# Patient Record
Sex: Male | Born: 1945 | Race: White | Hispanic: No | Marital: Married | Smoking: Never smoker
Health system: Southern US, Community
[De-identification: ages and names within clinical notes are randomized; demographics above are authoritative.]

## PROBLEM LIST (undated history)

## (undated) DIAGNOSIS — M779 Enthesopathy, unspecified: Secondary | ICD-10-CM

## (undated) DIAGNOSIS — R319 Hematuria, unspecified: Secondary | ICD-10-CM

## (undated) DIAGNOSIS — N503 Cyst of epididymis: Secondary | ICD-10-CM

## (undated) DIAGNOSIS — N200 Calculus of kidney: Secondary | ICD-10-CM

## (undated) DIAGNOSIS — G43909 Migraine, unspecified, not intractable, without status migrainosus: Secondary | ICD-10-CM

## (undated) DIAGNOSIS — E7800 Pure hypercholesterolemia, unspecified: Secondary | ICD-10-CM

## (undated) DIAGNOSIS — Z87442 Personal history of urinary calculi: Secondary | ICD-10-CM

## (undated) DIAGNOSIS — I77810 Thoracic aortic ectasia: Secondary | ICD-10-CM

## (undated) DIAGNOSIS — I499 Cardiac arrhythmia, unspecified: Secondary | ICD-10-CM

## (undated) DIAGNOSIS — I4821 Permanent atrial fibrillation: Secondary | ICD-10-CM

## (undated) DIAGNOSIS — E119 Type 2 diabetes mellitus without complications: Secondary | ICD-10-CM

## (undated) DIAGNOSIS — G459 Transient cerebral ischemic attack, unspecified: Secondary | ICD-10-CM

## (undated) DIAGNOSIS — I2510 Atherosclerotic heart disease of native coronary artery without angina pectoris: Secondary | ICD-10-CM

## (undated) DIAGNOSIS — I639 Cerebral infarction, unspecified: Secondary | ICD-10-CM

## (undated) DIAGNOSIS — F419 Anxiety disorder, unspecified: Secondary | ICD-10-CM

## (undated) DIAGNOSIS — I10 Essential (primary) hypertension: Secondary | ICD-10-CM

## (undated) DIAGNOSIS — N400 Enlarged prostate without lower urinary tract symptoms: Secondary | ICD-10-CM

## (undated) DIAGNOSIS — H409 Unspecified glaucoma: Secondary | ICD-10-CM

## (undated) HISTORY — DX: Unspecified glaucoma: H40.9

## (undated) HISTORY — DX: Cyst of epididymis: N50.3

## (undated) HISTORY — DX: Permanent atrial fibrillation: I48.21

## (undated) HISTORY — DX: Benign prostatic hyperplasia without lower urinary tract symptoms: N40.0

## (undated) HISTORY — PX: TONSILLECTOMY: SUR1361

## (undated) HISTORY — DX: Atherosclerotic heart disease of native coronary artery without angina pectoris: I25.10

## (undated) HISTORY — DX: Pure hypercholesterolemia, unspecified: E78.00

## (undated) HISTORY — DX: Essential (primary) hypertension: I10

## (undated) HISTORY — PX: CARDIOVERSION: SHX1299

## (undated) HISTORY — DX: Thoracic aortic ectasia: I77.810

## (undated) HISTORY — DX: Enthesopathy, unspecified: M77.9

---

## 1999-11-05 ENCOUNTER — Encounter: Admission: RE | Admit: 1999-11-05 | Discharge: 2000-02-03 | Payer: Self-pay | Admitting: *Deleted

## 2001-09-23 ENCOUNTER — Encounter: Payer: Self-pay | Admitting: *Deleted

## 2001-09-23 ENCOUNTER — Ambulatory Visit (HOSPITAL_COMMUNITY): Admission: RE | Admit: 2001-09-23 | Discharge: 2001-09-23 | Payer: Self-pay | Admitting: *Deleted

## 2001-12-15 ENCOUNTER — Encounter: Payer: Self-pay | Admitting: *Deleted

## 2001-12-15 ENCOUNTER — Ambulatory Visit (HOSPITAL_COMMUNITY): Admission: RE | Admit: 2001-12-15 | Discharge: 2001-12-15 | Payer: Self-pay | Admitting: *Deleted

## 2003-03-26 DIAGNOSIS — N503 Cyst of epididymis: Secondary | ICD-10-CM

## 2003-03-26 HISTORY — DX: Cyst of epididymis: N50.3

## 2003-06-20 ENCOUNTER — Ambulatory Visit (HOSPITAL_COMMUNITY): Admission: RE | Admit: 2003-06-20 | Discharge: 2003-06-20 | Payer: Self-pay | Admitting: Family Medicine

## 2003-09-28 ENCOUNTER — Ambulatory Visit (HOSPITAL_COMMUNITY): Admission: RE | Admit: 2003-09-28 | Discharge: 2003-09-28 | Payer: Self-pay | Admitting: Gastroenterology

## 2009-03-25 DIAGNOSIS — I2510 Atherosclerotic heart disease of native coronary artery without angina pectoris: Secondary | ICD-10-CM

## 2009-03-25 HISTORY — DX: Atherosclerotic heart disease of native coronary artery without angina pectoris: I25.10

## 2009-05-24 ENCOUNTER — Inpatient Hospital Stay (HOSPITAL_COMMUNITY): Admission: EM | Admit: 2009-05-24 | Discharge: 2009-05-26 | Payer: Self-pay | Admitting: Emergency Medicine

## 2009-05-26 ENCOUNTER — Encounter (INDEPENDENT_AMBULATORY_CARE_PROVIDER_SITE_OTHER): Payer: Self-pay | Admitting: Internal Medicine

## 2009-09-08 ENCOUNTER — Ambulatory Visit (HOSPITAL_COMMUNITY): Admission: RE | Admit: 2009-09-08 | Discharge: 2009-09-08 | Payer: Self-pay | Admitting: Cardiology

## 2010-06-10 LAB — BASIC METABOLIC PANEL
CO2: 25 mEq/L (ref 19–32)
GFR calc non Af Amer: >60 mL/min (ref >60)
Potassium: 4.1 mEq/L (ref 3.5–5.1)

## 2010-06-10 LAB — CBC
MCHC: 34.2 g/dL (ref 30.0–36.0)
Platelets: 213 10*3/uL (ref 150–400)
RDW: 14.4 % (ref 11.5–15.5)
WBC: 6.3 10*3/uL (ref 4.0–10.5)

## 2010-06-10 LAB — PROTIME-INR: Prothrombin Time: 27.7 seconds — ABNORMAL HIGH (ref 11.6–15.2)

## 2010-06-10 LAB — MAGNESIUM: Magnesium: 2 mg/dL (ref 1.5–2.5)

## 2010-06-18 LAB — DIFFERENTIAL
Basophils Absolute: 0 10*3/uL (ref 0.0–0.1)
Basophils Relative: 1 % (ref 0–1)
Eosinophils Absolute: 0.1 10*3/uL (ref 0.0–0.7)
Eosinophils Relative: 1 % (ref 0–5)
Monocytes Absolute: 1.1 10*3/uL — ABNORMAL HIGH (ref 0.1–1.0)
Monocytes Relative: 18 % — ABNORMAL HIGH (ref 3–12)

## 2010-06-18 LAB — LIPID PANEL
HDL: 28 mg/dL — ABNORMAL LOW (ref >39)
Triglycerides: 88 mg/dL (ref <150)
VLDL: 18 mg/dL (ref 0–40)

## 2010-06-18 LAB — CBC
HCT: 52.1 % — ABNORMAL HIGH (ref 39.0–52.0)
Hemoglobin: 17.7 g/dL — ABNORMAL HIGH (ref 13.0–17.0)
MCV: 91.3 fL (ref 78.0–100.0)
MCV: 91.5 fL (ref 78.0–100.0)
RBC: 5.3 MIL/uL (ref 4.22–5.81)
RBC: 5.7 MIL/uL (ref 4.22–5.81)
RDW: 13.5 % (ref 11.5–15.5)
RDW: 13.8 % (ref 11.5–15.5)
WBC: 5.8 10*3/uL (ref 4.0–10.5)
WBC: 6.1 10*3/uL (ref 4.0–10.5)

## 2010-06-18 LAB — GLUCOSE, CAPILLARY
Glucose-Capillary: 111 mg/dL — ABNORMAL HIGH (ref 70–99)
Glucose-Capillary: 111 mg/dL — ABNORMAL HIGH (ref 70–99)
Glucose-Capillary: 163 mg/dL — ABNORMAL HIGH (ref 70–99)
Glucose-Capillary: 241 mg/dL — ABNORMAL HIGH (ref 70–99)

## 2010-06-18 LAB — COMPREHENSIVE METABOLIC PANEL
ALT: 37 U/L (ref 0–53)
AST: 32 U/L (ref 0–37)
Albumin: 3.3 g/dL — ABNORMAL LOW (ref 3.5–5.2)
CO2: 28 mEq/L (ref 19–32)
Chloride: 101 mEq/L (ref 96–112)
Creatinine, Ser: 0.98 mg/dL (ref 0.4–1.5)
Sodium: 137 mEq/L (ref 135–145)
Total Bilirubin: 0.6 mg/dL (ref 0.3–1.2)
Total Protein: 6.3 g/dL (ref 6.0–8.3)

## 2010-06-18 LAB — PROTIME-INR
INR: 1.06 (ref 0.00–1.49)
INR: 1.36 (ref 0.00–1.49)

## 2010-06-18 LAB — URINALYSIS, ROUTINE W REFLEX MICROSCOPIC
Hgb urine dipstick: NEGATIVE
Ketones, ur: NEGATIVE mg/dL
pH: 7 (ref 5.0–8.0)

## 2010-06-18 LAB — CK TOTAL AND CKMB (NOT AT ARMC)
CK, MB: 1.1 ng/mL (ref 0.3–4.0)
Relative Index: 0.6 (ref 0.0–2.5)
Relative Index: 0.6 (ref 0.0–2.5)
Relative Index: 0.6 (ref 0.0–2.5)

## 2010-06-18 LAB — TSH: TSH: 1.816 u[IU]/mL (ref 0.350–4.500)

## 2010-06-18 LAB — POCT CARDIAC MARKERS
CKMB, poc: 1.7 ng/mL (ref 1.0–8.0)
Myoglobin, poc: 110 ng/mL (ref 12–200)

## 2010-06-18 LAB — BASIC METABOLIC PANEL
BUN: 15 mg/dL (ref 6–23)
GFR calc Af Amer: >60 mL/min (ref >60)
GFR calc non Af Amer: >60 mL/min (ref >60)
Glucose, Bld: 156 mg/dL — ABNORMAL HIGH (ref 70–99)
Potassium: 4.2 mEq/L (ref 3.5–5.1)
Sodium: 133 mEq/L — ABNORMAL LOW (ref 135–145)

## 2010-06-18 LAB — D-DIMER, QUANTITATIVE: D-Dimer, Quant: 1.58 ug/mL-FEU — ABNORMAL HIGH (ref 0.00–0.48)

## 2010-06-18 LAB — TROPONIN I
Troponin I: 0.01 ng/mL (ref 0.00–0.06)
Troponin I: 0.02 ng/mL (ref 0.00–0.06)
Troponin I: <0.01 ng/mL (ref 0.00–0.06)

## 2010-06-18 LAB — URINE CULTURE

## 2010-06-18 LAB — HEMOGLOBIN A1C
Hgb A1c MFr Bld: 8.2 % — ABNORMAL HIGH (ref 4.6–6.1)
Mean Plasma Glucose: 189 mg/dL

## 2010-08-10 NOTE — Op Note (Signed)
NAME:  Norman [name], Norman [name]                            ACCOUNT NO.:  [id number]   MEDICAL RECORD NO.:  [id number]                   PATIENT TYPE:  AMB   LOCATION:  ENDO                                 FACILITY:  [facility]   PHYSICIAN:  James L. Malon Kindle., M.D.          DATE OF BIRTH:  09-18-1945   DATE OF PROCEDURE:  09/28/2003  DATE OF DISCHARGE:                                 OPERATIVE REPORT   PROCEDURE:  Colonoscopy.   MEDICATIONS:  Fentanyl 87.5 mcg, Versed 9 mg IV.   SCOPE:  Olympus pediatric colonoscope.   INDICATIONS FOR PROCEDURE:  Colon cancer screening.   DESCRIPTION OF PROCEDURE:  The procedure had been explained to the patient  and consent obtained. With the patient in the left lateral decubitus  position, the Olympus scope was inserted and advanced under direct  visualization. The prep was excellent. We were able to reach the cecum  without difficulty. The ileocecal valve and appendiceal orifice were seen.  The scope was withdrawn and the cecum, ascending colon, transverse colon,  splenic flexure, descending and sigmoid colon were seen well upon removal.  No polyps or other lesions were seen. The scope was withdrawn,  the rectum  was free of polyps. The patient tolerated the procedure well and was  maintained on low flow oxygen and pulse oximeter throughout the procedure.   ASSESSMENT:  Normal screening colonoscopy without any polyps, V76.51.   PLAN:  Yearly Hemoccults and will recommend repeating procedure in 10 years.                                               James L. Malon Kindle., M.D.    Waldron Session  D:  09/28/2003  T:  09/28/2003  Job:  [id number]   cc:   Holley Bouche, M.D.  [address]  [place], [place] [address]  Fax: [phone]

## 2013-01-15 ENCOUNTER — Ambulatory Visit (INDEPENDENT_AMBULATORY_CARE_PROVIDER_SITE_OTHER): Payer: 59 | Admitting: Pharmacist

## 2013-01-15 DIAGNOSIS — I4891 Unspecified atrial fibrillation: Secondary | ICD-10-CM

## 2013-01-15 LAB — POCT INR: INR: 2.2

## 2013-02-26 ENCOUNTER — Ambulatory Visit (INDEPENDENT_AMBULATORY_CARE_PROVIDER_SITE_OTHER): Payer: 59 | Admitting: *Deleted

## 2013-02-26 DIAGNOSIS — I4891 Unspecified atrial fibrillation: Secondary | ICD-10-CM

## 2013-04-05 ENCOUNTER — Encounter: Payer: Self-pay | Admitting: General Surgery

## 2013-04-05 DIAGNOSIS — I10 Essential (primary) hypertension: Secondary | ICD-10-CM

## 2013-04-09 ENCOUNTER — Ambulatory Visit (INDEPENDENT_AMBULATORY_CARE_PROVIDER_SITE_OTHER): Payer: 59

## 2013-04-09 DIAGNOSIS — I4891 Unspecified atrial fibrillation: Secondary | ICD-10-CM

## 2013-04-09 LAB — POCT INR: INR: 2.9

## 2013-04-23 ENCOUNTER — Ambulatory Visit (INDEPENDENT_AMBULATORY_CARE_PROVIDER_SITE_OTHER): Payer: 59 | Admitting: Cardiology

## 2013-04-23 ENCOUNTER — Encounter: Payer: Self-pay | Admitting: Cardiology

## 2013-04-23 VITALS — BP 140/86 | HR 56 | Ht 72.0 in | Wt 246.0 lb

## 2013-04-23 DIAGNOSIS — I10 Essential (primary) hypertension: Secondary | ICD-10-CM

## 2013-04-23 DIAGNOSIS — I4891 Unspecified atrial fibrillation: Secondary | ICD-10-CM

## 2013-04-23 NOTE — Patient Instructions (Signed)
Your physician recommends that you continue on your current medications as directed. Please refer to the Current Medication list given to you today.  Your physician wants you to follow-up in: 6 Months with Dr Turner You will receive a reminder letter in the mail two months in advance. If you don't receive a letter, please call our office to schedule the follow-up appointment.  

## 2013-04-23 NOTE — Progress Notes (Signed)
  [address], [place], [place]  [address] Phone: [phone] Fax:  [phone]  Date:  04/23/2013   ID:  ELDRA WORD, DOB [birth year], MRN [id number]  PCP:  Gara Kroner, MD  Cardiologist:  Fransico Him, MD     History of Present Illness: Norman [name] is a 68 y.o. male with a history of PAF and HTN who presents today for followup.  He is doing well.  He denies any chest pain, SOB, DOE, LE edema, dizziness, palpitation or syncope.   Wt Readings from Last 3 Encounters:  04/23/13 246 lb (111.585 kg)     Past Medical History  Diagnosis Date  . Diabetes mellitus without complication   . Hypercholesteremia   . Glaucoma   . Hypertension   . Tendinitis     History in Rotator cuff  . Epididymal cyst 2005    Bilateral and small bilateral hydroceles with most recent ultra sound  . Arthritis of right ankle   . Atrial fibrillation     Dr Radford Pax  . BPH (benign prostatic hypertrophy)     Mild    Current Outpatient Prescriptions  Medication Sig Dispense Refill  . atorvastatin (LIPITOR) 80 MG tablet Take 80 mg by mouth daily.      . bimatoprost (LUMIGAN) 0.03 % ophthalmic solution 1 drop at bedtime.      Marland Kitchen diltiazem (DILACOR XR) 240 MG 24 hr capsule Take 240 mg by mouth daily.      . metFORMIN (GLUCOPHAGE) 500 MG tablet Take 500 mg by mouth daily with breakfast.      . metoprolol tartrate (LOPRESSOR) 25 MG tablet Take 25 mg by mouth 2 (two) times daily.      Marland Kitchen omega-3 acid ethyl esters (LOVAZA) 1 G capsule Take 1 g by mouth 2 (two) times daily.      Marland Kitchen warfarin (COUMADIN) 5 MG tablet Take 5 mg by mouth as directed. Per PharmD      . zolpidem (AMBIEN CR) 12.5 MG CR tablet Take 12.5 mg by mouth at bedtime as needed for sleep.       No current facility-administered medications for this visit.    Allergies:   No Known Allergies  Social History:  The patient  reports that he has never smoked. He does not have any smokeless tobacco history on file. He reports that he drinks  alcohol. He reports that he does not use illicit drugs.   Family History:  The patient's family history is not on file.   ROS:  Please see the history of present illness.      All other systems reviewed and negative.   PHYSICAL EXAM: VS:  BP 140/86  Pulse 56  Ht 6' (1.829 m)  Wt 246 lb (111.585 kg)  BMI 33.36 kg/m2 Well nourished, well developed, in no acute distress HEENT: normal Neck: no JVD Cardiac:  normal S1, S2; RRR; no murmur Lungs:  clear to auscultation bilaterally, no wheezing, rhonchi or rales Abd: soft, nontender, no hepatomegaly Ext: no edema Skin: warm and dry Neuro:  CNs 2-12 intact, no focal abnormalities noted  EKG:  Sinus bradycardia      ASSESSMENT AND PLAN:  1. PAF maintaining sinus bradycardia  - continue Diltiazem/metoprolol/warfarin 2. HTN well controlled  - continue Diltiazem/metoprolol  Followup with me in 6 months  Signed, Fransico Him, MD 04/23/2013 9:01 AM

## 2013-05-07 ENCOUNTER — Ambulatory Visit (INDEPENDENT_AMBULATORY_CARE_PROVIDER_SITE_OTHER): Payer: 59 | Admitting: *Deleted

## 2013-05-07 DIAGNOSIS — I4891 Unspecified atrial fibrillation: Secondary | ICD-10-CM

## 2013-05-07 LAB — POCT INR: INR: 2.7

## 2013-05-14 ENCOUNTER — Ambulatory Visit (INDEPENDENT_AMBULATORY_CARE_PROVIDER_SITE_OTHER): Payer: 59 | Admitting: *Deleted

## 2013-05-14 DIAGNOSIS — I4891 Unspecified atrial fibrillation: Secondary | ICD-10-CM

## 2013-05-14 LAB — POCT INR: INR: 2.2

## 2013-06-25 ENCOUNTER — Ambulatory Visit (INDEPENDENT_AMBULATORY_CARE_PROVIDER_SITE_OTHER): Payer: 59 | Admitting: *Deleted

## 2013-06-25 DIAGNOSIS — Z5181 Encounter for therapeutic drug level monitoring: Secondary | ICD-10-CM

## 2013-06-25 DIAGNOSIS — I4891 Unspecified atrial fibrillation: Secondary | ICD-10-CM

## 2013-06-25 LAB — POCT INR: INR: 1.9

## 2013-08-03 ENCOUNTER — Telehealth: Payer: Self-pay | Admitting: *Deleted

## 2013-08-03 NOTE — Telephone Encounter (Signed)
Patient requests coumadin refill be sent to [facility]. Thanks, [name]

## 2013-08-05 ENCOUNTER — Other Ambulatory Visit: Payer: Self-pay | Admitting: *Deleted

## 2013-08-05 MED ORDER — WARFARIN SODIUM 5 MG PO TABS: ORAL_TABLET | ORAL | Status: DC

## 2013-08-05 NOTE — Telephone Encounter (Signed)
Refill done as requested 

## 2013-08-05 NOTE — Telephone Encounter (Signed)
Done by Elbert Ewings, RN

## 2013-08-06 ENCOUNTER — Ambulatory Visit (INDEPENDENT_AMBULATORY_CARE_PROVIDER_SITE_OTHER): Payer: 59 | Admitting: *Deleted

## 2013-08-06 DIAGNOSIS — I4891 Unspecified atrial fibrillation: Secondary | ICD-10-CM

## 2013-08-06 DIAGNOSIS — Z5181 Encounter for therapeutic drug level monitoring: Secondary | ICD-10-CM

## 2013-08-06 LAB — POCT INR: INR: 2.5

## 2013-08-06 MED ORDER — WARFARIN SODIUM 5 MG PO TABS: ORAL_TABLET | ORAL | Status: DC

## 2013-10-12 ENCOUNTER — Telehealth: Payer: Self-pay | Admitting: Cardiology

## 2013-10-12 NOTE — Telephone Encounter (Signed)
Received request from Nurse fax box, documents faxed for surgical clearance. [name] Fax number: [phone] Attention: [phone]/km

## 2013-11-12 ENCOUNTER — Ambulatory Visit (INDEPENDENT_AMBULATORY_CARE_PROVIDER_SITE_OTHER): Payer: 59 | Admitting: *Deleted

## 2013-11-12 DIAGNOSIS — Z5181 Encounter for therapeutic drug level monitoring: Secondary | ICD-10-CM

## 2013-11-12 DIAGNOSIS — I4891 Unspecified atrial fibrillation: Secondary | ICD-10-CM

## 2013-11-12 LAB — POCT INR: INR: 2.1

## 2013-11-22 ENCOUNTER — Encounter: Payer: Self-pay | Admitting: Cardiology

## 2013-12-24 ENCOUNTER — Ambulatory Visit (INDEPENDENT_AMBULATORY_CARE_PROVIDER_SITE_OTHER): Payer: 59 | Admitting: *Deleted

## 2013-12-24 DIAGNOSIS — I4891 Unspecified atrial fibrillation: Secondary | ICD-10-CM

## 2013-12-24 DIAGNOSIS — Z5181 Encounter for therapeutic drug level monitoring: Secondary | ICD-10-CM

## 2013-12-24 LAB — POCT INR: INR: 2

## 2013-12-28 ENCOUNTER — Encounter (HOSPITAL_COMMUNITY): Payer: Self-pay | Admitting: Emergency Medicine

## 2013-12-28 ENCOUNTER — Inpatient Hospital Stay (HOSPITAL_COMMUNITY)
Admission: EM | Admit: 2013-12-28 | Discharge: 2013-12-29 | DRG: 065 | Disposition: A | Discharge status: Home / Self Care | Payer: Medicare Other | Attending: Internal Medicine | Admitting: Internal Medicine

## 2013-12-28 ENCOUNTER — Other Ambulatory Visit: Payer: Self-pay

## 2013-12-28 ENCOUNTER — Emergency Department (HOSPITAL_COMMUNITY): Payer: Medicare Other

## 2013-12-28 ENCOUNTER — Observation Stay (HOSPITAL_COMMUNITY): Payer: Medicare Other

## 2013-12-28 DIAGNOSIS — I63419 Cerebral infarction due to embolism of unspecified middle cerebral artery: Secondary | ICD-10-CM | POA: Diagnosis not present

## 2013-12-28 DIAGNOSIS — Z7901 Long term (current) use of anticoagulants: Secondary | ICD-10-CM

## 2013-12-28 DIAGNOSIS — Z79899 Other long term (current) drug therapy: Secondary | ICD-10-CM

## 2013-12-28 DIAGNOSIS — E669 Obesity, unspecified: Secondary | ICD-10-CM | POA: Diagnosis present

## 2013-12-28 DIAGNOSIS — E119 Type 2 diabetes mellitus without complications: Secondary | ICD-10-CM | POA: Diagnosis present

## 2013-12-28 DIAGNOSIS — I639 Cerebral infarction, unspecified: Secondary | ICD-10-CM | POA: Diagnosis present

## 2013-12-28 DIAGNOSIS — Z6833 Body mass index (BMI) 33.0-33.9, adult: Secondary | ICD-10-CM

## 2013-12-28 DIAGNOSIS — G458 Other transient cerebral ischemic attacks and related syndromes: Secondary | ICD-10-CM

## 2013-12-28 DIAGNOSIS — I4891 Unspecified atrial fibrillation: Secondary | ICD-10-CM | POA: Diagnosis present

## 2013-12-28 DIAGNOSIS — G451 Carotid artery syndrome (hemispheric): Secondary | ICD-10-CM

## 2013-12-28 DIAGNOSIS — N400 Enlarged prostate without lower urinary tract symptoms: Secondary | ICD-10-CM | POA: Diagnosis present

## 2013-12-28 DIAGNOSIS — I482 Chronic atrial fibrillation, unspecified: Secondary | ICD-10-CM

## 2013-12-28 DIAGNOSIS — I6309 Cerebral infarction due to thrombosis of other precerebral artery: Secondary | ICD-10-CM

## 2013-12-28 DIAGNOSIS — G459 Transient cerebral ischemic attack, unspecified: Secondary | ICD-10-CM

## 2013-12-28 DIAGNOSIS — R531 Weakness: Secondary | ICD-10-CM

## 2013-12-28 DIAGNOSIS — I10 Essential (primary) hypertension: Secondary | ICD-10-CM | POA: Diagnosis present

## 2013-12-28 DIAGNOSIS — D45 Polycythemia vera: Secondary | ICD-10-CM | POA: Diagnosis present

## 2013-12-28 DIAGNOSIS — G8194 Hemiplegia, unspecified affecting left nondominant side: Secondary | ICD-10-CM | POA: Diagnosis present

## 2013-12-28 DIAGNOSIS — G4733 Obstructive sleep apnea (adult) (pediatric): Secondary | ICD-10-CM | POA: Diagnosis present

## 2013-12-28 DIAGNOSIS — E780 Pure hypercholesterolemia: Secondary | ICD-10-CM | POA: Diagnosis present

## 2013-12-28 HISTORY — DX: Transient cerebral ischemic attack, unspecified: G45.9

## 2013-12-28 HISTORY — DX: Type 2 diabetes mellitus without complications: E11.9

## 2013-12-28 HISTORY — DX: Migraine, unspecified, not intractable, without status migrainosus: G43.909

## 2013-12-28 HISTORY — DX: Calculus of kidney: N20.0

## 2013-12-28 LAB — COMPREHENSIVE METABOLIC PANEL
ALT: 22 U/L (ref 0–53)
ANION GAP: 11 (ref 5–15)
AST: 21 U/L (ref 0–37)
Albumin: 3.6 g/dL (ref 3.5–5.2)
Alkaline Phosphatase: 103 U/L (ref 39–117)
BUN: 23 mg/dL (ref 6–23)
CALCIUM: 9.5 mg/dL (ref 8.4–10.5)
CO2: 25 mEq/L (ref 19–32)
Chloride: 103 mEq/L (ref 96–112)
Creatinine, Ser: 0.98 mg/dL (ref 0.50–1.35)
GFR calc Af Amer: >90 mL/min (ref >90)
GFR calc non Af Amer: 81 mL/min — ABNORMAL LOW (ref >90)
Glucose, Bld: 111 mg/dL — ABNORMAL HIGH (ref 70–99)
Potassium: 4.8 mEq/L (ref 3.7–5.3)
Sodium: 139 mEq/L (ref 137–147)
Total Bilirubin: 0.4 mg/dL (ref 0.3–1.2)
Total Protein: 7 g/dL (ref 6.0–8.3)

## 2013-12-28 LAB — URINALYSIS, ROUTINE W REFLEX MICROSCOPIC
BILIRUBIN URINE: NEGATIVE
Glucose, UA: NEGATIVE mg/dL
Hgb urine dipstick: NEGATIVE
Ketones, ur: NEGATIVE mg/dL
Leukocytes, UA: NEGATIVE
Nitrite: NEGATIVE
PROTEIN: NEGATIVE mg/dL
SPECIFIC GRAVITY, URINE: 1.012 (ref 1.005–1.030)
UROBILINOGEN UA: 0.2 mg/dL (ref 0.0–1.0)
pH: 7 (ref 5.0–8.0)

## 2013-12-28 LAB — CBC
HEMATOCRIT: 50 % (ref 39.0–52.0)
Hemoglobin: 17.3 g/dL — ABNORMAL HIGH (ref 13.0–17.0)
MCH: 31.3 pg (ref 26.0–34.0)
MCHC: 34.6 g/dL (ref 30.0–36.0)
MCV: 90.4 fL (ref 78.0–100.0)
Platelets: 261 10*3/uL (ref 150–400)
RBC: 5.53 MIL/uL (ref 4.22–5.81)
RDW: 13 % (ref 11.5–15.5)
WBC: 9.1 10*3/uL (ref 4.0–10.5)

## 2013-12-28 LAB — DIFFERENTIAL
BASOS ABS: 0 10*3/uL (ref 0.0–0.1)
Basophils Relative: 0 % (ref 0–1)
EOS ABS: 0.1 10*3/uL (ref 0.0–0.7)
Eosinophils Relative: 1 % (ref 0–5)
Lymphocytes Relative: 27 % (ref 12–46)
Lymphs Abs: 2.5 10*3/uL (ref 0.7–4.0)
Monocytes Absolute: 0.9 10*3/uL (ref 0.1–1.0)
Monocytes Relative: 10 % (ref 3–12)
Neutro Abs: 5.6 10*3/uL (ref 1.7–7.7)
Neutrophils Relative %: 62 % (ref 43–77)

## 2013-12-28 LAB — I-STAT CHEM 8, ED
BUN: 28 mg/dL — ABNORMAL HIGH (ref 6–23)
Calcium, Ion: 1.15 mmol/L (ref 1.13–1.30)
Chloride: 107 mEq/L (ref 96–112)
Creatinine, Ser: 1 mg/dL (ref 0.50–1.35)
Glucose, Bld: 115 mg/dL — ABNORMAL HIGH (ref 70–99)
HEMATOCRIT: 54 % — AB (ref 39.0–52.0)
Hemoglobin: 18.4 g/dL — ABNORMAL HIGH (ref 13.0–17.0)
Potassium: 4.9 mEq/L (ref 3.7–5.3)
SODIUM: 138 meq/L (ref 137–147)
TCO2: 25 mmol/L (ref 0–100)

## 2013-12-28 LAB — RAPID URINE DRUG SCREEN, HOSP PERFORMED
AMPHETAMINES: NOT DETECTED
Barbiturates: NOT DETECTED
Benzodiazepines: NOT DETECTED
COCAINE: NOT DETECTED
Opiates: NOT DETECTED
TETRAHYDROCANNABINOL: NOT DETECTED

## 2013-12-28 LAB — PROTIME-INR
INR: 2.39 — ABNORMAL HIGH (ref 0.00–1.49)
Prothrombin Time: 26.1 seconds — ABNORMAL HIGH (ref 11.6–15.2)

## 2013-12-28 LAB — I-STAT TROPONIN, ED: TROPONIN I, POC: 0 ng/mL (ref 0.00–0.08)

## 2013-12-28 LAB — APTT: APTT: 44 s — AB (ref 24–37)

## 2013-12-28 LAB — GLUCOSE, CAPILLARY: Glucose-Capillary: 123 mg/dL — ABNORMAL HIGH (ref 70–99)

## 2013-12-28 LAB — ETHANOL

## 2013-12-28 MED ORDER — STROKE: EARLY STAGES OF RECOVERY BOOK
Freq: Once | Status: DC
  Filled 2013-12-28: qty 1

## 2013-12-28 MED ORDER — LATANOPROST 0.005 % OP SOLN
1.0000 [drp] | Freq: Every day | OPHTHALMIC | Status: DC
  Administered 2013-12-28: 1 [drp] via OPHTHALMIC
  Filled 2013-12-28: qty 2.5

## 2013-12-28 MED ORDER — INSULIN ASPART 100 UNIT/ML ~~LOC~~ SOLN: 0.0000 [IU] | Freq: Three times a day (TID) | SUBCUTANEOUS | Status: DC

## 2013-12-28 MED ORDER — SODIUM CHLORIDE 0.9 % IV BOLUS (SEPSIS)
500.0000 mL | Freq: Once | INTRAVENOUS | Status: AC
  Administered 2013-12-28: 500 mL via INTRAVENOUS

## 2013-12-28 MED ORDER — BRIMONIDINE TARTRATE 0.2 % OP SOLN
1.0000 [drp] | Freq: Two times a day (BID) | OPHTHALMIC | Status: DC
  Administered 2013-12-28 – 2013-12-29 (×2): 1 [drp] via OPHTHALMIC
  Filled 2013-12-28: qty 5

## 2013-12-28 MED ORDER — DILTIAZEM HCL ER 240 MG PO CP24
240.0000 mg | ORAL_CAPSULE | Freq: Every day | ORAL | Status: DC
  Administered 2013-12-29: 240 mg via ORAL
  Filled 2013-12-28: qty 1

## 2013-12-28 MED ORDER — TIMOLOL MALEATE 0.5 % OP SOLN
1.0000 [drp] | Freq: Two times a day (BID) | OPHTHALMIC | Status: DC
  Administered 2013-12-28 – 2013-12-29 (×2): 1 [drp] via OPHTHALMIC
  Filled 2013-12-28: qty 5

## 2013-12-28 MED ORDER — WARFARIN SODIUM 2.5 MG PO TABS: 2.5000 mg | ORAL_TABLET | Freq: Every day | ORAL | Status: DC

## 2013-12-28 MED ORDER — WARFARIN SODIUM 2.5 MG PO TABS: 2.5000 mg | ORAL_TABLET | ORAL | Status: DC

## 2013-12-28 MED ORDER — WARFARIN - PHYSICIAN DOSING INPATIENT: Freq: Every day | Status: DC

## 2013-12-28 MED ORDER — ATORVASTATIN CALCIUM 80 MG PO TABS
80.0000 mg | ORAL_TABLET | Freq: Every day | ORAL | Status: DC
  Administered 2013-12-28: 80 mg via ORAL
  Filled 2013-12-28 (×2): qty 1

## 2013-12-28 MED ORDER — OMEGA-3-ACID ETHYL ESTERS 1 G PO CAPS
1.0000 g | ORAL_CAPSULE | Freq: Two times a day (BID) | ORAL | Status: DC
  Administered 2013-12-28 – 2013-12-29 (×2): 1 g via ORAL
  Filled 2013-12-28 (×3): qty 1

## 2013-12-28 MED ORDER — SODIUM CHLORIDE 0.9 % IV SOLN
INTRAVENOUS | Status: DC
  Administered 2013-12-28: 1000 mL via INTRAVENOUS
  Administered 2013-12-28: 100 mL/h via INTRAVENOUS
  Administered 2013-12-28: 22:00:00 via INTRAVENOUS
  Administered 2013-12-29: 1000 mL via INTRAVENOUS

## 2013-12-28 MED ORDER — METOPROLOL TARTRATE 25 MG PO TABS
25.0000 mg | ORAL_TABLET | Freq: Two times a day (BID) | ORAL | Status: DC
  Administered 2013-12-28 – 2013-12-29 (×2): 25 mg via ORAL
  Filled 2013-12-28 (×3): qty 1

## 2013-12-28 MED ORDER — BRIMONIDINE TARTRATE-TIMOLOL 0.2-0.5 % OP SOLN
1.0000 [drp] | Freq: Two times a day (BID) | OPHTHALMIC | Status: DC
  Filled 2013-12-28: qty 5

## 2013-12-28 MED ORDER — WARFARIN SODIUM 5 MG PO TABS
5.0000 mg | ORAL_TABLET | ORAL | Status: DC
  Administered 2013-12-28: 5 mg via ORAL
  Filled 2013-12-28 (×2): qty 1

## 2013-12-28 NOTE — ED Notes (Signed)
Admitting MD at the bedside.  

## 2013-12-28 NOTE — Consult Note (Signed)
Referring Physician: Daleen Bo    Chief Complaint: left arm and leg weakness  HPI:                                                                                                                                         Norman [name] is an 68 y.o. male who went to sleep at 11:30 PM last night feeling well. He awoke at 0500 hours and when he tried to get out of bed he noted his left arm felt "rubbery and was unable to put weight on his left leg". This lasted for about one hour and before he felt it returned to baseline. Patient was brought to ED by wife concerned he suffered a stroke. He feels hs symptoms have resolved at this time.   Of note: Admitting labs show a Hg 18.4 and Hct 54  Date last known well: Date: 12/27/2013 Time last known well: Time: 23:30 tPA Given: No: resolving symptoms and INR 2.3  Past Medical History  Diagnosis Date  . Diabetes mellitus without complication   . Hypercholesteremia   . Glaucoma   . Hypertension   . Tendinitis     History in Rotator cuff  . Epididymal cyst 2005    Bilateral and small bilateral hydroceles with most recent ultra sound  . Arthritis of right ankle   . Atrial fibrillation     Dr Radford Pax  . BPH (benign prostatic hypertrophy)     Mild    History reviewed. No pertinent past surgical history.  History reviewed. No pertinent family history. Social History:  reports that he has never smoked. He does not have any smokeless tobacco history on file. He reports that he drinks alcohol. He reports that he does not use illicit drugs.  Allergies: No Known Allergies  Medications:                                                                                                                           No current facility-administered medications for this encounter.   Current Outpatient Prescriptions  Medication Sig Dispense Refill  . atorvastatin (LIPITOR) 80 MG tablet Take 80 mg by mouth daily.      . bimatoprost (LUMIGAN) 0.03 % ophthalmic  solution Place 1 drop into both eyes at bedtime.       . brimonidine-timolol (COMBIGAN) 0.2-0.5 % ophthalmic solution Place 1 drop  into both eyes every 12 (twelve) hours.      Marland Kitchen CINNAMON PO Take 1 capsule by mouth 2 (two) times daily.      Marland Kitchen diltiazem (DILACOR XR) 240 MG 24 hr capsule Take 240 mg by mouth daily.      . metFORMIN (GLUCOPHAGE) 500 MG tablet Take 500 mg by mouth daily with breakfast.      . metoprolol tartrate (LOPRESSOR) 25 MG tablet Take 25 mg by mouth 2 (two) times daily.      . Omega 3 1000 MG CAPS Take 1,000 mg by mouth 2 (two) times daily.      Marland Kitchen omega-3 acid ethyl esters (LOVAZA) 1 G capsule Take 1 g by mouth 2 (two) times daily.      Marland Kitchen warfarin (COUMADIN) 5 MG tablet Take 2.5-5 mg by mouth daily. 1/2 tablet (2.5 mg) on Mondays, Thursdays, and Saturdays; 1 tablet (5 mg) on all other days.      Marland Kitchen zolpidem (AMBIEN CR) 12.5 MG CR tablet Take 12.5 mg by mouth at bedtime as needed for sleep.         ROS:                                                                                                                                       History obtained from the patient  General ROS: negative for - chills, fatigue, fever, night sweats, weight gain or weight loss Psychological ROS: negative for - behavioral disorder, hallucinations, memory difficulties, mood swings or suicidal ideation Ophthalmic ROS: negative for - blurry vision, double vision, eye pain or loss of vision ENT ROS: negative for - epistaxis, nasal discharge, oral lesions, sore throat, tinnitus or vertigo Allergy and Immunology ROS: negative for - hives or itchy/watery eyes Hematological and Lymphatic ROS: negative for - bleeding problems, bruising or swollen lymph nodes Endocrine ROS: negative for - galactorrhea, hair pattern changes, polydipsia/polyuria or temperature intolerance Respiratory ROS: negative for - cough, hemoptysis, shortness of breath or wheezing Cardiovascular ROS: negative for - chest pain,  dyspnea on exertion, edema or irregular heartbeat Gastrointestinal ROS: negative for - abdominal pain, diarrhea, hematemesis, nausea/vomiting or stool incontinence Genito-Urinary ROS: negative for - dysuria, hematuria, incontinence or urinary frequency/urgency Musculoskeletal ROS: negative for - joint swelling or muscular weakness Neurological ROS: as noted in HPI Dermatological ROS: negative for rash and skin lesion changes  Neurologic Examination:  Blood pressure 135/67, pulse 68, temperature 97.8 F (36.6 C), temperature source Oral, resp. rate 18, SpO2 95.00%.   General: NAD Mental Status: Alert, oriented, thought content appropriate.  Speech fluent without evidence of aphasia.  Able to follow 3 step commands without difficulty. Cranial Nerves: II: Discs flat bilaterally; Visual fields grossly normal, pupils equal, round, reactive to light and accommodation III,IV, VI: ptosis not present, extra-ocular motions intact bilaterally V,VII: smile symmetric, facial light touch sensation normal bilaterally VIII: hearing normal bilaterally IX,X: gag reflex present XI: bilateral shoulder shrug XII: midline tongue extension without atrophy or fasciculations  Motor: Right : Upper extremity   5/5    Left:     Upper extremity   4/5  Lower extremity   5/5     Lower extremity   4/5 --pronator drift noted on the left UE Tone and bulk:normal tone throughout; no atrophy noted Sensory: Pinprick and light touch intact throughout, bilaterally Deep Tendon Reflexes:  Right: Upper Extremity   Left: Upper extremity   biceps (C-5 to C-6) 2/4   biceps (C-5 to C-6) 2/4 tricep (C7) 2/4    triceps (C7) 2/4 Brachioradialis (C6) 2/4  Brachioradialis (C6) 2/4  Lower Extremity Lower Extremity  quadriceps (L-2 to L-4) 2/4   quadriceps (L-2 to L-4) 2/4 Achilles (S1) 1/4   Achilles (S1) 1/4  Plantars: Right:  downgoing   Left: downgoing Cerebellar: Dysmetria left finger-to-nose,  Mild dysmetria left heel-to-shin test Gait: stable with no drifting or abnormal gait.  CV: pulses palpable throughout    Lab Results: Basic Metabolic Panel:  Recent Labs Lab 12/28/13 1004 12/28/13 1018  NA 139 138  K 4.8 4.9  CL 103 107  CO2 25  --   GLUCOSE 111* 115*  BUN 23 28*  CREATININE 0.98 1.00  CALCIUM 9.5  --     Liver Function Tests:  Recent Labs Lab 12/28/13 1004  AST 21  ALT 22  ALKPHOS 103  BILITOT 0.4  PROT 7.0  ALBUMIN 3.6   No results found for this basename: LIPASE, AMYLASE,  in the last 168 hours No results found for this basename: AMMONIA,  in the last 168 hours  CBC:  Recent Labs Lab 12/28/13 1004 12/28/13 1018  WBC 9.1  --   NEUTROABS 5.6  --   HGB 17.3* 18.4*  HCT 50.0 54.0*  MCV 90.4  --   PLT 261  --     Cardiac Enzymes: No results found for this basename: CKTOTAL, CKMB, CKMBINDEX, TROPONINI,  in the last 168 hours  Lipid Panel: No results found for this basename: CHOL, TRIG, HDL, CHOLHDL, VLDL, LDLCALC,  in the last 168 hours  CBG: No results found for this basename: GLUCAP,  in the last 168 hours  Microbiology: Results for orders placed during the hospital encounter of 05/24/09  URINE CULTURE     Status: None   Collection Time    05/24/09  3:39 PM      Result Value Ref Range Status   Specimen Description URINE, RANDOM   Final   Special Requests NONE   Final   Colony Count 7,000 COLONIES/ML   Final   Culture INSIGNIFICANT GROWTH   Final   Report Status 05/26/2009 FINAL   Final    Coagulation Studies:  Recent Labs  12/28/13 1004  LABPROT 26.1*  INR 2.39*    Imaging: Ct Head Wo Contrast  12/28/2013   CLINICAL DATA:  Transient left-sided weakness upon awakening earlier today ; partial resolution since earlier  in the day  EXAM: CT HEAD WITHOUT CONTRAST  TECHNIQUE: Contiguous axial images were obtained from the base of the skull through the  vertex without intravenous contrast.  COMPARISON:  None.  FINDINGS: There is age related volume loss. There is no demonstrable mass, hemorrhage, extra-axial fluid collection, or midline shift. There is minimal small vessel disease in the centra semiovale bilaterally. Elsewhere gray-white compartments appear normal. There is no demonstrable acute infarct. Bony calvarium appears intact. The mastoid air cells are clear. Each middle cerebral artery shows some increased attenuation ; this finding is more notable on the right than on the left. Significance of this finding is uncertain, particularly given the history of resolving weakness on the left.  IMPRESSION: No intracranial mass or hemorrhage. No acute appearing infarct demonstrable. Slight small vessel disease in the periventricular white matter. Age related volume loss is present. There is slightly more notable increased attenuation in the right middle cerebral artery compared to the left. This finding has been associated with early infarct in the right middle cerebral artery distribution. The significance of this finding in light of the clinical symptoms, however, must be viewed as questionable.   Electronically Signed   By: Lowella Grip M.D.   On: 12/28/2013 11:44       Assessment and plan discussed with with attending physician and they are in agreement.    Etta Quill PA-C [facility] [phone]  12/28/2013, 12:51 PM   Assessment: 68 y.o. male who awoke with left arm and leg weakness.  Symptoms resolved within one hour.  Current exam shows only left UE pronator drift. CT head negative for acute infarct or bleed. Patient currently on coumadin with therapeutic INR. Likely does have stroke with left sided deficits. His symptoms are mild and he is outside the window for intervention.   Stroke Risk Factors - atrial fibrillation, diabetes mellitus, hyperlipidemia and hypertension  1. HgbA1c, fasting lipid panel 2. MRI, MRA  of the  brain without contrast 3. PT consult, OT consult, Speech consult 4. Echocardiogram 5. Carotid dopplers 6. Prophylactic therapy-coumadin 7. Risk factor modification 8. Telemetry monitoring 9. Frequent neuro checks  Roland Rack, MD [facility] [phone]  If 7pm- 7am, please page neurology on call as listed in [place].

## 2013-12-28 NOTE — ED Notes (Signed)
Pt c/o left sided weakness upon waking that is now resolving; pt sts hx of afib and feels like may have irregular HR today; pt on coumadin; pt denies pain; pt sts some dizziness today

## 2013-12-28 NOTE — H&P (Signed)
[facility] History and Physical  WHALEN TROMPETER KDT:[id number] DOB: Oct 21, 1945 DOA: 12/28/2013  Referring physician:  PCP: Gara Kroner, MD  Specialists:   Chief Complaint: L sided weakness   HPI: Norman [name] is a 68 y.o. male with PMH of HTN, DM, A fib on AC presented with L sided weakness; Patient reports that he awakened at 5 AM with L sided weakness, he could not walk because of the weakness. His symptoms have resolved within 1 hour. He denies paraesthesia, no change in speech, or hearing, no vertigo, denies having similar symptoms in the past; denies chest pain, no SOB, no cough, no fever, no nausea, vomiting    Review of Systems: The patient denies anorexia, fever, weight loss,, vision loss, decreased hearing, hoarseness, chest pain, syncope, dyspnea on exertion, peripheral edema, balance deficits, hemoptysis, abdominal pain, melena, hematochezia, severe indigestion/heartburn, hematuria, incontinence, genital sores, muscle weakness, suspicious skin lesions, transient blindness, difficulty walking, depression, unusual weight change, abnormal bleeding, enlarged lymph nodes, angioedema, and breast masses.    Past Medical History  Diagnosis Date  . Diabetes mellitus without complication   . Hypercholesteremia   . Glaucoma   . Hypertension   . Tendinitis     History in Rotator cuff  . Epididymal cyst 2005    Bilateral and small bilateral hydroceles with most recent ultra sound  . Arthritis of right ankle   . Atrial fibrillation     Dr Radford Pax  . BPH (benign prostatic hypertrophy)     Mild   History reviewed. No pertinent past surgical history. Social History:  reports that he has never smoked. He does not have any smokeless tobacco history on file. He reports that he drinks alcohol. He reports that he does not use illicit drugs. where does patient live--home, ALF, SNF? and with whom if at home? Yes;  Can patient participate in ADLs?  No Known Allergies  History reviewed.  No pertinent family history.  (be sure to complete)  Prior to Admission medications   Medication Sig Start Date End Date Taking? Authorizing Provider  atorvastatin (LIPITOR) 80 MG tablet Take 80 mg by mouth daily.   Yes Historical Provider, MD  bimatoprost (LUMIGAN) 0.03 % ophthalmic solution Place 1 drop into both eyes at bedtime.    Yes Historical Provider, MD  brimonidine-timolol (COMBIGAN) 0.2-0.5 % ophthalmic solution Place 1 drop into both eyes every 12 (twelve) hours.   Yes Historical Provider, MD  CINNAMON PO Take 1 capsule by mouth 2 (two) times daily.   Yes Historical Provider, MD  diltiazem (DILACOR XR) 240 MG 24 hr capsule Take 240 mg by mouth daily.   Yes Historical Provider, MD  metFORMIN (GLUCOPHAGE) 500 MG tablet Take 500 mg by mouth daily with breakfast.   Yes Historical Provider, MD  metoprolol tartrate (LOPRESSOR) 25 MG tablet Take 25 mg by mouth 2 (two) times daily.   Yes Historical Provider, MD  Omega 3 1000 MG CAPS Take 1,000 mg by mouth 2 (two) times daily.   Yes Historical Provider, MD  omega-3 acid ethyl esters (LOVAZA) 1 G capsule Take 1 g by mouth 2 (two) times daily.   Yes Historical Provider, MD  warfarin (COUMADIN) 5 MG tablet Take 2.5-5 mg by mouth daily. 1/2 tablet (2.5 mg) on Mondays, Thursdays, and Saturdays; 1 tablet (5 mg) on all other days.   Yes Historical Provider, MD  zolpidem (AMBIEN CR) 12.5 MG CR tablet Take 12.5 mg by mouth at bedtime as needed for sleep.   Yes  Historical Provider, MD   Physical Exam: Filed Vitals:   12/28/13 1228  BP: 132/73  Pulse:   Temp:   Resp: 16     General:  alert  Eyes: eom-i, conjunctival erythema   ENT: no oral ulcers   Neck: supple   Cardiovascular: s1,s2 irregular   Respiratory: CTA BL  Abdomen: soft, nt,nd   Skin: no rash   Musculoskeletal: no LE edeam  Psychiatric: no hallucinations   Neurologic: AAAx3; CN 2-12 intact; motor 5/5 BL  Labs on Admission:  Basic Metabolic Panel:  Recent  Labs Lab 12/28/13 1004 12/28/13 1018  NA 139 138  K 4.8 4.9  CL 103 107  CO2 25  --   GLUCOSE 111* 115*  BUN 23 28*  CREATININE 0.98 1.00  CALCIUM 9.5  --    Liver Function Tests:  Recent Labs Lab 12/28/13 1004  AST 21  ALT 22  ALKPHOS 103  BILITOT 0.4  PROT 7.0  ALBUMIN 3.6   No results found for this basename: LIPASE, AMYLASE,  in the last 168 hours No results found for this basename: AMMONIA,  in the last 168 hours CBC:  Recent Labs Lab 12/28/13 1004 12/28/13 1018  WBC 9.1  --   NEUTROABS 5.6  --   HGB 17.3* 18.4*  HCT 50.0 54.0*  MCV 90.4  --   PLT 261  --    Cardiac Enzymes: No results found for this basename: CKTOTAL, CKMB, CKMBINDEX, TROPONINI,  in the last 168 hours  BNP (last 3 results) No results found for this basename: PROBNP,  in the last 8760 hours CBG: No results found for this basename: GLUCAP,  in the last 168 hours  Radiological Exams on Admission: Ct Head Wo Contrast  12/28/2013   CLINICAL DATA:  Transient left-sided weakness upon awakening earlier today ; partial resolution since earlier in the day  EXAM: CT HEAD WITHOUT CONTRAST  TECHNIQUE: Contiguous axial images were obtained from the base of the skull through the vertex without intravenous contrast.  COMPARISON:  None.  FINDINGS: There is age related volume loss. There is no demonstrable mass, hemorrhage, extra-axial fluid collection, or midline shift. There is minimal small vessel disease in the centra semiovale bilaterally. Elsewhere gray-white compartments appear normal. There is no demonstrable acute infarct. Bony calvarium appears intact. The mastoid air cells are clear. Each middle cerebral artery shows some increased attenuation ; this finding is more notable on the right than on the left. Significance of this finding is uncertain, particularly given the history of resolving weakness on the left.  IMPRESSION: No intracranial mass or hemorrhage. No acute appearing infarct demonstrable.  Slight small vessel disease in the periventricular white matter. Age related volume loss is present. There is slightly more notable increased attenuation in the right middle cerebral artery compared to the left. This finding has been associated with early infarct in the right middle cerebral artery distribution. The significance of this finding in light of the clinical symptoms, however, must be viewed as questionable.   Electronically Signed   By: Lowella Grip M.D.   On: 12/28/2013 11:44    EKG: Independently reviewed.   Assessment/Plan Principal Problem:   TIA (transient ischemic attack) Active Problems:   Atrial fibrillation   Essential hypertension, benign   68 y.o. male with PMH of HTN, A fib on AC presented with L sided weakness  1. L sided transient hemiplegia-resolved; TIA vs CVA with a fib; but INR therapeutic -CT:  increased attenuation in the right  middle cerebral artery compared to the left. This finding has been associated with early infarct in the right middle cerebral artery distribution  -neuro exam non focal;  -obtain TIA/CVA work up; neurology consultation; check jack 2, epo r/o polycytemia   2. HTN; hold BP meds with suspected TIA 3. DM, check HA1c; hold metformin;cont ISS 4. HPL, on statin  5. Elevated Hg/Hct of unclear etiology; no h/o tobacco use; check EPO, Jack 2; may need outpatient BM biopsy 6. A fib; HR controlled; cont Cardizem, BB, coumadin;    Neurology; if consultant consulted, please document name and whether formally or informally consulted  Code Status:  full (must indicate code status--if unknown or must be presumed, indicate so) Family Communication: d/wpatient, his wife (indicate person spoken with, if applicable, with phone number if by telephone) Disposition Plan: home pen clinical improvement   (indicate anticipated LOS)  Time spent: >35 minutes   Kinnie Feil [facility] Pager [phone]  If 7PM-7AM, please contact  night-coverage [email or web address] Password [email or web address] 12/28/2013, 12:31 PM

## 2013-12-28 NOTE — ED Notes (Signed)
LSN: [id number] 12/27/13

## 2013-12-28 NOTE — ED Notes (Signed)
Patient ordered tray

## 2013-12-28 NOTE — ED Provider Notes (Addendum)
CSN: [id number]     Arrival date & time 12/28/13  8527 History   First MD Initiated Contact with Patient 12/28/13 [phone]     Chief Complaint  Patient presents with  . Weakness  . Transient Ischemic Attack     (Consider location/radiation/quality/duration/timing/severity/associated sxs/prior Treatment) HPI Comments: Patient presents to ER for evaluation of left-sided weakness. Patient reports that he awakened at 5 AM, tried to get out of bed and noticed that his left arm and left leg were not working properly. He reports that his leg would give out on him. He could not walk because of the weakness. Patient does report that his symptoms have progressively improved since he first noticed.  Patient is a 68 y.o. male presenting with weakness.  Weakness    Past Medical History  Diagnosis Date  . Diabetes mellitus without complication   . Hypercholesteremia   . Glaucoma   . Hypertension   . Tendinitis     History in Rotator cuff  . Epididymal cyst 2005    Bilateral and small bilateral hydroceles with most recent ultra sound  . Arthritis of right ankle   . Atrial fibrillation     Dr Radford Pax  . BPH (benign prostatic hypertrophy)     Mild   History reviewed. No pertinent past surgical history. History reviewed. No pertinent family history. History  Substance Use Topics  . Smoking status: Never Smoker   . Smokeless tobacco: Not on file  . Alcohol Use: Yes     Comment: occasionally 2 glasses of wine a month    Review of Systems  Neurological: Positive for weakness (Left arm and leg).  All other systems reviewed and are negative.     Allergies  Review of patient's allergies indicates no known allergies.  Home Medications   Prior to Admission medications   Medication Sig Start Date End Date Taking? Authorizing Provider  atorvastatin (LIPITOR) 80 MG tablet Take 80 mg by mouth daily.   Yes Historical Provider, MD  bimatoprost (LUMIGAN) 0.03 % ophthalmic solution Place 1 drop  into both eyes at bedtime.    Yes Historical Provider, MD  brimonidine-timolol (COMBIGAN) 0.2-0.5 % ophthalmic solution Place 1 drop into both eyes every 12 (twelve) hours.   Yes Historical Provider, MD  CINNAMON PO Take 1 capsule by mouth 2 (two) times daily.   Yes Historical Provider, MD  diltiazem (DILACOR XR) 240 MG 24 hr capsule Take 240 mg by mouth daily.   Yes Historical Provider, MD  metFORMIN (GLUCOPHAGE) 500 MG tablet Take 500 mg by mouth daily with breakfast.   Yes Historical Provider, MD  metoprolol tartrate (LOPRESSOR) 25 MG tablet Take 25 mg by mouth 2 (two) times daily.   Yes Historical Provider, MD  Omega 3 1000 MG CAPS Take 1,000 mg by mouth 2 (two) times daily.   Yes Historical Provider, MD  omega-3 acid ethyl esters (LOVAZA) 1 G capsule Take 1 g by mouth 2 (two) times daily.   Yes Historical Provider, MD  warfarin (COUMADIN) 5 MG tablet Take 2.5-5 mg by mouth daily. 1/2 tablet (2.5 mg) on Mondays, Thursdays, and Saturdays; 1 tablet (5 mg) on all other days.   Yes Historical Provider, MD  zolpidem (AMBIEN CR) 12.5 MG CR tablet Take 12.5 mg by mouth at bedtime as needed for sleep.   Yes Historical Provider, MD   BP 118/77  Pulse 66  Temp(Src) 97.8 F (36.6 C) (Oral)  Resp 19  SpO2 98% Physical Exam  Constitutional: He is  oriented to person, place, and time. He appears well-developed and well-nourished. No distress.  HENT:  Head: Normocephalic and atraumatic.  Right Ear: Hearing normal.  Left Ear: Hearing normal.  Nose: Nose normal.  Mouth/Throat: Oropharynx is clear and moist and mucous membranes are normal.  Eyes: Conjunctivae and EOM are normal. Pupils are equal, round, and reactive to light.  Neck: Normal range of motion. Neck supple.  Cardiovascular: Regular rhythm, S1 normal and S2 normal.  Exam reveals no gallop and no friction rub.   No murmur heard. Pulmonary/Chest: Effort normal and breath sounds normal. No respiratory distress. He exhibits no tenderness.   Abdominal: Soft. Normal appearance and bowel sounds are normal. There is no hepatosplenomegaly. There is no tenderness. There is no rebound, no guarding, no tenderness at McBurney's point and negative Murphy's sign. No hernia.  Musculoskeletal: Normal range of motion.  Neurological: He is alert and oriented to person, place, and time. He has normal strength. No cranial nerve deficit or sensory deficit. He exhibits abnormal muscle tone. Coordination abnormal. GCS eye subscore is 4. GCS verbal subscore is 5. GCS motor subscore is 6.  Extraocular muscle movement: normal No visual field cut Pupils: equal and reactive both direct and consensual response is normal No nystagmus present    Sensory function is intact to light touch, pinprick Proprioception intact  Grip strength 5/5 symmetric in upper extremities + Pronator drift on the left  Left Lower extremity strength 4+/5 against resistance Right Lower extremity strength 5/5 against resistance  Normal finger to nose bilaterally Slight difficulty with heel to shin on left, normal on right   Skin: Skin is warm, dry and intact. No rash noted. No cyanosis.  Psychiatric: He has a normal mood and affect. His speech is normal and behavior is normal. Thought content normal.    ED Course  Procedures (including critical care time) Labs Review Labs Reviewed  PROTIME-INR - Abnormal; Notable for the following:    Prothrombin Time 26.1 (*)    INR 2.39 (*)    All other components within normal limits  APTT - Abnormal; Notable for the following:    aPTT 44 (*)    All other components within normal limits  CBC - Abnormal; Notable for the following:    Hemoglobin 17.3 (*)    All other components within normal limits  COMPREHENSIVE METABOLIC PANEL - Abnormal; Notable for the following:    Glucose, Bld 111 (*)    GFR calc non Af Amer 81 (*)    All other components within normal limits  I-STAT CHEM 8, ED - Abnormal; Notable for the following:     BUN 28 (*)    Glucose, Bld 115 (*)    Hemoglobin 18.4 (*)    HCT 54.0 (*)    All other components within normal limits  ETHANOL  DIFFERENTIAL  URINE RAPID DRUG SCREEN (HOSP PERFORMED)  URINALYSIS, ROUTINE W REFLEX MICROSCOPIC  I-STAT TROPOININ, ED  I-STAT TROPOININ, ED    Imaging Review Ct Head Wo Contrast  12/28/2013   CLINICAL DATA:  Transient left-sided weakness upon awakening earlier today ; partial resolution since earlier in the day  EXAM: CT HEAD WITHOUT CONTRAST  TECHNIQUE: Contiguous axial images were obtained from the base of the skull through the vertex without intravenous contrast.  COMPARISON:  None.  FINDINGS: There is age related volume loss. There is no demonstrable mass, hemorrhage, extra-axial fluid collection, or midline shift. There is minimal small vessel disease in the centra semiovale bilaterally. Elsewhere gray-white  compartments appear normal. There is no demonstrable acute infarct. Bony calvarium appears intact. The mastoid air cells are clear. Each middle cerebral artery shows some increased attenuation ; this finding is more notable on the right than on the left. Significance of this finding is uncertain, particularly given the history of resolving weakness on the left.  IMPRESSION: No intracranial mass or hemorrhage. No acute appearing infarct demonstrable. Slight small vessel disease in the periventricular white matter. Age related volume loss is present. There is slightly more notable increased attenuation in the right middle cerebral artery compared to the left. This finding has been associated with early infarct in the right middle cerebral artery distribution. The significance of this finding in light of the clinical symptoms, however, must be viewed as questionable.   Electronically Signed   By: Lowella Grip M.D.   On: 12/28/2013 11:44     EKG Interpretation None      MDM   Final diagnoses:  None   TIA versus CVA  Atrial fibrillation  Patient  presents to the ER for evaluation of left-sided weakness. Patient awakened this morning with significant weakness of his left arm and leg. He noticed the symptoms at 5 AM, but broke up with him and therefore last note normal is unknown. He is not a candidate for TPA based on timing, but also is rapidly improving.  Patient did have very subtle weakness in his exam upon my evaluation. This continued to improve in the ER, he now feels like he is back to his normal baseline.  Patient's CT does not show any obvious acute infarct, but there is increased attenuation in the right middle cerebral artery which can be seen in early infarct. Will obtain neurology consult and admit the patient to medicine.  Patient is in atrial fibrillation. He does report a previous history of atrial fibrillation but previously cardioverted. Recurrent atrial fibrillation is a new finding of unclear chronicity at this time. He did not know he was in A. fib, had no symptoms. He is rate controlled with Cardizem. Patient is already anticoagulated with Coumadin.  Addendum: Discussed briefly with Doctor Leonel Ramsay, neurology. Will consult on the patient. Agrees that no known last normal neurologic time and also rapidly improving symptoms, not a candidate for any interventions including IR.  Orpah Greek, MD 12/28/13 [place], MD 12/28/13 1225

## 2013-12-29 DIAGNOSIS — Z6833 Body mass index (BMI) 33.0-33.9, adult: Secondary | ICD-10-CM | POA: Diagnosis not present

## 2013-12-29 DIAGNOSIS — E780 Pure hypercholesterolemia: Secondary | ICD-10-CM | POA: Diagnosis present

## 2013-12-29 DIAGNOSIS — E669 Obesity, unspecified: Secondary | ICD-10-CM | POA: Diagnosis present

## 2013-12-29 DIAGNOSIS — G8194 Hemiplegia, unspecified affecting left nondominant side: Secondary | ICD-10-CM | POA: Diagnosis present

## 2013-12-29 DIAGNOSIS — Z79899 Other long term (current) drug therapy: Secondary | ICD-10-CM | POA: Diagnosis not present

## 2013-12-29 DIAGNOSIS — N400 Enlarged prostate without lower urinary tract symptoms: Secondary | ICD-10-CM | POA: Diagnosis present

## 2013-12-29 DIAGNOSIS — I517 Cardiomegaly: Secondary | ICD-10-CM

## 2013-12-29 DIAGNOSIS — D45 Polycythemia vera: Secondary | ICD-10-CM | POA: Diagnosis present

## 2013-12-29 DIAGNOSIS — I10 Essential (primary) hypertension: Secondary | ICD-10-CM | POA: Diagnosis present

## 2013-12-29 DIAGNOSIS — I639 Cerebral infarction, unspecified: Secondary | ICD-10-CM | POA: Diagnosis present

## 2013-12-29 DIAGNOSIS — I4891 Unspecified atrial fibrillation: Secondary | ICD-10-CM | POA: Diagnosis present

## 2013-12-29 DIAGNOSIS — G451 Carotid artery syndrome (hemispheric): Secondary | ICD-10-CM

## 2013-12-29 DIAGNOSIS — Z7901 Long term (current) use of anticoagulants: Secondary | ICD-10-CM | POA: Diagnosis not present

## 2013-12-29 DIAGNOSIS — I63419 Cerebral infarction due to embolism of unspecified middle cerebral artery: Secondary | ICD-10-CM | POA: Diagnosis present

## 2013-12-29 DIAGNOSIS — E119 Type 2 diabetes mellitus without complications: Secondary | ICD-10-CM | POA: Diagnosis present

## 2013-12-29 DIAGNOSIS — G4733 Obstructive sleep apnea (adult) (pediatric): Secondary | ICD-10-CM | POA: Diagnosis present

## 2013-12-29 LAB — LIPID PANEL
CHOLESTEROL: 113 mg/dL (ref 0–200)
HDL: 31 mg/dL — ABNORMAL LOW (ref >39)
LDL CALC: 60 mg/dL (ref 0–99)
Total CHOL/HDL Ratio: 3.6 RATIO
Triglycerides: 110 mg/dL (ref <150)
VLDL: 22 mg/dL (ref 0–40)

## 2013-12-29 LAB — GLUCOSE, CAPILLARY
GLUCOSE-CAPILLARY: 113 mg/dL — AB (ref 70–99)
Glucose-Capillary: 96 mg/dL (ref 70–99)

## 2013-12-29 LAB — JAK2 GENOTYPR: JAK2 GENOTYPR: NOT DETECTED

## 2013-12-29 LAB — HIV ANTIBODY (ROUTINE TESTING W REFLEX): HIV: NONREACTIVE

## 2013-12-29 LAB — RPR

## 2013-12-29 LAB — HEMOGLOBIN A1C
HEMOGLOBIN A1C: 6.2 % — AB (ref <5.7)
Mean Plasma Glucose: 131 mg/dL — ABNORMAL HIGH (ref <117)

## 2013-12-29 MED ORDER — WARFARIN - PHARMACIST DOSING INPATIENT
Freq: Every day | Status: DC
Start: 2013-12-29 — End: 2013-12-29

## 2013-12-29 NOTE — Progress Notes (Signed)
ANTICOAGULATION CONSULT NOTE - Initial Consult  Pharmacy Consult for coumadin Indication: atrial fibrillation  No Known Allergies  Patient Measurements: Height: 5\' 11"  (180.3 cm) Weight: 236 lb 14.4 oz (107.457 kg) IBW/kg (Calculated) : 75.3   Vital Signs: Temp: 97.8 F (36.6 C) (10/07 0619) Temp Source: Oral (10/07 0619) BP: 151/96 mmHg (10/07 0619) Pulse Rate: 90 (10/07 0619)  Labs:  Recent Labs  12/28/13 1004 12/28/13 1018  HGB 17.3* 18.4*  HCT 50.0 54.0*  PLT 261  --   APTT 44*  --   LABPROT 26.1*  --   INR 2.39*  --   CREATININE 0.98 1.00    Estimated Creatinine Clearance: 85.8 ml/min (by C-G formula based on Cr of 1).   Medical History: Past Medical History  Diagnosis Date  . Hypercholesteremia   . Glaucoma   . Hypertension   . Tendinitis     History in Rotator cuff  . Epididymal cyst 2005    Bilateral and small bilateral hydroceles with most recent ultra sound  . Arthritis of right ankle   . Atrial fibrillation     Dr Radford Pax  . BPH (benign prostatic hypertrophy)     Mild  . Kidney stones     "I've passed them all"  . Type II diabetes mellitus   . Migraine     "got off caffeine; they went away"  . TIA (transient ischemic attack) ?12/28/2013    Medications:  Prescriptions prior to admission  Medication Sig Dispense Refill  . atorvastatin (LIPITOR) 80 MG tablet Take 80 mg by mouth daily.      . bimatoprost (LUMIGAN) 0.03 % ophthalmic solution Place 1 drop into both eyes at bedtime.       . brimonidine-timolol (COMBIGAN) 0.2-0.5 % ophthalmic solution Place 1 drop into both eyes every 12 (twelve) hours.      Marland Kitchen CINNAMON PO Take 1 capsule by mouth 2 (two) times daily.      Marland Kitchen diltiazem (DILACOR XR) 240 MG 24 hr capsule Take 240 mg by mouth daily.      . metFORMIN (GLUCOPHAGE) 500 MG tablet Take 500 mg by mouth daily with breakfast.      . metoprolol tartrate (LOPRESSOR) 25 MG tablet Take 25 mg by mouth 2 (two) times daily.      . Omega 3 1000 MG  CAPS Take 1,000 mg by mouth 2 (two) times daily.      Marland Kitchen omega-3 acid ethyl esters (LOVAZA) 1 G capsule Take 1 g by mouth 2 (two) times daily.      Marland Kitchen warfarin (COUMADIN) 5 MG tablet Take 2.5-5 mg by mouth daily. 1/2 tablet (2.5 mg) on Mondays, Thursdays, and Saturdays; 1 tablet (5 mg) on all other days.      Marland Kitchen zolpidem (AMBIEN CR) 12.5 MG CR tablet Take 12.5 mg by mouth at bedtime as needed for sleep.       Scheduled:  .  stroke: mapping our early stages of recovery book   Does not apply Once  . atorvastatin  80 mg Oral Daily  . brimonidine  1 drop Both Eyes Q12H   And  . timolol  1 drop Both Eyes Q12H  . diltiazem  240 mg Oral Daily  . insulin aspart  0-9 Units Subcutaneous TID WC  . latanoprost  1 drop Both Eyes QHS  . metoprolol tartrate  25 mg Oral BID  . omega-3 acid ethyl esters  1 g Oral BID  . [START ON 12/30/2013] warfarin  2.5  mg Oral Once per day on Mon Thu Sat   And  . warfarin  5 mg Oral Once per day on Sun Tue Wed Fri  . Warfarin - Physician Dosing Inpatient   Does not apply q1800    Assessment: 68 yo male here for L sided weakness and with likely stroke per neuro. Patient on coumadin PTA for afib and pharmacy has been consulted to dose.  INR was 2.39 on 12/28/13 and is within goal (INR also noted at goal 12/24/13). She is currently receiving his home regimen.  Home coumadin: 5mg /day except 2.5mg  MThSa  Goal of Therapy:  INR 2-3 Monitor platelets by anticoagulation protocol: Yes   Plan:  -Continue coumadin as taken at home -Daily PT/INR beginning in am  Hildred Laser, Pharm D 12/29/2013 9:23 AM

## 2013-12-29 NOTE — Progress Notes (Signed)
STROKE TEAM PROGRESS NOTE   HISTORY Norman [name] is an 68 y.o. male who went to sleep at 11:30 PM last night 12/27/2013 feeling well. He awoke at 0500 hours 12/28/2013 and when he tried to get out of bed he noted his left arm felt "rubbery and was unable to put weight on his left leg". This lasted for about one hour and before he felt it returned to baseline. Patient was brought to ED by wife concerned he suffered a stroke. He feels hs symptoms have resolved at this time.  Of note: Admitting labs show a Hg 18.4 and Hct 54  Patient was not administered TPA secondary to  resolving symptoms and INR 2.3. He was admitted for further evaluation and treatment.   SUBJECTIVE (INTERVAL HISTORY) Overall he feels his condition is completely resolved. Dr Leonie Man and he talked a long time about anticoagulation options. He is very happy with his coumadin and would like to stay on it.   OBJECTIVE Temp:  [97.6 F (36.4 C)-98.8 F (37.1 C)] 97.8 F (36.6 C) (10/07 0619) Pulse Rate:  [66-92] 90 (10/07 0619) Cardiac Rhythm:  [-] Atrial fibrillation (10/07 0730) Resp:  [15-23] 20 (10/07 0619) BP: (116-157)/(60-99) 151/96 mmHg (10/07 0619) SpO2:  [93 %-99 %] 98 % (10/07 0619) Weight:  [107.457 kg (236 lb 14.4 oz)] 107.457 kg (236 lb 14.4 oz) (10/06 2325)   Recent Labs Lab 12/28/13 2042 12/29/13 0728  GLUCAP 123* 113*    Recent Labs Lab 12/28/13 1004 12/28/13 1018  NA 139 138  K 4.8 4.9  CL 103 107  CO2 25  --   GLUCOSE 111* 115*  BUN 23 28*  CREATININE 0.98 1.00  CALCIUM 9.5  --     Recent Labs Lab 12/28/13 1004  AST 21  ALT 22  ALKPHOS 103  BILITOT 0.4  PROT 7.0  ALBUMIN 3.6    Recent Labs Lab 12/28/13 1004 12/28/13 1018  WBC 9.1  --   NEUTROABS 5.6  --   HGB 17.3* 18.4*  HCT 50.0 54.0*  MCV 90.4  --   PLT 261  --    No results found for this basename: CKTOTAL, CKMB, CKMBINDEX, TROPONINI,  in the last 168 hours  Recent Labs  12/28/13 1004  LABPROT 26.1*  INR 2.39*     Recent Labs  12/28/13 1143  COLORURINE YELLOW  LABSPEC 1.012  PHURINE 7.0  GLUCOSEU NEGATIVE  HGBUR NEGATIVE  BILIRUBINUR NEGATIVE  KETONESUR NEGATIVE  PROTEINUR NEGATIVE  UROBILINOGEN 0.2  NITRITE NEGATIVE  LEUKOCYTESUR NEGATIVE       Component Value Date/Time   CHOL 113 12/29/2013 0400   TRIG 110 12/29/2013 0400   HDL 31* 12/29/2013 0400   CHOLHDL 3.6 12/29/2013 0400   VLDL 22 12/29/2013 0400   LDLCALC 60 12/29/2013 0400   Lab Results  Component Value Date   HGBA1C 6.2* 12/28/2013      Component Value Date/Time   LABOPIA NONE DETECTED 12/28/2013 1143   COCAINSCRNUR NONE DETECTED 12/28/2013 1143   LABBENZ NONE DETECTED 12/28/2013 1143   AMPHETMU NONE DETECTED 12/28/2013 1143   THCU NONE DETECTED 12/28/2013 1143   LABBARB NONE DETECTED 12/28/2013 1143     Recent Labs Lab 12/28/13 1004  ETH <11    Ct Head Wo Contrast  12/28/2013   CLINICAL DATA:  Transient left-sided weakness upon awakening earlier today ; partial resolution since earlier in the day  EXAM: CT HEAD WITHOUT CONTRAST  TECHNIQUE: Contiguous axial images were obtained from the base  of the skull through the vertex without intravenous contrast.  COMPARISON:  None.  FINDINGS: There is age related volume loss. There is no demonstrable mass, hemorrhage, extra-axial fluid collection, or midline shift. There is minimal small vessel disease in the centra semiovale bilaterally. Elsewhere gray-white compartments appear normal. There is no demonstrable acute infarct. Bony calvarium appears intact. The mastoid air cells are clear. Each middle cerebral artery shows some increased attenuation ; this finding is more notable on the right than on the left. Significance of this finding is uncertain, particularly given the history of resolving weakness on the left.  IMPRESSION: No intracranial mass or hemorrhage. No acute appearing infarct demonstrable. Slight small vessel disease in the periventricular white matter. Age related volume  loss is present. There is slightly more notable increased attenuation in the right middle cerebral artery compared to the left. This finding has been associated with early infarct in the right middle cerebral artery distribution. The significance of this finding in light of the clinical symptoms, however, must be viewed as questionable.   Electronically Signed   By: Lowella Grip M.D.   On: 12/28/2013 11:44   Mri Brain Without Contrast  12/28/2013   CLINICAL DATA:  Left-sided weakness beginning earlier today with slight LEFT upper extremity weakness. Patient currently anticoagulated. Stroke risk factors include atrial fibrillation, diabetes, hyperlipidemia, and hypertension.  EXAM: MRI HEAD WITHOUT CONTRAST  MRA HEAD WITHOUT CONTRAST  TECHNIQUE: Multiplanar, multiecho pulse sequences of the brain and surrounding structures were obtained without intravenous contrast. Angiographic images of the head were obtained using MRA technique without contrast.  COMPARISON:  CT head earlier today  FINDINGS: MRI HEAD FINDINGS  Acute RIGHT lenticulostriate territory infarct affecting the posterior putamen, posterior limb internal capsule, and periventricular white matter. No other areas of acute infarction.  No hemorrhage, mass lesion, hydrocephalus, or extra-axial fluid.  Generalized atrophy. Mild subcortical and periventricular T2 and FLAIR hyperintensities, likely chronic microvascular ischemic change. No foci of acute or chronic hemorrhage. Flow voids are maintained. No midline abnormality. Extracranial soft tissues unremarkable. No osseous lesions.  MRA HEAD FINDINGS  The internal carotid arteries are dolichoectatic but widely patent. The basilar artery is widely patent with the LEFT vertebral dominant/sole contributor. RIGHT vertebral supplies PICA. There is no intracranial stenosis or aneurysm.  IMPRESSION: Acute RIGHT MCA territory infarct as described. No associated hemorrhage.  Mild chronic microvascular ischemic  change.  No proximal flow reducing lesion seen on intracranial MRA.   Electronically Signed   By: Rolla Flatten M.D.   On: 12/28/2013 20:15   Norman [name] Head/brain Wo Cm  12/28/2013   CLINICAL DATA:  Left-sided weakness beginning earlier today with slight LEFT upper extremity weakness. Patient currently anticoagulated. Stroke risk factors include atrial fibrillation, diabetes, hyperlipidemia, and hypertension.  EXAM: MRI HEAD WITHOUT CONTRAST  MRA HEAD WITHOUT CONTRAST  TECHNIQUE: Multiplanar, multiecho pulse sequences of the brain and surrounding structures were obtained without intravenous contrast. Angiographic images of the head were obtained using MRA technique without contrast.  COMPARISON:  CT head earlier today  FINDINGS: MRI HEAD FINDINGS  Acute RIGHT lenticulostriate territory infarct affecting the posterior putamen, posterior limb internal capsule, and periventricular white matter. No other areas of acute infarction.  No hemorrhage, mass lesion, hydrocephalus, or extra-axial fluid.  Generalized atrophy. Mild subcortical and periventricular T2 and FLAIR hyperintensities, likely chronic microvascular ischemic change. No foci of acute or chronic hemorrhage. Flow voids are maintained. No midline abnormality. Extracranial soft tissues unremarkable. No osseous lesions.  MRA HEAD FINDINGS  The internal carotid arteries are dolichoectatic but widely patent. The basilar artery is widely patent with the LEFT vertebral dominant/sole contributor. RIGHT vertebral supplies PICA. There is no intracranial stenosis or aneurysm.  IMPRESSION: Acute RIGHT MCA territory infarct as described. No associated hemorrhage.  Mild chronic microvascular ischemic change.  No proximal flow reducing lesion seen on intracranial MRA.   Electronically Signed   By: Rolla Flatten M.D.   On: 12/28/2013 20:15     PHYSICAL EXAM Pleasant elderly male not in distress.Awake alert. Afebrile. Head is nontraumatic. Neck is supple without bruit.  Hearing is normal. Cardiac exam no murmur or gallop. Lungs are clear to auscultation. Distal pulses are well felt. Neurological Exam : Awake alert oriented x 3 normal speech and language. Mild left lower face asymmetry. Tongue midline. No drift. Mild diminished fine finger movements on left. Orbits right over left upper extremity. Mild left grip weak.. Normal sensation . Normal coordination. ASSESSMENT/PLAN Norman [name] is a 68 y.o. male with presenting with left sided weakness.  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     MRI  R MCA infarct  MRA  No significant stenosis  Carotid Doppler  No significant stenosis  2D Echo  No source of embolus  Resultant neuro sx resolved   warfarin prior to admission, now on warfarin  Patient counseled to be compliant with his antithrombotic medications  Risk factor education  Ongoing aggressive risk factor management  Therapy recommendations:  No therapy needs  Disposition: home  Atrial Fibrillation  On wafarin PTA  INR 2.39 on arrival  Patient happy with his wafarin  As current stroke not related to his atrial fibrillation, no reason to change to another anticoagulant (NOAC)  Hypertension  Stable  Hyperlipidemia  Home meds:  .lipitor 80  LDL 60  Continue statin at discharge  Diabetes  HgbA1c 6.2, Goal < 7.0  Controlled  Other Stroke Risk Factors Advanced age   Obesity, Body mass index is 33.06 kg/(m^2).    Family hx TIAs (father) Possible Obstructive sleep apnea, needs OP evaluation  Hospital day # 1  [facility] BIBY, MSN, RN, ANVP-BC, ANP-BC, [facility] Pager: [phone] 12/29/2013 6:46 PM   I have personally examined this patient, reviewed notes, independently viewed imaging studies, participated in medical decision making and plan of care. I have made any additions or clarifications directly to the above note. Agree with note above.   Antony Contras,  MD Medical Director [facility] Pager: [phone] 12/29/2013 11:01 PM   To contact Stroke Continuity provider, please refer to [email or web address] After hours, contact General Neurology

## 2013-12-29 NOTE — Progress Notes (Signed)
  Echocardiogram 2D Echocardiogram has been performed.  [name] 12/29/2013, 11:44 AM

## 2013-12-29 NOTE — Discharge Summary (Signed)
Physician Discharge Summary  Norman [name] GHW:[id number] DOB: [birth year] DOA: 12/28/2013  PCP: Gara Kroner, MD  Admit date: 12/28/2013 Discharge date: 12/29/2013  Time spent: 45 minutes  Recommendations for Outpatient Follow-up:  1. Dr.Sethi in 1 month 2. PCP in 1 week, has mild polycythemia, please compare with baseline CBC and may need Heme workup for this  Discharge Diagnoses:    CVA Active Problems:   Atrial fibrillation   Essential hypertension, benign   Mild polycythemia  Discharge Condition: stabke  Diet recommendation: heart healthy  Filed Weights   12/28/13 2325  Weight: 107.457 kg (236 lb 14.4 oz)    History of present illness:  Norman [name] is a 68 y.o. male with PMH of HTN, DM, A fib on AC presented with L sided weakness; Patient reports that he awakened at 5 AM with L sided weakness, he could not walk because of the weakness. His symptoms have resolved within 1 hour. He denies paraesthesia, no change in speech, or hearing, no vertigo, denies having similar symptoms in the past; denies chest pain, no SOB, no cough, no fever, no nausea, vomiting   Hospital Course:  1. R MCA CVA -L sided transient hemiplegia-resolved; almost back to baseline  -MRI with R MCA infarct -ECHO completed and normal -Carotid duplex with 1-39% stenosis  -takes Coumadin chronically, seen by Neurology recommending to continue Couamdin -Pt/OT eval compelted  2. HTN; stable   3. DM, HbA1c 6.2, resume metformin   4. HPL, on statin, LDL 60  5. Elevated Hg/Hct of unclear etiology; no h/o tobacco use; mild  -chronic, FU EPO, Jack 2; may need outpatient BM biopsy   6. A fib; HR controlled; cont Cardizem, BB, coumadin   Procedures: Carotid Duplex (Doppler): Findings suggest 1-39% internal carotid artery stenosis bilaterally. Vertebral arteries are patent with antegrade flow  ECHO: Study Conclusions - Left ventricle: The cavity size was normal. There was moderate concentric  hypertrophy. Systolic function was normal. The estimated ejection fraction was in the range of 55% to 60%. Wall motion was normal; there were no regional wall motion abnormalities. - Aorta: Aortic root dimension: 41 mm (ED). - Ascending aorta: The ascending aorta was mildly dilated. - Mitral valve: There was trivial regurgitation. - Left atrium: The atrium was mildly dilated  Consultations:  Neuro  Discharge Exam: Filed Vitals:   12/29/13 0619  BP: 151/96  Pulse: 90  Temp: 97.8 F (36.6 C)  Resp: 20    General: AAOx3 Cardiovascular: S1S2/RRR Respiratory: CTAB  Discharge Instructions You were cared for by a hospitalist during your hospital stay. If you have any questions about your discharge medications or the care you received while you were in the hospital after you are discharged, you can call the unit and asked to speak with the hospitalist on call if the hospitalist that took care of you is not available. Once you are discharged, your primary care physician will handle any further medical issues. Please note that NO REFILLS for any discharge medications will be authorized once you are discharged, as it is imperative that you return to your primary care physician (or establish a relationship with a primary care physician if you do not have one) for your aftercare needs so that they can reassess your need for medications and monitor your lab values.   Current Discharge Medication List    CONTINUE these medications which have NOT CHANGED   Details  atorvastatin (LIPITOR) 80 MG tablet Take 80 mg by mouth daily.  bimatoprost (LUMIGAN) 0.03 % ophthalmic solution Place 1 drop into both eyes at bedtime.     brimonidine-timolol (COMBIGAN) 0.2-0.5 % ophthalmic solution Place 1 drop into both eyes every 12 (twelve) hours.    CINNAMON PO Take 1 capsule by mouth 2 (two) times daily.    diltiazem (DILACOR XR) 240 MG 24 hr capsule Take 240 mg by mouth daily.    metFORMIN  (GLUCOPHAGE) 500 MG tablet Take 500 mg by mouth daily with breakfast.    metoprolol tartrate (LOPRESSOR) 25 MG tablet Take 25 mg by mouth 2 (two) times daily.    Omega 3 1000 MG CAPS Take 1,000 mg by mouth 2 (two) times daily.    omega-3 acid ethyl esters (LOVAZA) 1 G capsule Take 1 g by mouth 2 (two) times daily.    warfarin (COUMADIN) 5 MG tablet Take 2.5-5 mg by mouth daily. 1/2 tablet (2.5 mg) on Mondays, Thursdays, and Saturdays; 1 tablet (5 mg) on all other days.    zolpidem (AMBIEN CR) 12.5 MG CR tablet Take 12.5 mg by mouth at bedtime as needed for sleep.       No Known Allergies    The results of significant diagnostics from this hospitalization (including imaging, microbiology, ancillary and laboratory) are listed below for reference.    Significant Diagnostic Studies: Ct Head Wo Contrast  12/28/2013   CLINICAL DATA:  Transient left-sided weakness upon awakening earlier today ; partial resolution since earlier in the day  EXAM: CT HEAD WITHOUT CONTRAST  TECHNIQUE: Contiguous axial images were obtained from the base of the skull through the vertex without intravenous contrast.  COMPARISON:  None.  FINDINGS: There is age related volume loss. There is no demonstrable mass, hemorrhage, extra-axial fluid collection, or midline shift. There is minimal small vessel disease in the centra semiovale bilaterally. Elsewhere gray-white compartments appear normal. There is no demonstrable acute infarct. Bony calvarium appears intact. The mastoid air cells are clear. Each middle cerebral artery shows some increased attenuation ; this finding is more notable on the right than on the left. Significance of this finding is uncertain, particularly given the history of resolving weakness on the left.  IMPRESSION: No intracranial mass or hemorrhage. No acute appearing infarct demonstrable. Slight small vessel disease in the periventricular white matter. Age related volume loss is present. There is  slightly more notable increased attenuation in the right middle cerebral artery compared to the left. This finding has been associated with early infarct in the right middle cerebral artery distribution. The significance of this finding in light of the clinical symptoms, however, must be viewed as questionable.   Electronically Signed   By: Lowella Grip M.D.   On: 12/28/2013 11:44   Mri Brain Without Contrast  12/28/2013   CLINICAL DATA:  Left-sided weakness beginning earlier today with slight LEFT upper extremity weakness. Patient currently anticoagulated. Stroke risk factors include atrial fibrillation, diabetes, hyperlipidemia, and hypertension.  EXAM: MRI HEAD WITHOUT CONTRAST  MRA HEAD WITHOUT CONTRAST  TECHNIQUE: Multiplanar, multiecho pulse sequences of the brain and surrounding structures were obtained without intravenous contrast. Angiographic images of the head were obtained using MRA technique without contrast.  COMPARISON:  CT head earlier today  FINDINGS: MRI HEAD FINDINGS  Acute RIGHT lenticulostriate territory infarct affecting the posterior putamen, posterior limb internal capsule, and periventricular white matter. No other areas of acute infarction.  No hemorrhage, mass lesion, hydrocephalus, or extra-axial fluid.  Generalized atrophy. Mild subcortical and periventricular T2 and FLAIR hyperintensities, likely chronic microvascular ischemic  change. No foci of acute or chronic hemorrhage. Flow voids are maintained. No midline abnormality. Extracranial soft tissues unremarkable. No osseous lesions.  MRA HEAD FINDINGS  The internal carotid arteries are dolichoectatic but widely patent. The basilar artery is widely patent with the LEFT vertebral dominant/sole contributor. RIGHT vertebral supplies PICA. There is no intracranial stenosis or aneurysm.  IMPRESSION: Acute RIGHT MCA territory infarct as described. No associated hemorrhage.  Mild chronic microvascular ischemic change.  No proximal flow  reducing lesion seen on intracranial MRA.   Electronically Signed   By: Rolla Flatten M.D.   On: 12/28/2013 20:15   Mr Jodene Nam Head/brain Wo Cm  12/28/2013   CLINICAL DATA:  Left-sided weakness beginning earlier today with slight LEFT upper extremity weakness. Patient currently anticoagulated. Stroke risk factors include atrial fibrillation, diabetes, hyperlipidemia, and hypertension.  EXAM: MRI HEAD WITHOUT CONTRAST  MRA HEAD WITHOUT CONTRAST  TECHNIQUE: Multiplanar, multiecho pulse sequences of the brain and surrounding structures were obtained without intravenous contrast. Angiographic images of the head were obtained using MRA technique without contrast.  COMPARISON:  CT head earlier today  FINDINGS: MRI HEAD FINDINGS  Acute RIGHT lenticulostriate territory infarct affecting the posterior putamen, posterior limb internal capsule, and periventricular white matter. No other areas of acute infarction.  No hemorrhage, mass lesion, hydrocephalus, or extra-axial fluid.  Generalized atrophy. Mild subcortical and periventricular T2 and FLAIR hyperintensities, likely chronic microvascular ischemic change. No foci of acute or chronic hemorrhage. Flow voids are maintained. No midline abnormality. Extracranial soft tissues unremarkable. No osseous lesions.  MRA HEAD FINDINGS  The internal carotid arteries are dolichoectatic but widely patent. The basilar artery is widely patent with the LEFT vertebral dominant/sole contributor. RIGHT vertebral supplies PICA. There is no intracranial stenosis or aneurysm.  IMPRESSION: Acute RIGHT MCA territory infarct as described. No associated hemorrhage.  Mild chronic microvascular ischemic change.  No proximal flow reducing lesion seen on intracranial MRA.   Electronically Signed   By: Rolla Flatten M.D.   On: 12/28/2013 20:15    Microbiology: No results found for this or any previous visit (from the past 240 hour(s)).   Labs: Basic Metabolic Panel:  Recent Labs Lab 12/28/13 1004  12/28/13 1018  NA 139 138  K 4.8 4.9  CL 103 107  CO2 25  --   GLUCOSE 111* 115*  BUN 23 28*  CREATININE 0.98 1.00  CALCIUM 9.5  --    Liver Function Tests:  Recent Labs Lab 12/28/13 1004  AST 21  ALT 22  ALKPHOS 103  BILITOT 0.4  PROT 7.0  ALBUMIN 3.6   No results found for this basename: LIPASE, AMYLASE,  in the last 168 hours No results found for this basename: AMMONIA,  in the last 168 hours CBC:  Recent Labs Lab 12/28/13 1004 12/28/13 1018  WBC 9.1  --   NEUTROABS 5.6  --   HGB 17.3* 18.4*  HCT 50.0 54.0*  MCV 90.4  --   PLT 261  --    Cardiac Enzymes: No results found for this basename: CKTOTAL, CKMB, CKMBINDEX, TROPONINI,  in the last 168 hours BNP: BNP (last 3 results) No results found for this basename: PROBNP,  in the last 8760 hours CBG:  Recent Labs Lab 12/28/13 2042 12/29/13 0728 12/29/13 1217  GLUCAP 123* 113* 96       Signed:  Masayoshi Couzens  [facility] 12/29/2013, 2:20 PM

## 2013-12-29 NOTE — Progress Notes (Signed)
Pt discharged home with family.  Reviewed discharge instructions and education, all questions answered.  Assessment unchanged from earlier.  

## 2013-12-29 NOTE — Progress Notes (Signed)
*  PRELIMINARY RESULTS* Vascular Ultrasound Carotid Duplex (Doppler) has been completed.   Findings suggest 1-39% internal carotid artery stenosis bilaterally. Vertebral arteries are patent with antegrade flow.  12/29/2013 11:53 AM Maudry Mayhew, RVT, RDCS, RDMS

## 2013-12-30 LAB — ERYTHROPOIETIN: Erythropoietin: 22.6 m[IU]/mL — ABNORMAL HIGH (ref 2.6–18.5)

## 2014-01-03 ENCOUNTER — Telehealth: Payer: Self-pay | Admitting: Cardiology

## 2014-01-03 NOTE — Telephone Encounter (Signed)
New Message    Pt called stating he went into A-fib 1130pm-5:30am last Monday, pt states he had a stroke    Pt is asking what his next steps should be in regards to follow up with Dr. Radford Pax since recent hospitalization.

## 2014-01-03 NOTE — Telephone Encounter (Signed)
I spoke with the pt and he wanted to make Dr Radford Pax aware that he was hospitalized last week with a stroke. The pt would like to know if there is anything else that needs to be done from a cardiac standpoint.  I will forward this message to Dr Radford Pax and Jodi Mourning to review and follow-up with the pt.

## 2014-01-04 ENCOUNTER — Other Ambulatory Visit: Payer: Self-pay | Admitting: General Surgery

## 2014-01-04 DIAGNOSIS — I480 Paroxysmal atrial fibrillation: Secondary | ICD-10-CM

## 2014-01-04 NOTE — Telephone Encounter (Signed)
Chart reviewed - per neuro he needs to continue on warfarin - would try to keep INR around 2.5 since it was 2 at time of CVA - forward to [facility] in Coumadin clinic.  ALso please order a 30 day heart monitor to assess the amount of PAF he is having

## 2014-01-04 NOTE — Telephone Encounter (Signed)
Awaiting response from Dr Radford Pax to advise pt.

## 2014-01-04 NOTE — Telephone Encounter (Signed)
lm[facility] will also forward to Gay Filler To make aware.

## 2014-01-04 NOTE — Telephone Encounter (Signed)
Noted  

## 2014-01-04 NOTE — Telephone Encounter (Signed)
Will change range to 2.5-3.0

## 2014-01-04 NOTE — Telephone Encounter (Signed)
Pt is aware. Ordered Monitor for pt.

## 2014-01-07 ENCOUNTER — Telehealth: Payer: Self-pay | Admitting: *Deleted

## 2014-01-07 ENCOUNTER — Encounter (INDEPENDENT_AMBULATORY_CARE_PROVIDER_SITE_OTHER): Payer: Medicare Other

## 2014-01-07 ENCOUNTER — Encounter: Payer: Self-pay | Admitting: Radiology

## 2014-01-07 DIAGNOSIS — I4891 Unspecified atrial fibrillation: Secondary | ICD-10-CM

## 2014-01-07 DIAGNOSIS — I480 Paroxysmal atrial fibrillation: Secondary | ICD-10-CM

## 2014-01-07 NOTE — Telephone Encounter (Signed)
Received call from [facility] regarding patient being in AFib and heart rate of 70. Patient has known Afib and is currently on Warfarin. Will forward to Dr Radford Pax

## 2014-01-07 NOTE — Progress Notes (Signed)
Patient ID: Norman [name], male   DOB: 1945-10-03, 68 y.o.   MRN: [id number] [employer] 30 day monitor applied. EOS 02-07-14

## 2014-01-07 NOTE — Telephone Encounter (Signed)
Please have patient come in to see me next week

## 2014-01-07 NOTE — Telephone Encounter (Signed)
Spoke with patient and he will be out of town Monday and Tuesday. Discussed with Dr Radford Pax and ok to wait until following week. Scheduled appointment and advised patient. Patient stated he was feeling fine.

## 2014-01-21 ENCOUNTER — Ambulatory Visit (INDEPENDENT_AMBULATORY_CARE_PROVIDER_SITE_OTHER): Payer: Medicare Other | Admitting: Cardiology

## 2014-01-21 ENCOUNTER — Encounter: Payer: Self-pay | Admitting: Cardiology

## 2014-01-21 VITALS — BP 132/78 | HR 57 | Ht 71.0 in | Wt 241.4 lb

## 2014-01-21 DIAGNOSIS — Z5181 Encounter for therapeutic drug level monitoring: Secondary | ICD-10-CM

## 2014-01-21 DIAGNOSIS — I480 Paroxysmal atrial fibrillation: Secondary | ICD-10-CM

## 2014-01-21 DIAGNOSIS — I638 Other cerebral infarction: Secondary | ICD-10-CM

## 2014-01-21 DIAGNOSIS — I10 Essential (primary) hypertension: Secondary | ICD-10-CM

## 2014-01-21 DIAGNOSIS — I6389 Other cerebral infarction: Secondary | ICD-10-CM

## 2014-01-21 NOTE — Patient Instructions (Signed)
Your physician wants you to follow-up in: 6 months with Dr. Turner. You will receive a reminder letter in the mail two months in advance. If you don't receive a letter, please call our office to schedule the follow-up appointment.  Your physician recommends that you continue on your current medications as directed. Please refer to the Current Medication list given to you today.  

## 2014-01-21 NOTE — Progress Notes (Signed)
[place], [place] [place], [place]  [address] Phone: [phone] Fax:  [phone]  Date:  01/21/2014   ID:  DOMONIC KIMBALL, DOB [birth year], MRN [id number]  PCP:  Gara Kroner, MD  Cardiologist:  Fransico Him, MD    History of Present Illness: Norman [name] is a 68 y.o. male with a history of PAF and HTN who presents today for followup. He is doing well. He denies any chest pain, SOB, DOE, LE edema, dizziness, palpitation or syncope. Since I saw him last he was in the hospital earlier this month with a right MCA CVA.  Carotid dopplers showed 1-39% stenosis.  He was on coumadin for his afib and this was continued.  He now presents back for followup.  2D echo showed normal LVF with mildly dilated aortic root   Wt Readings from Last 3 Encounters:  01/21/14 241 lb 6.4 oz (109.498 kg)  12/28/13 236 lb 14.4 oz (107.457 kg)  04/23/13 246 lb (111.585 kg)     Past Medical History  Diagnosis Date  . Hypercholesteremia   . Glaucoma   . Hypertension   . Tendinitis     History in Rotator cuff  . Epididymal cyst 2005    Bilateral and small bilateral hydroceles with most recent ultra sound  . Arthritis of right ankle   . Atrial fibrillation     Dr Radford Pax  . BPH (benign prostatic hypertrophy)     Mild  . Kidney stones     "I've passed them all"  . Type II diabetes mellitus   . Migraine     "got off caffeine; they went away"  . TIA (transient ischemic attack) ?12/28/2013    Current Outpatient Prescriptions  Medication Sig Dispense Refill  . atorvastatin (LIPITOR) 80 MG tablet Take 80 mg by mouth daily.      . bimatoprost (LUMIGAN) 0.03 % ophthalmic solution Place 1 drop into both eyes at bedtime.       . brimonidine-timolol (COMBIGAN) 0.2-0.5 % ophthalmic solution Place 1 drop into both eyes every 12 (twelve) hours.      Marland Kitchen CINNAMON PO Take 1 capsule by mouth 2 (two) times daily.      Marland Kitchen diltiazem (DILACOR XR) 240 MG 24 hr capsule Take 240 mg by mouth daily.      . metFORMIN  (GLUCOPHAGE) 500 MG tablet Take 500 mg by mouth daily with breakfast.      . metoprolol tartrate (LOPRESSOR) 25 MG tablet Take 25 mg by mouth 2 (two) times daily.      . Omega 3 1000 MG CAPS Take 1,000 mg by mouth 2 (two) times daily.      Marland Kitchen warfarin (COUMADIN) 5 MG tablet Take 2.5-5 mg by mouth daily. 1/2 tablet (2.5 mg) on Mondays, Thursdays, and Saturdays; 1 tablet (5 mg) on all other days.      Marland Kitchen zolpidem (AMBIEN CR) 12.5 MG CR tablet Take 12.5 mg by mouth at bedtime as needed for sleep.       No current facility-administered medications for this visit.    Allergies:   No Known Allergies  Social History:  The patient  reports that he has never smoked. He has never used smokeless tobacco. He reports that he drinks about .6 ounces of alcohol per week. He reports that he does not use illicit drugs.   Family History:  The patient's family history is not on file.   ROS:  Please see the history of present illness.  All other systems reviewed and negative.   PHYSICAL EXAM: VS:  BP 132/78  Pulse 57  Ht 5\' 11"  (1.803 m)  Wt 241 lb 6.4 oz (109.498 kg)  BMI 33.68 kg/m2 Well nourished, well developed, in no acute distress HEENT: normal Neck: no JVD Cardiac:  normal S1, S2; RRR; no murmur Lungs:  clear to auscultation bilaterally, no wheezing, rhonchi or rales Abd: soft, nontender, no hepatomegaly Ext: no edema Skin: warm and dry Neuro:  CNs 2-12 intact, no focal abnormalities noted  EKG:  Atrial fibrillation with CVR at 73bpm with no ST changes  ASSESSMENT AND PLAN:  1. PAF now in chronic atrial fibrillation since at least his hospitalization with CVA.  He is completely asymptomatic with the afib.  We discussed rhythm vs. Rate control and he would like to pursue rate control since he is asymptomatic. - continue Diltiazem/metoprolol/warfarin  2. HTN well controlled - continue Diltiazem/metoprolol       3.  Recent CVA      4.  Chronic anticoagulation - try to keep INR at least 2.5  due to CVA  Followup with me in 6 months    Signed, Fransico Him, MD [facility] 01/21/2014 3:09 PM

## 2014-02-04 ENCOUNTER — Telehealth: Payer: Self-pay | Admitting: Cardiology

## 2014-02-04 ENCOUNTER — Ambulatory Visit (INDEPENDENT_AMBULATORY_CARE_PROVIDER_SITE_OTHER): Payer: Medicare Other | Admitting: *Deleted

## 2014-02-04 DIAGNOSIS — I4891 Unspecified atrial fibrillation: Secondary | ICD-10-CM

## 2014-02-04 DIAGNOSIS — Z5181 Encounter for therapeutic drug level monitoring: Secondary | ICD-10-CM

## 2014-02-04 DIAGNOSIS — I480 Paroxysmal atrial fibrillation: Secondary | ICD-10-CM

## 2014-02-04 LAB — POCT INR: INR: 2.5

## 2014-02-04 NOTE — Telephone Encounter (Signed)
Please let patient know that heart monitor showed atrial fibrillation with average HR 75bpm.  The fatest HR was 152bpm.

## 2014-02-09 NOTE — Telephone Encounter (Signed)
Left message to call back  

## 2014-02-10 ENCOUNTER — Telehealth: Payer: Self-pay | Admitting: Cardiology

## 2014-02-10 NOTE — Telephone Encounter (Signed)
Patient informed of results and verbal understanding expressed.   Addressed in other phone note.

## 2014-02-10 NOTE — Telephone Encounter (Signed)
Patient informed of results and verbal understanding expressed.   

## 2014-02-10 NOTE — Telephone Encounter (Signed)
New msg   Patient states he is returning call

## 2014-02-11 ENCOUNTER — Ambulatory Visit: Payer: Self-pay | Admitting: Neurology

## 2014-02-25 ENCOUNTER — Ambulatory Visit (INDEPENDENT_AMBULATORY_CARE_PROVIDER_SITE_OTHER): Payer: Medicare Other | Admitting: *Deleted

## 2014-02-25 DIAGNOSIS — I4891 Unspecified atrial fibrillation: Secondary | ICD-10-CM

## 2014-02-25 DIAGNOSIS — Z5181 Encounter for therapeutic drug level monitoring: Secondary | ICD-10-CM

## 2014-02-25 DIAGNOSIS — I480 Paroxysmal atrial fibrillation: Secondary | ICD-10-CM

## 2014-02-25 LAB — POCT INR: INR: 3.1

## 2014-03-11 ENCOUNTER — Ambulatory Visit (INDEPENDENT_AMBULATORY_CARE_PROVIDER_SITE_OTHER): Payer: Medicare Other | Admitting: Neurology

## 2014-03-11 ENCOUNTER — Encounter: Payer: Self-pay | Admitting: Neurology

## 2014-03-11 VITALS — BP 125/85 | HR 64 | Ht 72.25 in | Wt 245.0 lb

## 2014-03-11 DIAGNOSIS — I4819 Other persistent atrial fibrillation: Secondary | ICD-10-CM

## 2014-03-11 DIAGNOSIS — D751 Secondary polycythemia: Secondary | ICD-10-CM

## 2014-03-11 DIAGNOSIS — Z7901 Long term (current) use of anticoagulants: Secondary | ICD-10-CM

## 2014-03-11 DIAGNOSIS — I639 Cerebral infarction, unspecified: Secondary | ICD-10-CM | POA: Insufficient documentation

## 2014-03-11 DIAGNOSIS — I481 Persistent atrial fibrillation: Secondary | ICD-10-CM

## 2014-03-11 NOTE — Patient Instructions (Addendum)
-   continue coumadin for stroke prevention INR 2.5-3.0 - continue lipitor for storke prevention - aggressive risk factor control. Follow up with your primary care physician for stroke risk factor modification. Recommend maintain blood pressure goal <130/80, diabetes with hemoglobin A1c goal below 6.5% and lipids with LDL cholesterol goal below 70 mg/dL.  - check BP and glucose at home - will draw blood today to recheck hemoglobin. If constant high, we may consider hematology consult. - drink more fluid, keep hydrate. - follow up in 3 months.

## 2014-03-11 NOTE — Progress Notes (Signed)
STROKE NEUROLOGY FOLLOW UP NOTE  NAME: Norman [name] DOB: [birth year]  REASON FOR VISIT: stroke follow up HISTORY FROM: pt and chart  Today we had the pleasure of seeing Norman [name] in follow-up at our Neurology Clinic. Pt was accompanied by wife.   History Summary Norman [name] is an 68 y.o. male with PMH of Afib on coumadin, HTN, HLD and DM was admitted on 12/28/13 for left arm and leg weakness. This lasted for about one hour and then returned to baseline. INR 2.39. MRI showed right BG stroke. Stroke work up unrevealing except a Hg 18.4 and Hct 54. Checked JAK2 gene mutation negative. He was continued on coumadin and statin and discharged home.  Interval History During the interval time, the patient has been doing well. No recurrent symptoms. His last INR 3.0 and BP 125/85 today. He is following with his cardiologist Dr. Heron Nay and she recommend INR goal 2.5-3.0. He admitted that he is inconsistent with his hydration which may be the reason that his Hb is high. He is never smoker.    REVIEW OF SYSTEMS: Full 14 system review of systems performed and notable only for those listed below and in HPI above, all others are negative:  Constitutional: N/A  Cardiovascular: N/A  Ear/Nose/Throat: N/A  Skin: N/A  Eyes: N/A  Respiratory: N/A  Gastroitestinal: N/A  Genitourinary: N/A Hematology/Lymphatic: N/A  Endocrine: N/A  Musculoskeletal: N/A  Allergy/Immunology: N/A  Neurological: N/A  Psychiatric: N/A  The following represents the patient's updated allergies and side effects list: No Known Allergies  The neurologically relevant items on the patient's problem list were reviewed on today's visit.  Neurologic Examination  A problem focused neurological exam (12 or more points of the single system neurologic examination, vital signs counts as 1 point, cranial nerves count for 8 points) was performed.  Blood pressure 125/85, pulse 64, height 6' 0.25" (1.835 m), weight 245 lb (111.131  kg).  General - Well nourished, well developed, in no apparent distress.  Ophthalmologic - Sharp disc margins OU.  Cardiovascular - irregularly irregular heart rate and rhythm.  Mental Status -  Level of arousal and orientation to time, place, and person were intact. Language including expression, naming, repetition, comprehension was assessed and found intact. Fund of Knowledge was assessed and was intact.  Cranial Nerves II - XII - II - Visual field intact OU. III, IV, VI - Extraocular movements intact. V - Facial sensation intact bilaterally. VII - Facial movement intact bilaterally. VIII - Hearing & vestibular intact bilaterally. X - Palate elevates symmetrically. XI - Chin turning & shoulder shrug intact bilaterally. XII - Tongue protrusion intact.  Motor Strength - The patient's strength was normal in all extremities and pronator drift was absent.  Bulk was normal and fasciculations were absent.   Motor Tone - Muscle tone was assessed at the neck and appendages and was normal.  Reflexes - The patient's reflexes were normal in all extremities and he had no pathological reflexes.  Sensory - Light touch, temperature/pinprick, and Romberg testing were assessed and were normal.    Coordination - The patient had normal movements in the hands and feet with no ataxia or dysmetria.  Tremor was absent.  Gait and Station - The patient's transfers, posture, gait, station, and turns were observed as normal.  Data reviewed: I personally reviewed the images and agree with the radiology interpretations.  MRI and MRA - Acute RIGHT lenticulostriate territory infarct affecting the posterior putamen, posterior limb internal capsule,  and periventricular white matter. No associated hemorrhage. Mild chronic microvascular ischemic change. No proximal flow reducing lesion seen on intracranial MRA.  CUS - Bilateral: 1-39% ICA stenosis. Vertebral artery flow is antegrade.  - Left ventricle: The  cavity size was normal. There was moderate concentric hypertrophy. Systolic function was normal. The estimated ejection fraction was in the range of 55% to 60%. Wall motion was normal; there were no regional wall motion abnormalities. - Aorta: Aortic root dimension: 41 mm (ED). - Ascending aorta: The ascending aorta was mildly dilated. - Mitral valve: There was trivial regurgitation. - Left atrium: The atrium was mildly dilated.  Component     Latest Ref Rng 12/28/2013 12/29/2013  Cholesterol     0 - 200 mg/dL  113  Triglycerides     <150 mg/dL  110  HDL     >39 mg/dL  31 (L)  Total CHOL/HDL Ratio       3.6  VLDL     0 - 40 mg/dL  22  LDL (calc)     0 - 99 mg/dL  60  Hgb A1c MFr Bld     <5.7 % 6.2 (H)   Mean Plasma Glucose     <117 mg/dL 131 (H)   Erythropoietin     2.6 - 18.5 mIU/mL 22.6 (H)   JAK2 GenotypR      Not Detected   RPR     NON REAC NON REAC   HIV     NONREACTIVE NONREACTIVE     Assessment: As you may recall, he is a 68 y.o. Caucasian male with PMH of Afib on coumadin, HTN, HLD and DM was admitted on 12/28/13 for right BG stroke. INR therapeutic. Stroke work up unrevealing except a Hg 18.4 and Hct 54. Checked JAK2 gene mutation negative. Pt was continued on coumadin and lipitor. Current INR goal 2.5-3.0 and last INR 3.1. Will repeat CBC to evaluate polycythemia. Encourage fluid intake. Pt never smoker.  Plan:  - continue coumadin for stroke prevention with INR 2.5-3.0 goal - continue lipitor for stroke prevention - aggressive risk factor modification. Follow up with your primary care physician for stroke risk factor modification. Recommend maintain blood pressure goal <130/80, diabetes with hemoglobin A1c goal below 6.5% and lipids with LDL cholesterol goal below 70 mg/dL.  - check BP and glucose at home - repeat CBC - encourage fluid intake, keep hydrated. - RTC in 3 months.  Orders Placed This Encounter  Procedures  . CBC (no diff)    No orders  of the defined types were placed in this encounter.    Patient Instructions  - continue coumadin for stroke prevention INR 2.5-3.0 - continue lipitor for storke prevention - aggressive risk factor control. Follow up with your primary care physician for stroke risk factor modification. Recommend maintain blood pressure goal <130/80, diabetes with hemoglobin A1c goal below 6.5% and lipids with LDL cholesterol goal below 70 mg/dL.  - check BP and glucose at home - will draw blood today to recheck hemoglobin. If constant high, we may consider hematology consult. - drink more fluid, keep hydrate. - follow up in 3 months.   Rosalin Hawking, MD PhD [facility] [address], [place], [place] [address] [phone]

## 2014-03-12 LAB — CBC
HEMATOCRIT: 49.4 % (ref 37.5–51.0)
Hemoglobin: 17 g/dL (ref 12.6–17.7)
MCH: 31.4 pg (ref 26.6–33.0)
MCHC: 34.4 g/dL (ref 31.5–35.7)
MCV: 91 fL (ref 79–97)
Platelets: 265 10*3/uL (ref 150–379)
RBC: 5.41 x10E6/uL (ref 4.14–5.80)
RDW: 13.5 % (ref 12.3–15.4)
WBC: 7.5 10*3/uL (ref 3.4–10.8)

## 2014-03-14 ENCOUNTER — Encounter: Payer: Self-pay | Admitting: Neurology

## 2014-04-01 ENCOUNTER — Ambulatory Visit (INDEPENDENT_AMBULATORY_CARE_PROVIDER_SITE_OTHER): Payer: Medicare Other | Admitting: *Deleted

## 2014-04-01 DIAGNOSIS — I4891 Unspecified atrial fibrillation: Secondary | ICD-10-CM

## 2014-04-01 DIAGNOSIS — Z5181 Encounter for therapeutic drug level monitoring: Secondary | ICD-10-CM

## 2014-04-01 LAB — POCT INR: INR: 3.5

## 2014-04-01 MED ORDER — WARFARIN SODIUM 5 MG PO TABS: 5.0000 mg | ORAL_TABLET | ORAL | Status: DC

## 2014-04-22 ENCOUNTER — Ambulatory Visit (INDEPENDENT_AMBULATORY_CARE_PROVIDER_SITE_OTHER): Payer: Medicare Other | Admitting: *Deleted

## 2014-04-22 DIAGNOSIS — I4891 Unspecified atrial fibrillation: Secondary | ICD-10-CM

## 2014-04-22 DIAGNOSIS — Z5181 Encounter for therapeutic drug level monitoring: Secondary | ICD-10-CM

## 2014-04-22 LAB — POCT INR: INR: 2.6

## 2014-04-26 NOTE — Telephone Encounter (Signed)
This encounter was created in error - please disregard.

## 2014-04-27 ENCOUNTER — Encounter: Payer: Self-pay | Admitting: Cardiology

## 2014-05-20 ENCOUNTER — Ambulatory Visit (INDEPENDENT_AMBULATORY_CARE_PROVIDER_SITE_OTHER): Payer: Medicare Other

## 2014-05-20 DIAGNOSIS — I4891 Unspecified atrial fibrillation: Secondary | ICD-10-CM

## 2014-05-20 DIAGNOSIS — Z5181 Encounter for therapeutic drug level monitoring: Secondary | ICD-10-CM

## 2014-05-20 LAB — POCT INR: INR: 2.5

## 2014-06-14 ENCOUNTER — Encounter: Payer: Self-pay | Admitting: Neurology

## 2014-06-14 ENCOUNTER — Ambulatory Visit (INDEPENDENT_AMBULATORY_CARE_PROVIDER_SITE_OTHER): Payer: Medicare Other | Admitting: Neurology

## 2014-06-14 VITALS — BP 125/86 | HR 66 | Ht 72.25 in | Wt 247.0 lb

## 2014-06-14 DIAGNOSIS — E785 Hyperlipidemia, unspecified: Secondary | ICD-10-CM | POA: Insufficient documentation

## 2014-06-14 DIAGNOSIS — Z7901 Long term (current) use of anticoagulants: Secondary | ICD-10-CM

## 2014-06-14 DIAGNOSIS — I4819 Other persistent atrial fibrillation: Secondary | ICD-10-CM

## 2014-06-14 DIAGNOSIS — E1159 Type 2 diabetes mellitus with other circulatory complications: Secondary | ICD-10-CM | POA: Insufficient documentation

## 2014-06-14 DIAGNOSIS — I10 Essential (primary) hypertension: Secondary | ICD-10-CM

## 2014-06-14 DIAGNOSIS — I481 Persistent atrial fibrillation: Secondary | ICD-10-CM | POA: Diagnosis not present

## 2014-06-14 DIAGNOSIS — I639 Cerebral infarction, unspecified: Secondary | ICD-10-CM

## 2014-06-14 DIAGNOSIS — D751 Secondary polycythemia: Secondary | ICD-10-CM

## 2014-06-14 NOTE — Progress Notes (Signed)
STROKE NEUROLOGY FOLLOW UP NOTE  NAME: BENETT SWOYER DOB: Dec 28, 1945  REASON FOR VISIT: stroke follow up HISTORY FROM: pt and chart  Today we had the pleasure of seeing Norman [name] in follow-up at our Neurology Clinic. Pt was accompanied by wife.   History Summary CALIPH BOROWIAK is an 69 y.o. male with PMH of Afib on coumadin, HTN, HLD and DM was admitted on 12/28/13 for left arm and leg weakness. This lasted for about one hour and then returned to baseline. INR 2.39. MRI showed right BG stroke. Stroke work up unrevealing except a Hg 18.4 and Hct 54. Checked JAK2 gene mutation negative. He was continued on coumadin and statin and discharged home.  03/11/14 follow up - the patient has been doing well. No recurrent symptoms. His last INR 3.0 and BP 125/85 today. He is following with his cardiologist Dr. Heron Nay and she recommend INR goal 2.5-3.0. He admitted that he is inconsistent with his hydration which may be the reason that his Hb is high. He is never smoker.   Interval History During the interval time, he has been doing well. No recurrent stroke like symptoms. CBC last visit showed Hb 17.0. His EPO check in 12/2013 showed elevation. 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 His INR at goal ranging from 2.5 to 3.5 during the interval time. His BP 125/86 today in clinic. Will see PCP in two weeks. Otherwise, no complains.  REVIEW OF SYSTEMS: Full 14 system review of systems performed and notable only for those listed below and in HPI above, all others are negative:  Constitutional: N/A  Cardiovascular: N/A  Ear/Nose/Throat: N/A  Skin: N/A  Eyes: N/A  Respiratory: N/A  Gastroitestinal: N/A  Genitourinary: N/A Hematology/Lymphatic: N/A  Endocrine: N/A  Musculoskeletal: N/A  Allergy/Immunology: N/A  Neurological: N/A  Psychiatric: N/A  The following represents the patient's updated allergies and side effects list: No Known Allergies  The neurologically relevant items on the  patient's problem list were reviewed on today's visit.  Neurologic Examination  A problem focused neurological exam (12 or more points of the single system neurologic examination, vital signs counts as 1 point, cranial nerves count for 8 points) was performed.  Blood pressure 125/86, pulse 66, height 6' 0.25" (1.835 m), weight 247 lb (112.038 kg).  General - Well nourished, well developed, in no apparent distress.  Ophthalmologic - Sharp disc margins OU.  Cardiovascular - irregularly irregular heart rate and rhythm.  Mental Status -  Level of arousal and orientation to time, place, and person were intact. Language including expression, naming, repetition, comprehension was assessed and found intact. Fund of Knowledge was assessed and was intact.  Cranial Nerves II - XII - II - Visual field intact OU. III, IV, VI - Extraocular movements intact. V - Facial sensation intact bilaterally. VII - Facial movement intact bilaterally. VIII - Hearing & vestibular intact bilaterally. X - Palate elevates symmetrically. XI - Chin turning & shoulder shrug intact bilaterally. XII - Tongue protrusion intact.  Motor Strength - The patient's strength was normal in all extremities and pronator drift was absent.  Bulk was normal and fasciculations were absent.   Motor Tone - Muscle tone was assessed at the neck and appendages and was normal.  Reflexes - The patient's reflexes were normal in all extremities and he had no pathological reflexes.  Sensory - Light touch, temperature/pinprick, and Romberg testing were assessed and were normal.    Coordination - The patient had normal movements in the  hands and feet with no ataxia or dysmetria.  Tremor was absent.  Gait and Station - The patient's transfers, posture, gait, station, and turns were observed as normal.  Data reviewed: I personally reviewed the images and agree with the radiology interpretations.  MRI and MRA - Acute RIGHT  lenticulostriate territory infarct affecting the posterior putamen, posterior limb internal capsule, and periventricular white matter. No associated hemorrhage. Mild chronic microvascular ischemic change. No proximal flow reducing lesion seen on intracranial MRA.  CUS - Bilateral: 1-39% ICA stenosis. Vertebral artery flow is antegrade.  2D echo - Left ventricle: The cavity size was normal. There was moderate concentric hypertrophy. Systolic function was normal. The estimated ejection fraction was in the range of 55% to 60%. Wall motion was normal; there were no regional wall motion abnormalities. - Aorta: Aortic root dimension: 41 mm (ED). - Ascending aorta: The ascending aorta was mildly dilated. - Mitral valve: There was trivial regurgitation. - Left atrium: The atrium was mildly dilated.  Component     Latest Ref Rng 05/24/2009 05/25/2009 12/28/2013 12/29/2013  Cholesterol     0 - 200 mg/dL  117 . . .  113  Triglycerides     <150 mg/dL  88  110  HDL     >39 mg/dL  28 (L)  31 (L)  Total CHOL/HDL Ratio       4.2  3.6  VLDL     0 - 40 mg/dL  18  22  LDL (calc)     0 - 99 mg/dL  71 . . .  60  Hemoglobin A1C     <5.7 % 8.2 (H) . . .  6.2 (H)   Mean Plasma Glucose     <117 mg/dL 189  131 (H)   Erythropoietin     2.6 - 18.5 mIU/mL   22.6 (H)   JAK2 GenotypR        Not Detected   RPR     NON REAC   NON REAC   HIV     NONREACTIVE   NONREACTIVE     Assessment: As you may recall, he is a 69 y.o. Caucasian male with PMH of Afib on coumadin, HTN, HLD and DM was admitted on 12/28/13 for right BG stroke. INR therapeutic at 2.39. Stroke work up unrevealing except a Hg 18.4 and Hct 54. Checked JAK2 gene mutation negative and EPO elevated, indicating secondary polycythemia. However, he is a never smoker. Repeat Hb at 17.0. Pt was continued on coumadin and lipitor, with increased INR goal 2.5-3.0 and last INR 2.6. Recommend hematology referral to evaluate secondary polycythemia.  Encourage fluid intake.  Plan:  - continue coumadin for stroke prevention with INR 2.5-3.0 goal - continue lipitor for stroke prevention - recommend to refer to hematology for evaluation of secondary polycythemia. - aggressive risk factor modification. Follow up with your primary care physician for stroke risk factor modification. Recommend maintain blood pressure goal <130/80, diabetes with hemoglobin A1c goal below 6.5% and lipids with LDL cholesterol goal below 70 mg/dL.  - check BP and glucose at home - continue diet control and metformin for DM - encourage fluid intake, keep hydrated. - RTC in 6 months.  No orders of the defined types were placed in this encounter.    No orders of the defined types were placed in this encounter.    Patient Instructions  - continue coumadin for stroke prevention with INR goal 2.5-3.0 - continue lipitor for stroke prevention - recommend you to  discuss with your PCP regarding hematology referral for secondary polycythemia evaluation and management - Follow up with your primary care physician for stroke risk factor modification. Recommend maintain blood pressure goal <130/80, diabetes with hemoglobin A1c goal below 6.5% and lipids with LDL cholesterol goal below 70 mg/dL.  - follow up with cardiology - follow up in 6 months.    Rosalin Hawking, MD PhD [facility] [address] [place], [place] [address] [phone]

## 2014-06-14 NOTE — Patient Instructions (Signed)
-   continue coumadin for stroke prevention with INR goal 2.5-3.0 - continue lipitor for stroke prevention - recommend you to discuss with your PCP regarding hematology referral for secondary polycythemia evaluation and management - Follow up with your primary care physician for stroke risk factor modification. Recommend maintain blood pressure goal <130/80, diabetes with hemoglobin A1c goal below 6.5% and lipids with LDL cholesterol goal below 70 mg/dL.  - follow up with cardiology - follow up in 6 months.

## 2014-06-17 ENCOUNTER — Ambulatory Visit (INDEPENDENT_AMBULATORY_CARE_PROVIDER_SITE_OTHER): Payer: Medicare Other | Admitting: *Deleted

## 2014-06-17 DIAGNOSIS — I481 Persistent atrial fibrillation: Secondary | ICD-10-CM | POA: Diagnosis not present

## 2014-06-17 DIAGNOSIS — Z5181 Encounter for therapeutic drug level monitoring: Secondary | ICD-10-CM

## 2014-06-17 DIAGNOSIS — I4819 Other persistent atrial fibrillation: Secondary | ICD-10-CM

## 2014-06-17 DIAGNOSIS — I4891 Unspecified atrial fibrillation: Secondary | ICD-10-CM

## 2014-06-17 LAB — POCT INR: INR: 3

## 2014-07-29 ENCOUNTER — Ambulatory Visit (INDEPENDENT_AMBULATORY_CARE_PROVIDER_SITE_OTHER): Payer: Medicare Other

## 2014-07-29 DIAGNOSIS — Z5181 Encounter for therapeutic drug level monitoring: Secondary | ICD-10-CM | POA: Diagnosis not present

## 2014-07-29 DIAGNOSIS — I4891 Unspecified atrial fibrillation: Secondary | ICD-10-CM | POA: Diagnosis not present

## 2014-07-29 LAB — POCT INR: INR: 2.8

## 2014-09-09 ENCOUNTER — Ambulatory Visit (INDEPENDENT_AMBULATORY_CARE_PROVIDER_SITE_OTHER): Payer: Medicare Other | Admitting: *Deleted

## 2014-09-09 DIAGNOSIS — Z5181 Encounter for therapeutic drug level monitoring: Secondary | ICD-10-CM

## 2014-09-09 DIAGNOSIS — I4891 Unspecified atrial fibrillation: Secondary | ICD-10-CM | POA: Diagnosis not present

## 2014-09-09 LAB — POCT INR: INR: 2.3

## 2014-10-12 ENCOUNTER — Encounter: Payer: Self-pay | Admitting: Cardiology

## 2014-10-12 DIAGNOSIS — I4821 Permanent atrial fibrillation: Secondary | ICD-10-CM | POA: Insufficient documentation

## 2014-10-12 NOTE — Progress Notes (Signed)
Cardiology Office Note   Date:  10/13/2014   ID:  DOUGLASS DUNSHEE, DOB 04/23/1945, MRN [id number]  PCP:  Gara Kroner, MD    Chief Complaint  Patient presents with  . Atrial Fibrillation    hypertension      History of Present Illness: Norman [name] is a 69 y.o. male with a history of PAF, carotid artery stenosis, CVA, dilated aortic root and HTN who presents today for followup. He is doing well. He denies any chest pain, SOB, DOE, LE edema, dizziness, palpitation or syncope.   He now presents back for followup.    Past Medical History  Diagnosis Date  . Hypercholesteremia   . Glaucoma   . Hypertension   . Tendinitis     History in Rotator cuff  . Epididymal cyst 2005    Bilateral and small bilateral hydroceles with most recent ultra sound  . Arthritis of right ankle   . BPH (benign prostatic hypertrophy)     Mild  . Kidney stones     "I've passed them all"  . Type II diabetes mellitus   . Migraine     "got off caffeine; they went away"  . TIA (transient ischemic attack) ?12/28/2013  . Chronic atrial fibrillation     Past Surgical History  Procedure Laterality Date  . Tonsillectomy    . Cardioversion  X 1     Current Outpatient Prescriptions  Medication Sig Dispense Refill  . atorvastatin (LIPITOR) 80 MG tablet Take 80 mg by mouth daily.    . bimatoprost (LUMIGAN) 0.03 % ophthalmic solution Place 1 drop into both eyes at bedtime.     Marland Kitchen CINNAMON PO Take 1 capsule by mouth 2 (two) times daily.    Marland Kitchen diltiazem (DILACOR XR) 240 MG 24 hr capsule Take 240 mg by mouth daily.    Marland Kitchen HYDROcodone-acetaminophen (NORCO/VICODIN) 5-325 MG per tablet Take 5-325 tablets by mouth as needed. For pain    . LUMIGAN 0.01 % SOLN Place 1 drop into both eyes at bedtime.    . metFORMIN (GLUCOPHAGE) 500 MG tablet Take 500 mg by mouth daily with breakfast.    . metoprolol tartrate (LOPRESSOR) 25 MG tablet Take 25 mg by mouth 2 (two) times daily.    . Omega 3 1000 MG  CAPS Take 1,000 mg by mouth 2 (two) times daily.    Marland Kitchen warfarin (COUMADIN) 5 MG tablet Take 1 tablet (5 mg total) by mouth as directed. Take As Directed by Coumadin Clinic 90 tablet 1  . zolpidem (AMBIEN CR) 12.5 MG CR tablet Take 12.5 mg by mouth at bedtime as needed for sleep.     No current facility-administered medications for this visit.    Allergies:   Review of patient's allergies indicates no known allergies.    Social History:  The patient  reports that he has never smoked. He has never used smokeless tobacco. He reports that he drinks about 0.6 oz of alcohol per week. He reports that he does not use illicit drugs.   Family History:  The patient's family history includes Diabetes in his maternal aunt; Heart attack in his father; Kidney failure in his mother.    ROS:  Please see the history of present illness.   Otherwise, review of systems are positive for none.   All other systems are reviewed and negative.    PHYSICAL EXAM: VS:  BP 132/80  mmHg  Pulse 80  Ht 5' 11.5" (1.816 m)  Wt 228 lb 12.8 oz (103.783 kg)  BMI 31.47 kg/m2  SpO2 98% , BMI Body mass index is 31.47 kg/(m^2). GEN: Well nourished, well developed, in no acute distress HEENT: normal Neck: no JVD, carotid bruits, or masses Cardiac: RRR; no murmurs, rubs, or gallops,no edema  Respiratory:  clear to auscultation bilaterally, normal work of breathing GI: soft, nontender, nondistended, + BS MS: no deformity or atrophy Skin: warm and dry, no rash Neuro:  Strength and sensation are intact Psych: euthymic mood, full affect   EKG:  EKG is not ordered today.    Recent Labs: 12/28/2013: ALT 22; BUN 28*; Creatinine, Ser 1.00; Potassium 4.9; Sodium 138 03/11/2014: Hemoglobin 17.0; Platelets 265    Lipid Panel    Component Value Date/Time   CHOL 113 12/29/2013 0400   TRIG 110 12/29/2013 0400   HDL 31* 12/29/2013 0400   CHOLHDL 3.6 12/29/2013 0400   VLDL 22 12/29/2013 0400   LDLCALC 60 12/29/2013 0400        Wt Readings from Last 3 Encounters:  10/13/14 228 lb 12.8 oz (103.783 kg)  06/14/14 247 lb (112.038 kg)  03/11/14 245 lb (111.131 kg)     ASSESSMENT AND PLAN:  1.  Chronic atrial fibrillation well rate controlled. - continue Diltiazem/metoprolol/warfarin  2.  HTN well controlled - continue Diltiazem/metoprolol  3.  History of CVA 4.  Chronic anticoagulation - try to keep INR at least 2.5 due to CVA 5.  Mildly dilated aortic root - check 2D echo in October   Current medicines are reviewed at length with the patient today.  The patient does not have concerns regarding medicines.  The following changes have been made:  no change  Labs/ tests ordered today: See above Assessment and Plan No orders of the defined types were placed in this encounter.     Disposition:   FU with me in 1 year  Signed, Sueanne Margarita, MD  10/13/2014 8:55 AM    [facility] [place], [place], [place]  [address] Phone: [phone]; Fax: [phone]

## 2014-10-13 ENCOUNTER — Ambulatory Visit (INDEPENDENT_AMBULATORY_CARE_PROVIDER_SITE_OTHER): Payer: Medicare Other

## 2014-10-13 ENCOUNTER — Ambulatory Visit (INDEPENDENT_AMBULATORY_CARE_PROVIDER_SITE_OTHER): Payer: Medicare Other | Admitting: Cardiology

## 2014-10-13 ENCOUNTER — Encounter: Payer: Self-pay | Admitting: Cardiology

## 2014-10-13 VITALS — BP 132/80 | HR 80 | Ht 71.5 in | Wt 228.8 lb

## 2014-10-13 DIAGNOSIS — I10 Essential (primary) hypertension: Secondary | ICD-10-CM | POA: Diagnosis not present

## 2014-10-13 DIAGNOSIS — Z7901 Long term (current) use of anticoagulants: Secondary | ICD-10-CM

## 2014-10-13 DIAGNOSIS — I77810 Thoracic aortic ectasia: Secondary | ICD-10-CM | POA: Insufficient documentation

## 2014-10-13 DIAGNOSIS — I482 Chronic atrial fibrillation, unspecified: Secondary | ICD-10-CM

## 2014-10-13 DIAGNOSIS — I638 Other cerebral infarction: Secondary | ICD-10-CM | POA: Diagnosis not present

## 2014-10-13 DIAGNOSIS — I4891 Unspecified atrial fibrillation: Secondary | ICD-10-CM

## 2014-10-13 DIAGNOSIS — I6389 Other cerebral infarction: Secondary | ICD-10-CM

## 2014-10-13 DIAGNOSIS — Z5181 Encounter for therapeutic drug level monitoring: Secondary | ICD-10-CM

## 2014-10-13 LAB — POCT INR: INR: 3.3

## 2014-10-13 NOTE — Patient Instructions (Signed)
Medication Instructions:  Your physician recommends that you continue on your current medications as directed. Please refer to the Current Medication list given to you today.   Labwork: None  Testing/Procedures: Your physician has requested that you have an echocardiogram. Echocardiography is a painless test that uses sound waves to create images of your heart. It provides your doctor with information about the size and shape of your heart and how well your heart's chambers and valves are working. This procedure takes approximately one hour. There are no restrictions for this procedure.  Follow-Up: Your physician wants you to follow-up in: 1 year with Dr. Radford Pax. You will receive a reminder letter in the mail two months in advance. If you don't receive a letter, please call our office to schedule the follow-up appointment.   Any Other Special Instructions Will Be Listed Below (If Applicable).

## 2014-11-10 ENCOUNTER — Ambulatory Visit (INDEPENDENT_AMBULATORY_CARE_PROVIDER_SITE_OTHER): Payer: Medicare Other | Admitting: *Deleted

## 2014-11-10 DIAGNOSIS — Z5181 Encounter for therapeutic drug level monitoring: Secondary | ICD-10-CM

## 2014-11-10 DIAGNOSIS — I4891 Unspecified atrial fibrillation: Secondary | ICD-10-CM

## 2014-11-10 LAB — POCT INR: INR: 2.5

## 2014-11-10 MED ORDER — WARFARIN SODIUM 5 MG PO TABS: 5.0000 mg | ORAL_TABLET | ORAL | Status: DC

## 2014-12-14 ENCOUNTER — Ambulatory Visit (INDEPENDENT_AMBULATORY_CARE_PROVIDER_SITE_OTHER): Payer: Medicare Other | Admitting: *Deleted

## 2014-12-14 DIAGNOSIS — Z5181 Encounter for therapeutic drug level monitoring: Secondary | ICD-10-CM | POA: Diagnosis not present

## 2014-12-14 DIAGNOSIS — I4891 Unspecified atrial fibrillation: Secondary | ICD-10-CM | POA: Diagnosis not present

## 2014-12-14 LAB — POCT INR: INR: 2.9

## 2014-12-28 ENCOUNTER — Other Ambulatory Visit: Payer: Self-pay

## 2014-12-28 ENCOUNTER — Ambulatory Visit (HOSPITAL_COMMUNITY): Payer: Medicare Other | Attending: Cardiology

## 2014-12-28 DIAGNOSIS — I071 Rheumatic tricuspid insufficiency: Secondary | ICD-10-CM | POA: Insufficient documentation

## 2014-12-28 DIAGNOSIS — I77810 Thoracic aortic ectasia: Secondary | ICD-10-CM | POA: Diagnosis not present

## 2014-12-28 DIAGNOSIS — E785 Hyperlipidemia, unspecified: Secondary | ICD-10-CM | POA: Insufficient documentation

## 2014-12-28 DIAGNOSIS — I517 Cardiomegaly: Secondary | ICD-10-CM | POA: Diagnosis not present

## 2014-12-28 DIAGNOSIS — I340 Nonrheumatic mitral (valve) insufficiency: Secondary | ICD-10-CM | POA: Diagnosis not present

## 2014-12-28 DIAGNOSIS — I10 Essential (primary) hypertension: Secondary | ICD-10-CM | POA: Diagnosis not present

## 2014-12-28 DIAGNOSIS — E119 Type 2 diabetes mellitus without complications: Secondary | ICD-10-CM | POA: Diagnosis not present

## 2014-12-29 ENCOUNTER — Telehealth: Payer: Self-pay

## 2014-12-29 DIAGNOSIS — I77810 Thoracic aortic ectasia: Secondary | ICD-10-CM

## 2014-12-29 DIAGNOSIS — I272 Pulmonary hypertension, unspecified: Secondary | ICD-10-CM

## 2014-12-29 NOTE — Telephone Encounter (Signed)
-----   Message from Sueanne Margarita, MD sent at 12/29/2014  9:42 AM EDT ----- Echo showed mild LVF with normal LVF, mildly dilated aortic root, mild MR and mild pulmonary HTN - repeat echo in 1 year for pulmonary HTN and dilated aortic root

## 2014-12-29 NOTE — Telephone Encounter (Signed)
Informed patient of results and verbal understanding expressed.  Repeat ECHO ordered to be scheduled in 1 year. Patient agrees with treatment plan. 

## 2015-01-25 ENCOUNTER — Ambulatory Visit (INDEPENDENT_AMBULATORY_CARE_PROVIDER_SITE_OTHER): Payer: Medicare Other | Admitting: *Deleted

## 2015-01-25 DIAGNOSIS — Z5181 Encounter for therapeutic drug level monitoring: Secondary | ICD-10-CM

## 2015-01-25 DIAGNOSIS — I4891 Unspecified atrial fibrillation: Secondary | ICD-10-CM

## 2015-01-25 LAB — POCT INR: INR: 2.2

## 2015-03-01 ENCOUNTER — Encounter: Payer: Self-pay | Admitting: Cardiology

## 2015-03-01 ENCOUNTER — Ambulatory Visit (INDEPENDENT_AMBULATORY_CARE_PROVIDER_SITE_OTHER): Payer: Medicare Other | Admitting: *Deleted

## 2015-03-01 DIAGNOSIS — Z5181 Encounter for therapeutic drug level monitoring: Secondary | ICD-10-CM

## 2015-03-01 DIAGNOSIS — I4891 Unspecified atrial fibrillation: Secondary | ICD-10-CM | POA: Diagnosis not present

## 2015-03-01 LAB — POCT INR: INR: 3.3

## 2015-03-21 ENCOUNTER — Telehealth: Payer: Self-pay | Admitting: Cardiology

## 2015-03-21 NOTE — Telephone Encounter (Signed)
Need to change manufacture of warfarin to Jantoven 5mg . Please call to give verbal ok.

## 2015-03-21 NOTE — Telephone Encounter (Signed)
Spoke with Chloe at [place] okay to change brands.

## 2015-03-29 ENCOUNTER — Ambulatory Visit (INDEPENDENT_AMBULATORY_CARE_PROVIDER_SITE_OTHER): Payer: Medicare Other | Admitting: *Deleted

## 2015-03-29 DIAGNOSIS — I4891 Unspecified atrial fibrillation: Secondary | ICD-10-CM

## 2015-03-29 DIAGNOSIS — Z5181 Encounter for therapeutic drug level monitoring: Secondary | ICD-10-CM | POA: Diagnosis not present

## 2015-03-29 LAB — POCT INR: INR: 2.9

## 2015-04-26 ENCOUNTER — Ambulatory Visit (INDEPENDENT_AMBULATORY_CARE_PROVIDER_SITE_OTHER): Payer: Medicare Other | Admitting: *Deleted

## 2015-04-26 DIAGNOSIS — Z5181 Encounter for therapeutic drug level monitoring: Secondary | ICD-10-CM

## 2015-04-26 DIAGNOSIS — I4891 Unspecified atrial fibrillation: Secondary | ICD-10-CM | POA: Diagnosis not present

## 2015-04-26 LAB — POCT INR: INR: 2.7

## 2015-05-31 ENCOUNTER — Ambulatory Visit (INDEPENDENT_AMBULATORY_CARE_PROVIDER_SITE_OTHER): Payer: Medicare Other | Admitting: *Deleted

## 2015-05-31 DIAGNOSIS — I4891 Unspecified atrial fibrillation: Secondary | ICD-10-CM

## 2015-05-31 DIAGNOSIS — Z5181 Encounter for therapeutic drug level monitoring: Secondary | ICD-10-CM | POA: Diagnosis not present

## 2015-05-31 LAB — POCT INR: INR: 2.8

## 2015-07-12 ENCOUNTER — Ambulatory Visit (INDEPENDENT_AMBULATORY_CARE_PROVIDER_SITE_OTHER): Payer: Medicare Other | Admitting: *Deleted

## 2015-07-12 DIAGNOSIS — I4891 Unspecified atrial fibrillation: Secondary | ICD-10-CM

## 2015-07-12 DIAGNOSIS — Z5181 Encounter for therapeutic drug level monitoring: Secondary | ICD-10-CM

## 2015-07-12 LAB — POCT INR: INR: 3.6

## 2015-07-20 ENCOUNTER — Other Ambulatory Visit: Payer: Self-pay | Admitting: Cardiology

## 2015-07-26 ENCOUNTER — Ambulatory Visit (INDEPENDENT_AMBULATORY_CARE_PROVIDER_SITE_OTHER): Payer: Medicare Other | Admitting: *Deleted

## 2015-07-26 DIAGNOSIS — Z5181 Encounter for therapeutic drug level monitoring: Secondary | ICD-10-CM | POA: Diagnosis not present

## 2015-07-26 DIAGNOSIS — I4891 Unspecified atrial fibrillation: Secondary | ICD-10-CM | POA: Diagnosis not present

## 2015-07-26 LAB — POCT INR: INR: 3.6

## 2015-08-01 ENCOUNTER — Other Ambulatory Visit: Payer: Self-pay | Admitting: Urology

## 2015-08-01 DIAGNOSIS — N2889 Other specified disorders of kidney and ureter: Secondary | ICD-10-CM

## 2015-08-01 DIAGNOSIS — R310 Gross hematuria: Secondary | ICD-10-CM

## 2015-08-01 DIAGNOSIS — D49519 Neoplasm of unspecified behavior of unspecified kidney: Secondary | ICD-10-CM

## 2015-08-02 ENCOUNTER — Other Ambulatory Visit: Payer: Self-pay | Admitting: Urology

## 2015-08-02 ENCOUNTER — Telehealth: Payer: Self-pay | Admitting: Cardiology

## 2015-08-02 NOTE — Telephone Encounter (Signed)
Request for surgical clearance:  1. What type of surgery is being performed? Ureteroscopy- w/ biosy  2. When is this surgery scheduled? n/a  3. Are there any medications that need to be held prior to surgery and how long? coum 5 days   4. Name of physician performing surgery? [facility]  5. What is your office phone and fax number? [phone]  6.

## 2015-08-02 NOTE — Telephone Encounter (Signed)
Patient is on Coumadin for afib with CHADS2 score of 4 (HTN, DM, and TIA in 2015). Patient can hold his Coumadin for 5 days prior to his procedure, but he will need Lovenox bridging due to his TIA. Will coordinate bridging in Coumadin clinic. Clearance faxed to [facility].

## 2015-08-09 ENCOUNTER — Ambulatory Visit (INDEPENDENT_AMBULATORY_CARE_PROVIDER_SITE_OTHER): Payer: Medicare Other | Admitting: *Deleted

## 2015-08-09 ENCOUNTER — Other Ambulatory Visit: Payer: Self-pay | Admitting: Urology

## 2015-08-09 ENCOUNTER — Ambulatory Visit
Admission: RE | Admit: 2015-08-09 | Discharge: 2015-08-09 | Disposition: A | Discharge status: Home / Self Care | Payer: Medicare Other | Source: Ambulatory Visit | Attending: Urology | Admitting: Urology

## 2015-08-09 DIAGNOSIS — D49519 Neoplasm of unspecified behavior of unspecified kidney: Secondary | ICD-10-CM

## 2015-08-09 DIAGNOSIS — Z5181 Encounter for therapeutic drug level monitoring: Secondary | ICD-10-CM

## 2015-08-09 DIAGNOSIS — I4891 Unspecified atrial fibrillation: Secondary | ICD-10-CM

## 2015-08-09 DIAGNOSIS — N2889 Other specified disorders of kidney and ureter: Secondary | ICD-10-CM

## 2015-08-09 DIAGNOSIS — R310 Gross hematuria: Secondary | ICD-10-CM

## 2015-08-09 LAB — POCT INR: INR: 1.7

## 2015-08-09 MED ORDER — GADOBENATE DIMEGLUMINE 529 MG/ML IV SOLN
20.0000 mL | Freq: Once | INTRAVENOUS | Status: AC | PRN
  Administered 2015-08-09: 20 mL via INTRAVENOUS

## 2015-08-09 MED ORDER — ENOXAPARIN SODIUM 100 MG/ML ~~LOC~~ SOLN: 100.0000 mg | Freq: Two times a day (BID) | SUBCUTANEOUS | Status: DC

## 2015-08-09 NOTE — Patient Instructions (Addendum)
08/12/2015 Last day to take coumadin 08/13/2015 no coumadin no Lovenox 08/14/2015 No coumadin Lovenox 100 mg 8am and lovenox 100 mg 8pm 08/15/2015 No coumadin Lovenox 100 mg 8am and lovenox 100 mg 8pm 08/16/2015 No coumadin Lovenox 100 mg 8am and Lovenox 100 mg 8pm 08/17/2015 no coumadin Lovenox 100 mg 8am  No Lovenox in the PM 05/26//2017 no coumadin no  Lovenox  Day of procedure and then when instructed by Dr Karsten Ro to restart coumadin and Lovenox restart at same dose Coumadin 2.5mg  daily except coumadin 5mg  on Mondays Wednesdays and Fridays and Lovenox 100 mg at 8am and lovenox 100 mg 8pm until return to office visit on May 31st

## 2015-08-14 NOTE — Progress Notes (Signed)
LOV DR TURNER CARDIO 10-13-14 EPIC ECHO 12-29-14 EPIC US DOPPLER CAROTID 12-29-13 EPIC  EVENT MONITOR REPORT 10-16 15 EPIC

## 2015-08-15 ENCOUNTER — Encounter (HOSPITAL_COMMUNITY)
Admission: RE | Admit: 2015-08-15 | Discharge: 2015-08-15 | Disposition: A | Discharge status: Home / Self Care | Payer: Medicare Other | Source: Ambulatory Visit | Attending: Urology | Admitting: Urology

## 2015-08-15 ENCOUNTER — Encounter (HOSPITAL_COMMUNITY): Payer: Self-pay

## 2015-08-15 DIAGNOSIS — E1151 Type 2 diabetes mellitus with diabetic peripheral angiopathy without gangrene: Secondary | ICD-10-CM | POA: Diagnosis not present

## 2015-08-15 DIAGNOSIS — Z87442 Personal history of urinary calculi: Secondary | ICD-10-CM | POA: Diagnosis not present

## 2015-08-15 DIAGNOSIS — R310 Gross hematuria: Secondary | ICD-10-CM | POA: Diagnosis not present

## 2015-08-15 DIAGNOSIS — I4891 Unspecified atrial fibrillation: Secondary | ICD-10-CM | POA: Diagnosis not present

## 2015-08-15 DIAGNOSIS — N359 Urethral stricture, unspecified: Secondary | ICD-10-CM | POA: Diagnosis not present

## 2015-08-15 DIAGNOSIS — Z7901 Long term (current) use of anticoagulants: Secondary | ICD-10-CM | POA: Diagnosis not present

## 2015-08-15 DIAGNOSIS — Z8673 Personal history of transient ischemic attack (TIA), and cerebral infarction without residual deficits: Secondary | ICD-10-CM | POA: Diagnosis not present

## 2015-08-15 DIAGNOSIS — Z7984 Long term (current) use of oral hypoglycemic drugs: Secondary | ICD-10-CM | POA: Diagnosis not present

## 2015-08-15 DIAGNOSIS — Z79899 Other long term (current) drug therapy: Secondary | ICD-10-CM | POA: Diagnosis not present

## 2015-08-15 DIAGNOSIS — E7800 Pure hypercholesterolemia, unspecified: Secondary | ICD-10-CM | POA: Diagnosis not present

## 2015-08-15 DIAGNOSIS — Q540 Hypospadias, balanic: Secondary | ICD-10-CM | POA: Diagnosis not present

## 2015-08-15 DIAGNOSIS — Z6832 Body mass index (BMI) 32.0-32.9, adult: Secondary | ICD-10-CM | POA: Diagnosis not present

## 2015-08-15 DIAGNOSIS — I10 Essential (primary) hypertension: Secondary | ICD-10-CM | POA: Diagnosis not present

## 2015-08-15 DIAGNOSIS — N281 Cyst of kidney, acquired: Secondary | ICD-10-CM | POA: Diagnosis not present

## 2015-08-15 DIAGNOSIS — N200 Calculus of kidney: Secondary | ICD-10-CM | POA: Diagnosis not present

## 2015-08-15 HISTORY — DX: Hematuria, unspecified: R31.9

## 2015-08-15 LAB — BASIC METABOLIC PANEL
Anion gap: 6 (ref 5–15)
BUN: 20 mg/dL (ref 6–20)
CHLORIDE: 105 mmol/L (ref 101–111)
CO2: 28 mmol/L (ref 22–32)
CREATININE: 1.24 mg/dL (ref 0.61–1.24)
Calcium: 9.3 mg/dL (ref 8.9–10.3)
GFR, EST NON AFRICAN AMERICAN: 56 mL/min — AB (ref >60)
Glucose, Bld: 100 mg/dL — ABNORMAL HIGH (ref 65–99)
POTASSIUM: 4.4 mmol/L (ref 3.5–5.1)
SODIUM: 139 mmol/L (ref 135–145)

## 2015-08-15 LAB — CBC
HCT: 49.2 % (ref 39.0–52.0)
Hemoglobin: 16.3 g/dL (ref 13.0–17.0)
MCH: 30.8 pg (ref 26.0–34.0)
MCHC: 33.1 g/dL (ref 30.0–36.0)
MCV: 93 fL (ref 78.0–100.0)
PLATELETS: 240 10*3/uL (ref 150–400)
RBC: 5.29 MIL/uL (ref 4.22–5.81)
RDW: 14.1 % (ref 11.5–15.5)
WBC: 7.7 10*3/uL (ref 4.0–10.5)

## 2015-08-15 LAB — PROTIME-INR
INR: 1.77 — AB (ref 0.00–1.49)
Prothrombin Time: 20 seconds — ABNORMAL HIGH (ref 11.6–15.2)

## 2015-08-15 NOTE — Patient Instructions (Addendum)
Norman [name]  08/15/2015   Your procedure is scheduled on: 08-18-15  Report to [facility] Main  Entrance take [facility]  elevators to 3rd floor to  [place] at 900 AM.  Call this number if you have problems the morning of surgery [phone]   Remember: ONLY 1 PERSON MAY GO WITH YOU TO SHORT STAY TO GET  READY MORNING OF Norman [name].  Do not eat food or drink liquids :After Midnight.     Take these medicines the morning of surgery with A SIP OF WATER: CARTIX XT, METOPROLOL TARTRATE (LOPRESSOR), EYE DROP DO NOT TAKE ANY DIABETIC MEDICATIONS DAY OF YOUR SURGERY                               You may not have any metal on your body including hair pins and              piercings  Do not wear jewelry, make-up, lotions, powders or perfumes, deodorant             Do not wear nail polish.  Do not shave  48 hours prior to surgery.              Men may shave face and neck.   Do not bring valuables to the hospital. [place].  Contacts, dentures or bridgework may not be worn into surgery.  Leave suitcase in the car. After surgery it may be brought to your room.     Patients discharged the day of surgery will not be allowed to drive home.  Name and phone number of your driver:Norman [name] CELL [phone]  Special Instructions: N/A              Please read over the following fact sheets you were given: _____________________________________________________________________             [facility] - Preparing for Surgery Before surgery, you can play an important role.  Because skin is not sterile, your skin needs to be as free of germs as possible.  You can reduce the number of germs on your skin by washing with CHG (chlorahexidine gluconate) soap before surgery.  CHG is an antiseptic cleaner which kills germs and bonds with the skin to continue killing germs even after washing. Please DO NOT use if you have an  allergy to CHG or antibacterial soaps.  If your skin becomes reddened/irritated stop using the CHG and inform your nurse when you arrive at Short Stay. Do not shave (including legs and underarms) for at least 48 hours prior to the first CHG shower.  You may shave your face/neck. Please follow these instructions carefully:  1.  Shower with CHG Soap the night before surgery and the  morning of Surgery.  2.  If you choose to wash your hair, wash your hair first as usual with your  normal  shampoo.  3.  After you shampoo, rinse your hair and body thoroughly to remove the  shampoo.                           4.  Use CHG as you would any other liquid soap.  You can apply  chg directly  to the skin and wash                       Gently with a scrungie or clean washcloth.  5.  Apply the CHG Soap to your body ONLY FROM THE NECK DOWN.   Do not use on face/ open                           Wound or open sores. Avoid contact with eyes, ears mouth and genitals (private parts).                       Wash face,  Genitals (private parts) with your normal soap.             6.  Wash thoroughly, paying special attention to the area where your surgery  will be performed.  7.  Thoroughly rinse your body with warm water from the neck down.  8.  DO NOT shower/wash with your normal soap after using and rinsing off  the CHG Soap.                9.  Pat yourself dry with a clean towel.            10.  Wear clean pajamas.            11.  Place clean sheets on your bed the night of your first shower and do not  sleep with pets. Day of Surgery : Do not apply any lotions/deodorants the morning of surgery.  Please wear clean clothes to the hospital/surgery center.  FAILURE TO FOLLOW THESE INSTRUCTIONS MAY RESULT IN THE CANCELLATION OF YOUR SURGERY PATIENT SIGNATURE_________________________________  NURSE SIGNATURE__________________________________  ________________________________________________________________________

## 2015-08-16 LAB — HEMOGLOBIN A1C
Hgb A1c MFr Bld: 6.2 % — ABNORMAL HIGH (ref 4.8–5.6)
Mean Plasma Glucose: 131 mg/dL

## 2015-08-18 ENCOUNTER — Ambulatory Visit (HOSPITAL_COMMUNITY)
Admission: RE | Admit: 2015-08-18 | Discharge: 2015-08-18 | Disposition: A | Discharge status: Home / Self Care | Payer: Medicare Other | Source: Ambulatory Visit | Attending: Urology | Admitting: Urology

## 2015-08-18 ENCOUNTER — Ambulatory Visit (HOSPITAL_COMMUNITY): Payer: Medicare Other | Admitting: Anesthesiology

## 2015-08-18 ENCOUNTER — Encounter (HOSPITAL_COMMUNITY): Payer: Self-pay | Admitting: *Deleted

## 2015-08-18 ENCOUNTER — Encounter (HOSPITAL_COMMUNITY)
Admission: RE | Disposition: A | Discharge status: Home / Self Care | Payer: Self-pay | Source: Ambulatory Visit | Attending: Urology

## 2015-08-18 DIAGNOSIS — E1151 Type 2 diabetes mellitus with diabetic peripheral angiopathy without gangrene: Secondary | ICD-10-CM | POA: Insufficient documentation

## 2015-08-18 DIAGNOSIS — R310 Gross hematuria: Secondary | ICD-10-CM

## 2015-08-18 DIAGNOSIS — N200 Calculus of kidney: Secondary | ICD-10-CM | POA: Diagnosis not present

## 2015-08-18 DIAGNOSIS — N359 Urethral stricture, unspecified: Secondary | ICD-10-CM | POA: Insufficient documentation

## 2015-08-18 DIAGNOSIS — Z6832 Body mass index (BMI) 32.0-32.9, adult: Secondary | ICD-10-CM | POA: Insufficient documentation

## 2015-08-18 DIAGNOSIS — Z7984 Long term (current) use of oral hypoglycemic drugs: Secondary | ICD-10-CM | POA: Insufficient documentation

## 2015-08-18 DIAGNOSIS — N281 Cyst of kidney, acquired: Secondary | ICD-10-CM | POA: Insufficient documentation

## 2015-08-18 DIAGNOSIS — Z7901 Long term (current) use of anticoagulants: Secondary | ICD-10-CM | POA: Insufficient documentation

## 2015-08-18 DIAGNOSIS — Q540 Hypospadias, balanic: Secondary | ICD-10-CM | POA: Diagnosis not present

## 2015-08-18 DIAGNOSIS — I4891 Unspecified atrial fibrillation: Secondary | ICD-10-CM | POA: Insufficient documentation

## 2015-08-18 DIAGNOSIS — I10 Essential (primary) hypertension: Secondary | ICD-10-CM | POA: Insufficient documentation

## 2015-08-18 DIAGNOSIS — Z79899 Other long term (current) drug therapy: Secondary | ICD-10-CM | POA: Insufficient documentation

## 2015-08-18 DIAGNOSIS — Z8673 Personal history of transient ischemic attack (TIA), and cerebral infarction without residual deficits: Secondary | ICD-10-CM | POA: Insufficient documentation

## 2015-08-18 DIAGNOSIS — E7800 Pure hypercholesterolemia, unspecified: Secondary | ICD-10-CM | POA: Insufficient documentation

## 2015-08-18 DIAGNOSIS — Z87442 Personal history of urinary calculi: Secondary | ICD-10-CM | POA: Insufficient documentation

## 2015-08-18 HISTORY — PX: CYSTOSCOPY WITH RETROGRADE PYELOGRAM, URETEROSCOPY AND STENT PLACEMENT: SHX5789

## 2015-08-18 LAB — GLUCOSE, CAPILLARY
Glucose-Capillary: 136 mg/dL — ABNORMAL HIGH (ref 65–99)
Glucose-Capillary: 133 mg/dL — ABNORMAL HIGH (ref 65–99)

## 2015-08-18 LAB — PROTIME-INR
INR: 1.11 (ref 0.00–1.49)
Prothrombin Time: 14.5 seconds (ref 11.6–15.2)

## 2015-08-18 SURGERY — CYSTOURETEROSCOPY, WITH RETROGRADE PYELOGRAM AND STENT INSERTION
Anesthesia: General | Laterality: Right

## 2015-08-18 MED ORDER — CIPROFLOXACIN IN D5W 400 MG/200ML IV SOLN
400.0000 mg | INTRAVENOUS | Status: AC
  Administered 2015-08-18: 400 mg via INTRAVENOUS

## 2015-08-18 MED ORDER — EPHEDRINE SULFATE 50 MG/ML IJ SOLN
INTRAMUSCULAR | Status: DC | PRN
  Administered 2015-08-18: 10 mg via INTRAVENOUS

## 2015-08-18 MED ORDER — MEPERIDINE HCL 50 MG/ML IJ SOLN: 6.2500 mg | INTRAMUSCULAR | Status: DC | PRN

## 2015-08-18 MED ORDER — FENTANYL CITRATE (PF) 100 MCG/2ML IJ SOLN
INTRAMUSCULAR | Status: DC | PRN
  Administered 2015-08-18: 50 ug via INTRAVENOUS

## 2015-08-18 MED ORDER — PHENAZOPYRIDINE HCL 200 MG PO TABS: 200.0000 mg | ORAL_TABLET | Freq: Three times a day (TID) | ORAL | Status: DC | PRN

## 2015-08-18 MED ORDER — OXYCODONE HCL 5 MG PO TABS: 5.0000 mg | ORAL_TABLET | Freq: Once | ORAL | Status: DC | PRN

## 2015-08-18 MED ORDER — LIDOCAINE HCL (CARDIAC) 20 MG/ML IV SOLN
INTRAVENOUS | Status: DC | PRN
  Administered 2015-08-18: 100 mg via INTRAVENOUS

## 2015-08-18 MED ORDER — DIATRIZOATE MEGLUMINE 30 % UR SOLN
URETHRAL | Status: AC
  Filled 2015-08-18: qty 100

## 2015-08-18 MED ORDER — EPHEDRINE SULFATE 50 MG/ML IJ SOLN
INTRAMUSCULAR | Status: AC
  Filled 2015-08-18: qty 1

## 2015-08-18 MED ORDER — PROPOFOL 10 MG/ML IV BOLUS
INTRAVENOUS | Status: DC | PRN
  Administered 2015-08-18: 200 mg via INTRAVENOUS

## 2015-08-18 MED ORDER — SODIUM CHLORIDE 0.9 % IJ SOLN
INTRAMUSCULAR | Status: AC
  Filled 2015-08-18: qty 10

## 2015-08-18 MED ORDER — DIATRIZOATE MEGLUMINE 30 % UR SOLN
URETHRAL | Status: DC | PRN
  Administered 2015-08-18: 20 mL via URETHRAL

## 2015-08-18 MED ORDER — LIDOCAINE HCL (CARDIAC) 20 MG/ML IV SOLN
INTRAVENOUS | Status: AC
  Filled 2015-08-18: qty 5

## 2015-08-18 MED ORDER — HYDROMORPHONE HCL 1 MG/ML IJ SOLN: 0.2500 mg | INTRAMUSCULAR | Status: DC | PRN

## 2015-08-18 MED ORDER — CEFAZOLIN SODIUM-DEXTROSE 2-4 GM/100ML-% IV SOLN
INTRAVENOUS | Status: AC
  Filled 2015-08-18: qty 100

## 2015-08-18 MED ORDER — PHENYLEPHRINE HCL 10 MG/ML IJ SOLN
INTRAMUSCULAR | Status: AC
  Filled 2015-08-18: qty 1

## 2015-08-18 MED ORDER — FENTANYL CITRATE (PF) 100 MCG/2ML IJ SOLN
INTRAMUSCULAR | Status: AC
  Filled 2015-08-18: qty 2

## 2015-08-18 MED ORDER — ONDANSETRON HCL 4 MG/2ML IJ SOLN
INTRAMUSCULAR | Status: DC | PRN
  Administered 2015-08-18: 4 mg via INTRAVENOUS

## 2015-08-18 MED ORDER — OXYCODONE HCL 5 MG/5ML PO SOLN
5.0000 mg | Freq: Once | ORAL | Status: DC | PRN
  Filled 2015-08-18: qty 5

## 2015-08-18 MED ORDER — 0.9 % SODIUM CHLORIDE (POUR BTL) OPTIME
TOPICAL | Status: DC | PRN
  Administered 2015-08-18: 1000 mL

## 2015-08-18 MED ORDER — SODIUM CHLORIDE 0.9 % IR SOLN
Status: DC | PRN
  Administered 2015-08-18: 1000 mL

## 2015-08-18 MED ORDER — LACTATED RINGERS IV SOLN
INTRAVENOUS | Status: DC
  Administered 2015-08-18: 11:00:00 via INTRAVENOUS

## 2015-08-18 MED ORDER — PHENYLEPHRINE HCL 10 MG/ML IJ SOLN
10.0000 mg | INTRAMUSCULAR | Status: DC | PRN
  Administered 2015-08-18: 50 ug/min via INTRAVENOUS

## 2015-08-18 MED ORDER — ONDANSETRON HCL 4 MG/2ML IJ SOLN
INTRAMUSCULAR | Status: AC
  Filled 2015-08-18: qty 2

## 2015-08-18 MED ORDER — PROPOFOL 10 MG/ML IV BOLUS
INTRAVENOUS | Status: AC
  Filled 2015-08-18: qty 20

## 2015-08-18 MED ORDER — PHENYLEPHRINE 40 MCG/ML (10ML) SYRINGE FOR IV PUSH (FOR BLOOD PRESSURE SUPPORT)
PREFILLED_SYRINGE | INTRAVENOUS | Status: AC
Start: 2015-08-18 — End: 2015-08-18
  Filled 2015-08-18: qty 10

## 2015-08-18 MED ORDER — SODIUM CHLORIDE 0.9 % IR SOLN
Status: DC | PRN
  Administered 2015-08-18: 3000 mL

## 2015-08-18 MED ORDER — CIPROFLOXACIN IN D5W 400 MG/200ML IV SOLN
INTRAVENOUS | Status: AC
Start: 2015-08-18 — End: 2015-08-18
  Filled 2015-08-18: qty 200

## 2015-08-18 MED ORDER — PHENYLEPHRINE HCL 10 MG/ML IJ SOLN
INTRAMUSCULAR | Status: DC | PRN
  Administered 2015-08-18 (×3): 80 ug via INTRAVENOUS

## 2015-08-18 MED FILL — PHENAZOPYRIDINE 200 MG TAB: 200 | 3 days supply | Qty: 10 | Fill #0

## 2015-08-18 SURGICAL SUPPLY — 16 items
BAG URO CATCHER STRL LF (MISCELLANEOUS) ×2 IMPLANT
BALLN NEPHROSTOMY (BALLOONS) ×2
BALLOON NEPHROSTOMY (BALLOONS) IMPLANT
BASKET LASER NITINOL 1.9FR (BASKET) ×1 IMPLANT
BSKT STON RTRVL 120 1.9FR (BASKET)
CATH INTERMIT  6FR 70CM (CATHETERS) ×2 IMPLANT
CATH ROBINSON RED A/P 12FR (CATHETERS) ×1 IMPLANT
CLOTH BEACON ORANGE TIMEOUT ST (SAFETY) ×2 IMPLANT
GLOVE BIOGEL M 8.0 STRL (GLOVE) ×2 IMPLANT
GOWN STRL REUS W/TWL XL LVL3 (GOWN DISPOSABLE) ×3 IMPLANT
GUIDEWIRE STR DUAL SENSOR (WIRE) ×2 IMPLANT
IV NS 1000ML (IV SOLUTION)
IV NS 1000ML BAXH (IV SOLUTION) ×1 IMPLANT
MANIFOLD NEPTUNE II (INSTRUMENTS) ×2 IMPLANT
PACK CYSTO (CUSTOM PROCEDURE TRAY) ×2 IMPLANT
TUBING CONNECTING 10 (TUBING) ×2 IMPLANT

## 2015-08-18 NOTE — H&P (Signed)
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    Gross hematuria: He recently experienced gross, painless hematuria on Coumadin for atrial fibrillation with a therapeutic INR. He has a history of stones in the past but was not having any flank pain and reports he had a prior episode of hematuria that resolved spontaneously.    He has a known history of bilateral epididymal cyst seen on ultrasound in 2005.    Elevated PVR: He was seen in the ER in 4/17 and his urine had TNTC RBCs with 3-6 white blood cells and of course he was put on antibiotics. An ultrasound PVR was checked and found to be 325 cc 15 minutes after he had voided.    1 year ago - He experienced gross hematuria which was intermittent and would occur about every 6 weeks.  4 months ago - He started having episodes of what he described as a small amount of incontinence after urinating. He said he would urinate and it would not just be a few drops but a moderate amount would wet his underwear.  2 months ago - He then experienced gross hematuria was associated with some clots.   2 weeks ago - He experienced what he described as severe dysuria that was not associated with significant hematuria. He said the pain was nothing like the stones he has had previously. He said he saw something hard sticking out of the tip of his penis and was seen in emergency room where he was told by the physician there that he had a polyp or cyst that was hard and needed to be excised by a urologist.  The night after being seen in the emergency room he said he was unable to urinate and manipulated the hard area in his penis and eventually passed a stone into the toilet.  Since that time he said he continues to have some hematuria and passage of small clots but is not having any further post void incontinence.     Interval history: He reports he saw hematuria after he left the office one time and then he  was contacted with the culture results and started on antibiotics and since that time he has not seen any further hematuria. He denies spraying or splitting of his urinary stream.    Past Medical History  Problems  1. History of atrial fibrillation (Z86.79) 2. History of diverticulitis of colon (Z87.19) 3. History of glaucoma (Z86.69) 4. History of hypercholesterolemia (Z86.39) 5. History of hypertension (Z86.79) 6. History of transient cerebral ischemia (Z86.73) 7. History of type 2 diabetes mellitus (Z86.39)     Surgical History Problems  1. History of No Surgical Problems  Current Meds 1. Atorvastatin Calcium 80 MG Oral Tablet;  Therapy: (Recorded:24Apr2017) to Recorded 2. DilTIAZem HCl ER Coated Beads 240 MG Oral Capsule Extended Release 24 Hour;  Therapy: (Recorded:24Apr2017) to Recorded 3. Lumigan 0.03 % SOLN;  Therapy: (Recorded:24Apr2017) to Recorded 4. MetFORMIN HCl - 500 MG Oral Tablet;  Therapy: (Recorded:24Apr2017) to Recorded 5. Metoprolol Tartrate 25 MG Oral Tablet;  Therapy: (Recorded:24Apr2017) to Recorded 6. Nitrofurantoin Monohyd Macro 100 MG Oral Capsule; TAKE 1 CAPSULE Every twelve  hours;  Therapy: 27Apr2017 to (Complete:04May2017)  Requested for: 27Apr2017; Last  Rx:27Apr2017 Ordered 7. Omega-3 1000 MG Oral Capsule;  Therapy: (Recorded:24Apr2017) to Recorded 8. Warfarin Sodium 5 MG Oral Tablet;  Therapy: (Recorded:24Apr2017) to Recorded 9. Zolpidem Tartrate ER 12.5 MG Oral Tablet Extended Release;  Therapy: (Recorded:24Apr2017) to Recorded  Allergies Medication  1. No Known Drug Allergies  Family History Problems  1. No pertinent family history : Mother  Social History Problems  1. Alcohol use (Z78.9) 2. Married 3. Never a smoker 4. Number of children   2 sons 52. Retired   Engineer, site Vital Signs  Height: 6 ft  Weight: 240 lb  BMI Calculated: 32.55 BSA Calculated: 2.3 Blood Pressure: 128 / 75 Heart Rate: 73  Review of  Systems Genitourinary, constitutional, skin, eye, otolaryngeal, hematologic/lymphatic, cardiovascular, pulmonary, endocrine, musculoskeletal, gastrointestinal, neurological and psychiatric system(s) were reviewed and pertinent findings if present are noted and are otherwise negative.  Genitourinary: urinary frequency, feelings of urinary urgency, dysuria, incontinence and hematuria.  Gastrointestinal: diarrhea.  Constitutional: fever and feeling tired (fatigue).  Respiratory: shortness of breath and cough.     Physical Exam Constitutional: Well nourished and well developed . No acute distress.  ENT:. The ears and nose are normal in appearance.  Neck: The appearance of the neck is normal and no neck mass is present.  Pulmonary: No respiratory distress and normal respiratory rhythm and effort.  Cardiovascular: Heart rate and rhythm are normal . No peripheral edema.  Abdomen: The abdomen is soft and nontender. No masses are palpated. No CVA tenderness. No hernias are palpable. No hepatosplenomegaly noted.  Rectal: Rectal exam demonstrates normal sphincter tone, no tenderness and no masses. Estimated prostate size is 1+. The prostate has no nodularity and is not tender. The left seminal vesicle is nonpalpable. The right seminal vesicle is nonpalpable. The perineum is normal on inspection.  Genitourinary: Examination of the penis demonstrates no discharge, no masses, no lesions and a normal meatus. The scrotum is without lesions. The right epididymis is palpably normal and non-tender. The left epididymis is palpably normal and non-tender. The right testis is non-tender and without masses. The left testis is non-tender and without masses.  Lymphatics: The femoral and inguinal nodes are not enlarged or tender.  Skin: Normal skin turgor, no visible rash and no visible skin lesions.  Neuro/Psych:. Mood and affect are appropriate.   Results/Data  The following images/tracing/specimen were independently  visualized:  CT scan as below.  The following clinical lab reports were reviewed: Marland Kitchen  The following radiology reports were reviewed: CT scan. Selected Results  AU CT-HEMATURIA PROTOCOL [id number] 12:00AM Kathie Rhodes  Test Name Result Flag Reference AU CT-HEMATURIA PROTOCOL (Report)   ** RADIOLOGY REPORT BY [facility] **   CLINICAL DATA: Gross hematuria starting 10 days ago, intermittent. History of renal calculi.  EXAM: CT ABDOMEN AND PELVIS WITHOUT AND WITH CONTRAST  TECHNIQUE: Multidetector CT imaging of the abdomen and pelvis was performed following the standard protocol before and following the bolus administration of intravenous contrast.  CONTRAST: 125 cc Isovue 300  COMPARISON: Multiple exams, including 05/25/2009 and report from 09/23/2001  FINDINGS: Lower chest: Mild to moderate cardiomegaly with circumflex and right coronary artery atherosclerotic calcification visible. Small type 1 hiatal hernia, image 14/4.  Hepatobiliary: S mild diffuse hepatic steatosis. 4 mm gallstone, image 39/3 L1.  Pancreas: Unremarkable  Spleen: Unremarkable  Adrenals/Urinary Tract: 1.3 by 1.4 cm left renal mass, absolute washout 72%, relative washout 64%, both consistent with adenoma.  Nonobstructive right renal calculi include a 4 mm upper pole calculus, a 6 mm mid kidney calculus, a 6 mm lower pole calculus, a 2 mm lower pole calculus, a 5 mm lower pole calculus, and another 2 mm lower pole calculus. Left-sided renal calculi include a 3 mm upper pole calculus, a 2  mm upper pole calculus, a 10 mm lower pole calculus, a 6 mm lower pole calculus, and an 8 mm lower pole calculus. No hydronephrosis or hydroureter. No ureteral calculus. Urinary bladder nondistended and unremarkable. No additional unexpected filling defect along the urothelium. The prominent prostate gland indents the bladder base. Portions the ureters do not completely fill.  There is a 2.3 by 1.8 cm  left mid to lower kidney hypodense lesion with about 14 Hounsfield units of increase in density between noncontrast and portal venous phase images, this is in the indeterminate range for enhancement. On delayed phase images there some unexpected filling defects in the right kidney collecting system including a bandlike filling defect along the upper pole calyx (image 79, series 900 and image 83, series 1100) as well as a low-density filling defect in a lower pole calyx adjacent to several stones (image 83, series 1100).  Several right renal hypodense lesions, including a 1.3 cm lesion along the right renal hilum on image 31/8, and a 0.6 cm right kidney lower pole lesion on image 37/8, are indeterminate due to small size/morphology.  Stomach/Bowel: The descending colon crosses the midline with the cecum in the left abdomen.  There is sigmoid colon diverticulosis along with sigmoid colon diverticulitis shown on image 83/4. No abscess or extraluminal gas.  Vascular/Lymphatic: Aortoiliac atherosclerotic vascular disease. Abnormal right external iliac lymph node 1.2 cm in short axis on image 70/4. Borderline enlarged left external iliac node 1.0 cm in short axis image 83/4. Mildly enlarged left external iliac lymph node 1.0 cm on image 76/4.  Reproductive: Prominent prostate gland at 6.6 by 5.1 cm, slight indistinctness of the margins of the seminal vesicles. Right hydrocele.  Other: No supplemental non-categorized findings.  Musculoskeletal: Small umbilical hernia contains adipose tissue. Lipoma or mild fatty atrophy of the distal medial left iliopsoas.  Multilevel endplate sclerosis in the lumbar spine most notable at the L2-3 level were there is also Schmorl's nodes. There is some bone islands in the pelvis and in the L4 vertebral body. Multilevel degenerative facet arthropathy. Minimal grade 1 retrolisthesis at L2- 3 and L3-4 with grade 1 anterolisthesis at L4-5. These  factors cause impingement at the L3-4 and L4-5 levels.  IMPRESSION: 1. There is active but mild sigmoid diverticulitis. No abscess or extraluminal gas. 2. Multiple bilateral nonobstructive renal calculi. 3. The left kidney lower pole cystic lesion has indeterminate enhancement, increase in density by 14 Hounsfield units between noncontrast and portal venous phase images. Additionally there are several right-sided renal lesions which are technically nonspecific due to size. Given the presence of these lesions, renal protocol MRI with and without contrast is recommended for further characterization. 4. There are 2 filling defects which seem unexplained by the stones in the right kidney collecting system. The first is a somewhat irregular linear filling defect in the right kidney upper pole calyx. The second is a hypodense filling defect with adjacent small calculi in the right kidney lower pole calyx. Both are visible on image 79 series 900. Differential diagnostic considerations include blood clot, fungal disease, tumor, or (in the case of the right kidney lower pole lesion) a small cyst of the tip of the medullary pyramid. These can also be assessed somewhat further at MRI although may not be well characterized at MRI. Accordingly these may require surveillance. 5. Abnormally prostate gland with slight indistinctness of the margins of the seminal vesicles, and pelvic adenopathy. The pelvic adenopathy could well be reactive due to the sigmoid diverticulitis, but strictly  speaking, prostate cancer is not excluded. Correlate with prostate screening results. 6. Other imaging findings of potential clinical significance: Coronary atherosclerosis. Small type 1 hiatal hernia. Hepatic steatosis. Small gallstone. Lumbar spondylosis and degenerative disc disease causing impingement at L3-4 and L4-5. Small umbilical hernia containing adipose tissue.   Electronically Signed  By: Van Clines M.D.  On: 07/26/2015 10:04    Test Name Result Flag Reference CREATININE 1.06 mg/dL  0.50-1.50   Procedure I attempted to proceed with cystoscopy on 08/01/15 but was only able to pass the scope into the urethral meatus and could see an area of stricturing. I therefore attempted to dilate this with Leander Rams sounds starting at 32 French but was not able and because he is currently on Coumadin and I did not want to attempt further dilation here in the office.     Assessment   I went over the results of his CT scan with him. Multiple findings were noted. 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 We discussed the benign nature of this finding.    In addition he has bilateral renal calculi. They're not causing any obstruction and he has no ureteral stones at this time.    He has bilateral renal cysts that appear to be benign as well. There did appear to be a lesion in the left kidney that is questionable and I told him that it appeared to possibly be a cyst but further evaluation is necessary with an MRI scan.    His prostate was noted to be enlarged and somewhat inhomogeneous with loss of detail between the seminal vesicles and prostate that could be due to inflammation. He had an active infective at his last visit and therefore I did not recommend obtaining a PSA but now that he has been treated one will be performed.    He was noted to have mild sigmoid diverticulitis. There were some slightly enlarged lymph nodes in the pelvis that certainly could be due to diverticulitis. He reports having no abdominal discomfort whatsoever. I told him I would supply this information to Dr. Rockwell [place].     There are, what are described by the radiologist, as filling defects in the upper and lower pole of the right kidney. One in the lower pole appears to be most likely a cyst and not really a filling defect within the collecting system. Within the upper pole the filling  defects appeared to be linear in appearance and although transitional cell carcinoma is possible this did not have a classic appearance. I therefore have discussed the need to evaluate this area with ureteroscopy. We discussed proceeding with ureteroscopic evaluation and possible biopsy with stent. I went over the procedure with him in detail including its risks and complications, the alternatives, the outpatient nature of the procedure as well as the anticipated postoperative course. He understands and is elected to proceed.    I found a fairly significant urethral stricture today. I was not able to dilate in the office and was somewhat reluctant to push it because he is on Coumadin so I have recommended managing this under anesthesia with balloon dilation.   Plan    He is scheduled for left ureteroscopy and dilatation of his urethral stricture.

## 2015-08-18 NOTE — Op Note (Signed)
PATIENT:  Norman [name]  PRE-OPERATIVE DIAGNOSIS: 1. Gross hematuria 2. Questionable right upper pole filling defect.  POST-OPERATIVE DIAGNOSIS: 1. Gross hematuria 2. Normal right intrarenal collecting system 3. Glanular hypospadias  PROCEDURE: 1. Cystoscopy with right retrograde pyelogram including interpretation 2. Retrograde urethrogram   SURGEON:  Claybon Jabs  INDICATION: Norman [name] is a 70 year old male who expands gross hematuria. He passed what sounds like a small stone and underwent a CT scan which revealed no abnormality of the bladder. Bilateral renal calculi were identified and there also appeared to be a filling defect in the upper pole calyx and in the lower pole calyx. There was a question of a right renal lesion and therefore an MRI scan of the kidneys was obtained and it revealed the lesion in the lower pole calyx was in fact a cyst in the renal papilla and that there were no filling defects in the upper pole calyceal system on the MRI scan. Attempted cystoscopy in my office was unsuccessful due to what appeared to be a distal urethral stricture so he is brought to the operating room today for dilation of his stricture, cystoscopy and possible right ureteroscopy depending on the results of his retrograde pyelogram.  ANESTHESIA:  General  EBL:  Minimal  DRAINS: None  LOCAL MEDICATIONS USED:  None  SPECIMEN:  None  Description of procedure: After informed consent the patient was taken to the operating room and placed on the table in a supine position. General anesthesia was then administered. Once fully anesthetized the patient was moved to the dorsal lithotomy position and the genitalia were sterilely prepped and draped in standard fashion. An official timeout was then performed.  I initially attempted to pass the cystoscope in through the urethral meatus but noted it would not pass and visualization revealed a blind ending urethra. Examination closely of the urethra  revealed a pit just below the urethral meatus consistent with a very distal hypospadias. I therefore initially tried to pass a 6 [place] open-ended catheter through the small opening but met some resistance so I felt evaluation with a retrograde urethrogram was indicated.  Through the 6 [place] open-ended ureteral catheter full-strength contrast was injected under fluoroscopy and this revealed a normal-appearing urethra throughout its length without evidence of stricture. I therefore passed the 0.038 inch floppy-tipped guidewire through the open-ended catheter and into the bladder under direct fluoroscopy. I left the guidewire in place and removed the ureteral catheter.  The 6 French rigid ureteroscope was then passed under direct vision next to the guidewire and into the bladder. I then removed the guidewire and passed through the ureteroscope and I visualized the right ureteral orifice.  I passed the guidewire through the ureteroscope and into the right ureteral orifice I then removed the ureteroscope and passed the 6 French open-ended catheter over the guidewire and up to the level of the renal pelvis by fluoroscopy. I removed the guidewire and then performed a right retrograde pyelogram.  Full-strength contrast was then injected through the open-ended catheter and the collecting system was visualized and noted be entirely normal. Specifically there were no filling defects within the upper pole or lower pole calyceal system. Because of the normal finding on MRI scan and on his retrograde pyelogram I did not feel ureteroscopic visualization was indicated.  I removed the open-ended catheter and reinserted the ureteroscope and used this to perform cystoscopy visualizing all of the bladder and noting the ureteral orifices were of normal configuration and position. The mucosa  appeared normal and I found no tumors, stones or inflammatory lesions within the bladder. I then removed the ureteroscope and passed a 12  French red rubber catheter through the urethral meatus and into the bladder and drained his bladder completely. I did not feel any form of dilatation was necessary and therefore removed the red rubber catheter and the patient was awakened and taken to the recovery room in stable and satisfactory condition. There were no intraoperative complications and he tolerated procedure well.  PLAN OF CARE: Discharge to home after PACU  PATIENT DISPOSITION:  PACU - hemodynamically stable.

## 2015-08-18 NOTE — Progress Notes (Signed)
Patient questions whether he should restart his lovenox after urological surgery. Paged Dr Karsten Ro. Dr Karsten Ro requested that patient follow up with his coumadin clinic.

## 2015-08-18 NOTE — Progress Notes (Signed)
Ambulated to BR w asst. Tolerated well. Able to void

## 2015-08-18 NOTE — Anesthesia Preprocedure Evaluation (Signed)
Anesthesia Evaluation  Patient identified by MRN, date of birth, ID band Patient awake    Reviewed: Allergy & Precautions, NPO status , Patient's Chart, lab work & pertinent test results  Airway Mallampati: I  TM Distance: >3 FB Neck ROM: Full    Dental  (+) Teeth Intact, Dental Advisory Given   Pulmonary    breath sounds clear to auscultation       Cardiovascular hypertension, Pt. on medications and Pt. on home beta blockers + Peripheral Vascular Disease   Rhythm:Regular Rate:Normal     Neuro/Psych TIA   GI/Hepatic   Endo/Other  diabetes, Well Controlled, Type 2, Oral Hypoglycemic AgentsMorbid obesity  Renal/GU      Musculoskeletal   Abdominal   Peds  Hematology   Anesthesia Other Findings   Reproductive/Obstetrics                             Anesthesia Physical Anesthesia Plan  ASA: III  Anesthesia Plan: General   Post-op Pain Management:    Induction: Intravenous  Airway Management Planned: LMA  Additional Equipment:   Intra-op Plan:   Post-operative Plan: Extubation in OR  Informed Consent: I have reviewed the patients History and Physical, chart, labs and discussed the procedure including the risks, benefits and alternatives for the proposed anesthesia with the patient or authorized representative who has indicated his/her understanding and acceptance.   Dental advisory given  Plan Discussed with: CRNA, Anesthesiologist and Surgeon  Anesthesia Plan Comments:         Anesthesia Quick Evaluation

## 2015-08-18 NOTE — Anesthesia Procedure Notes (Signed)
Procedure Name: LMA Insertion Date/Time: 08/18/2015 11:19 AM Performed by: Danley Danker L Patient Re-evaluated:Patient Re-evaluated prior to inductionOxygen Delivery Method: Circle system utilized Preoxygenation: Pre-oxygenation with 100% oxygen Intubation Type: IV induction Ventilation: Mask ventilation without difficulty LMA: LMA inserted LMA Size: 5.0 Number of attempts: 1 Placement Confirmation: positive ETCO2 and breath sounds checked- equal and bilateral Tube secured with: Tape Dental Injury: Teeth and Oropharynx as per pre-operative assessment

## 2015-08-18 NOTE — Discharge Instructions (Signed)
Cystoscopy patient instructions  Following a cystoscopy, a catheter (a flexible rubber tube) is sometimes left in place to empty the bladder. This may cause some discomfort or a feeling that you need to urinate. Your doctor determines the period of time that the catheter will be left in place. You may have bloody urine for two to three days (Call your doctor if the amount of bleeding increases or does not subside).  You may pass blood clots in your urine, especially if you had a biopsy. It is not unusual to pass small blood clots and have some bloody urine a couple of weeks after your cystoscopy. Again, call your doctor if the bleeding does not subside. You may have: Dysuria (painful urination) Frequency (urinating often) Urgency (strong desire to urinate)  These symptoms are common especially if medicine is instilled into the bladder or a ureteral stent is placed. Avoiding alcohol and caffeine, such as coffee, tea, and chocolate, may help relieve these symptoms. Drink plenty of water, unless otherwise instructed. Your doctor may also prescribe an antibiotic or other medicine to reduce these symptoms.  Cystoscopy results are available soon after the procedure; biopsy results usually take two to four days. Your doctor will discuss the results of your exam with you. Before you go home, you will be given specific instructions for follow-up care. Special Instructions:   If you are going home with a catheter in place do not take a tub bath until removed by your doctor.   You may resume your normal activities.   Do not drive or operate machinery if you are taking narcotic pain medicine.   Be sure to keep all follow-up appointments with your doctor.   You may restart your Coumadin tomorrow.   Call Your Doctor If: The catheter is not draining  You have severe pain  You are unable to urinate  You have a fever over 101  You have severe bleeding             General Anesthesia,  Adult, Care After Refer to this sheet in the next few weeks. These instructions provide you with information on caring for yourself after your procedure. Your health care provider may also give you more specific instructions. Your treatment has been planned according to current medical practices, but problems sometimes occur. Call your health care provider if you have any problems or questions after your procedure. WHAT TO EXPECT AFTER THE PROCEDURE After the procedure, it is typical to experience:  Sleepiness.  Nausea and vomiting. HOME CARE INSTRUCTIONS  For the first 24 hours after general anesthesia:  Have a responsible person with you.  Do not drive a car. If you are alone, do not take public transportation.  Do not drink alcohol.  Do not take medicine that has not been prescribed by your health care provider.  Do not sign important papers or make important decisions.  You may resume a normal diet and activities as directed by your health care provider.  Change bandages (dressings) as directed.  If you have questions or problems that seem related to general anesthesia, call the hospital and ask for the anesthetist or anesthesiologist on call. SEEK MEDICAL CARE IF:  You have nausea and vomiting that continue the day after anesthesia.  You develop a rash. SEEK IMMEDIATE MEDICAL CARE IF:   You have difficulty breathing.  You have chest pain.  You have any allergic problems.   This information is not intended to replace advice given to you by your  health care provider. Make sure you discuss any questions you have with your health care provider.   Document Released: 06/17/2000 Document Revised: 04/01/2014 Document Reviewed: 07/10/2011 Elsevier Interactive Patient Education [employer].

## 2015-08-18 NOTE — Anesthesia Postprocedure Evaluation (Signed)
Anesthesia Post Note  Patient: Norman [name]  Procedure(s) Performed: Procedure(s) (LRB): CYSTOSCOPY WITH RIGHT  RETROGRADE PYELOGRAM, RIGHT URETEROGRAM. (Right)  Patient location during evaluation: PACU Anesthesia Type: General Level of consciousness: awake and alert Pain management: pain level controlled Vital Signs Assessment: post-procedure vital signs reviewed and stable Respiratory status: spontaneous breathing, nonlabored ventilation and respiratory function stable Cardiovascular status: blood pressure returned to baseline and stable Postop Assessment: no signs of nausea or vomiting Anesthetic complications: no    Last Vitals:  Filed Vitals:   08/18/15 1210 08/18/15 1222  BP: 109/72 119/76  Pulse: 67 71  Temp: 36.9 C 36.6 C  Resp: 11 16    Last Pain: There were no vitals filed for this visit.               Joanette Silveria A

## 2015-08-18 NOTE — Transfer of Care (Signed)
Immediate Anesthesia Transfer of Care Note  Patient: Norman [name]  Procedure(s) Performed: Procedure(s): CYSTOSCOPY WITH RIGHT  RETROGRADE PYELOGRAM, RIGHT URETEROGRAM. (Right)  Patient Location: PACU  Anesthesia Type:General  Level of Consciousness: awake  Airway & Oxygen Therapy: Patient Spontanous Breathing and Patient connected to face mask oxygen  Post-op Assessment: Report given to RN and Post -op Vital signs reviewed and stable  Post vital signs: Reviewed and stable  Last Vitals:  Filed Vitals:   08/18/15 0907  BP: 147/93  Pulse: 79  Temp: 36.6 C  Resp: 18    Last Pain: There were no vitals filed for this visit.    Patients Stated Pain Goal:  (unable) ([id number])  Complications: No apparent anesthesia complications

## 2015-08-23 ENCOUNTER — Ambulatory Visit (INDEPENDENT_AMBULATORY_CARE_PROVIDER_SITE_OTHER): Payer: Medicare Other | Admitting: *Deleted

## 2015-08-23 DIAGNOSIS — I482 Chronic atrial fibrillation, unspecified: Secondary | ICD-10-CM

## 2015-08-23 DIAGNOSIS — I4891 Unspecified atrial fibrillation: Secondary | ICD-10-CM | POA: Diagnosis not present

## 2015-08-23 DIAGNOSIS — I638 Other cerebral infarction: Secondary | ICD-10-CM

## 2015-08-23 DIAGNOSIS — I6389 Other cerebral infarction: Secondary | ICD-10-CM

## 2015-08-23 DIAGNOSIS — Z5181 Encounter for therapeutic drug level monitoring: Secondary | ICD-10-CM

## 2015-08-23 LAB — POCT INR: INR: 1.2

## 2015-08-28 ENCOUNTER — Ambulatory Visit (INDEPENDENT_AMBULATORY_CARE_PROVIDER_SITE_OTHER): Payer: Medicare Other | Admitting: *Deleted

## 2015-08-28 DIAGNOSIS — I482 Chronic atrial fibrillation, unspecified: Secondary | ICD-10-CM

## 2015-08-28 DIAGNOSIS — I638 Other cerebral infarction: Secondary | ICD-10-CM

## 2015-08-28 DIAGNOSIS — Z5181 Encounter for therapeutic drug level monitoring: Secondary | ICD-10-CM

## 2015-08-28 DIAGNOSIS — I4891 Unspecified atrial fibrillation: Secondary | ICD-10-CM | POA: Diagnosis not present

## 2015-08-28 DIAGNOSIS — I6389 Other cerebral infarction: Secondary | ICD-10-CM

## 2015-08-28 LAB — POCT INR: INR: 2.3

## 2015-09-11 ENCOUNTER — Ambulatory Visit (INDEPENDENT_AMBULATORY_CARE_PROVIDER_SITE_OTHER): Payer: Medicare Other | Admitting: *Deleted

## 2015-09-11 DIAGNOSIS — I638 Other cerebral infarction: Secondary | ICD-10-CM | POA: Diagnosis not present

## 2015-09-11 DIAGNOSIS — Z5181 Encounter for therapeutic drug level monitoring: Secondary | ICD-10-CM | POA: Diagnosis not present

## 2015-09-11 DIAGNOSIS — I482 Chronic atrial fibrillation, unspecified: Secondary | ICD-10-CM

## 2015-09-11 DIAGNOSIS — I4891 Unspecified atrial fibrillation: Secondary | ICD-10-CM

## 2015-09-11 DIAGNOSIS — I6389 Other cerebral infarction: Secondary | ICD-10-CM

## 2015-09-11 LAB — POCT INR: INR: 2.9

## 2015-10-13 ENCOUNTER — Encounter: Payer: Self-pay | Admitting: Cardiology

## 2015-10-13 ENCOUNTER — Ambulatory Visit (INDEPENDENT_AMBULATORY_CARE_PROVIDER_SITE_OTHER): Payer: Medicare Other | Admitting: Surgery

## 2015-10-13 ENCOUNTER — Ambulatory Visit (INDEPENDENT_AMBULATORY_CARE_PROVIDER_SITE_OTHER): Payer: Medicare Other | Admitting: Cardiology

## 2015-10-13 VITALS — BP 118/78 | HR 66 | Ht 72.0 in | Wt 232.0 lb

## 2015-10-13 DIAGNOSIS — I77810 Thoracic aortic ectasia: Secondary | ICD-10-CM | POA: Diagnosis not present

## 2015-10-13 DIAGNOSIS — I4891 Unspecified atrial fibrillation: Secondary | ICD-10-CM | POA: Diagnosis not present

## 2015-10-13 DIAGNOSIS — E785 Hyperlipidemia, unspecified: Secondary | ICD-10-CM | POA: Diagnosis not present

## 2015-10-13 DIAGNOSIS — I482 Chronic atrial fibrillation, unspecified: Secondary | ICD-10-CM

## 2015-10-13 DIAGNOSIS — I10 Essential (primary) hypertension: Secondary | ICD-10-CM

## 2015-10-13 DIAGNOSIS — I6389 Other cerebral infarction: Secondary | ICD-10-CM

## 2015-10-13 DIAGNOSIS — Z5181 Encounter for therapeutic drug level monitoring: Secondary | ICD-10-CM

## 2015-10-13 DIAGNOSIS — I638 Other cerebral infarction: Secondary | ICD-10-CM

## 2015-10-13 LAB — POCT INR: INR: 2.5

## 2015-10-13 NOTE — Patient Instructions (Addendum)
Medication Instructions:  Your physician recommends that you continue on your current medications as directed. Please refer to the Current Medication list given to you today.  --- If you need a refill on your cardiac medications before your next appointment, please call your pharmacy. ---  Labwork: None ordered  Testing/Procedures: None ordered  Follow-Up: Your physician wants you to follow-up in: 1 year with Dr. Radford Pax.  You will receive a reminder letter in the mail two months in advance. If you don't receive a letter, please call our office to schedule the follow-up appointment.   Any Other Special Instructions Will Be Listed Below (If Applicable).  Thank you for choosing [facility]!!

## 2015-10-13 NOTE — Progress Notes (Signed)
Cardiology Office Note    Date:  10/13/2015   ID:  LAYLA EVARISTO, DOB 1945-09-08, MRN [id number]  PCP:  Gara Kroner, MD  Cardiologist:  Fransico Him, MD   Chief Complaint  Patient presents with  . Atrial Fibrillation  . Hypertension    History of Present Illness:  ACESON KUHLMANN is a 70 y.o. male with a history of Chronic atrial fibrillation, carotid artery stenosis and CVA followed by Neuro, hyperlipidemia and dilated aortic root and HTN who presents today for followup. He is doing well except for a kidney stone.   He denies any chest pain, SOB, DOE, LE edema, dizziness (unless getting up too quick), palpitations or syncope.     Past Medical History  Diagnosis Date  . Hypercholesteremia   . Glaucoma   . Hypertension   . Tendinitis     History in Rotator cuff  . Epididymal cyst 2005    Bilateral and small bilateral hydroceles with most recent ultra sound  . Arthritis of right ankle   . BPH (benign prostatic hypertrophy)     Mild  . Kidney stones     "I've passed them all"  . Type II diabetes mellitus ([place])   . TIA (transient ischemic attack) ?12/28/2013  . Chronic atrial fibrillation ([place])   . Migraine     "got off caffeine; they went away"  . Cold 07-14-15    RESOLVED NOW  . Blood in urine 2 1/2 WEEKS AGO  . Dilated aortic root ([place])     1mm by echo 11/2014    Past Surgical History  Procedure Laterality Date  . Tonsillectomy    . Cardioversion  X 1  . Cystoscopy with retrograde pyelogram, ureteroscopy and stent placement Right 08/18/2015    Procedure: CYSTOSCOPY WITH RIGHT  RETROGRADE PYELOGRAM, RIGHT URETEROGRAM.;  Surgeon: Kathie Rhodes, MD;  Location: [facility];  Service: Urology;  Laterality: Right;    Current Medications: Outpatient Prescriptions Prior to Visit  Medication Sig Dispense Refill  . atorvastatin (LIPITOR) 80 MG tablet Take 80 mg by mouth every evening.     . bimatoprost (LUMIGAN) 0.03 % ophthalmic solution Place 1 drop into both eyes at  bedtime.     Marland Kitchen CARTIA XT 240 MG 24 hr capsule Take 240 mg by mouth daily.    . COMBIGAN 0.2-0.5 % ophthalmic solution Place 1 drop into both eyes 2 (two) times daily.    . metFORMIN (GLUCOPHAGE) 500 MG tablet Take 500 mg by mouth daily with breakfast.    . metoprolol tartrate (LOPRESSOR) 25 MG tablet Take 25 mg by mouth 2 (two) times daily.    Marland Kitchen warfarin (JANTOVEN) 5 MG tablet Take 2.5-5 mg by mouth daily at 6 PM. Take half a tablet on Tuesday, Thursday, Saturday, Sunday and one tablet on Monday, Wednesday, Friday.    . zolpidem (AMBIEN CR) 12.5 MG CR tablet Take 12.5 mg by mouth at bedtime as needed for sleep.    Marland Kitchen enoxaparin (LOVENOX) 100 MG/ML injection Inject 1 mL (100 mg total) into the skin every 12 (twelve) hours. 20 Syringe 0   No facility-administered medications prior to visit.     Allergies:   Review of patient's allergies indicates no known allergies.   Social History   Social History  . Marital Status: Married    Spouse Name: N/A  . Number of Children: 2  . Years of Education: college   Social History Main Topics  . Smoking status: Never Smoker   .  Smokeless tobacco: Never Used  . Alcohol Use: 0.6 oz/week    1 Glasses of wine per week     Comment: occ  . Drug Use: No  . Sexual Activity: Not Asked   Other Topics Concern  . None   Social History Narrative   Patient is married with 2 children.   Patient is right handed.   Patient has college education.   Patient drinks very little caffeine.     Family History:  The patient's family history includes Diabetes in his maternal aunt; Heart attack in his father; Kidney failure in his mother.   ROS:   Please see the history of present illness.    Review of Systems  Genitourinary: Positive for hematuria.  Neurological: Positive for loss of balance.   All other systems reviewed and are negative.   PHYSICAL EXAM:   VS:  BP 118/78 mmHg  Pulse 66  Ht 6' (1.829 m)  Wt 232 lb (105.235 kg)  BMI 31.46 kg/m2   GEN:  Well nourished, well developed, in no acute distress HEENT: normal Neck: no JVD, carotid bruits, or masses Cardiac: RRR; no murmurs, rubs, or gallops,no edema.  Intact distal pulses bilaterally.  Respiratory:  clear to auscultation bilaterally, normal work of breathing GI: soft, nontender, nondistended, + BS MS: no deformity or atrophy Skin: warm and dry, no rash Neuro:  Alert and Oriented x 3, Strength and sensation are intact Psych: euthymic mood, full affect  Wt Readings from Last 3 Encounters:  10/13/15 232 lb (105.235 kg)  08/18/15 229 lb (103.874 kg)  08/15/15 229 lb (103.874 kg)      Studies/Labs Reviewed:   EKG:  EKG is not ordered today.    Recent Labs: 08/15/2015: BUN 20; Creatinine, Ser 1.24; Hemoglobin 16.3; Platelets 240; Potassium 4.4; Sodium 139   Lipid Panel    Component Value Date/Time   CHOL 113 12/29/2013 0400   TRIG 110 12/29/2013 0400   HDL 31* 12/29/2013 0400   CHOLHDL 3.6 12/29/2013 0400   VLDL 22 12/29/2013 0400   LDLCALC 60 12/29/2013 0400    Additional studies/ records that were reviewed today include:  none    ASSESSMENT:    1. Chronic atrial fibrillation ([place])   2. Dilated aortic root ([place])   3. Essential hypertension, benign   4. HLD (hyperlipidemia)      PLAN:  In order of problems listed above:  1. Chronic atrial fibrillation rate controlled.  Continue BB/CCB and warfarin. 2. Dilated aortic root - repeat echo 11/2015. 3. HTN - BP controlled on current medical regimen.  Continue Cartia and BB.   4. Hyperlipidemia - continue statin.  Followed by her PCP.    Medication Adjustments/Labs and Tests Ordered: Current medicines are reviewed at length with the patient today.  Concerns regarding medicines are outlined above.  Medication changes, Labs and Tests ordered today are listed in the Patient Instructions below.  Patient Instructions  Medication Instructions:  Your physician recommends that you continue on your current  medications as directed. Please refer to the Current Medication list given to you today.  --- If you need a refill on your cardiac medications before your next appointment, please call your pharmacy. ---  Labwork: None ordered  Testing/Procedures: None ordered  Follow-Up:   Any Other Special Instructions Will Be Listed Below (If Applicable).  Thank you for choosing [facility]!!              Signed, Fransico Him, MD  10/13/2015 8:34 AM  [facility] [address], [place], [place]  [address] Phone: [phone]; Fax: [phone]

## 2015-11-17 ENCOUNTER — Encounter (INDEPENDENT_AMBULATORY_CARE_PROVIDER_SITE_OTHER): Payer: Self-pay

## 2015-11-17 ENCOUNTER — Ambulatory Visit (INDEPENDENT_AMBULATORY_CARE_PROVIDER_SITE_OTHER): Payer: Medicare Other | Admitting: *Deleted

## 2015-11-17 DIAGNOSIS — I638 Other cerebral infarction: Secondary | ICD-10-CM

## 2015-11-17 DIAGNOSIS — I482 Chronic atrial fibrillation, unspecified: Secondary | ICD-10-CM

## 2015-11-17 DIAGNOSIS — I6389 Other cerebral infarction: Secondary | ICD-10-CM

## 2015-11-17 DIAGNOSIS — Z5181 Encounter for therapeutic drug level monitoring: Secondary | ICD-10-CM | POA: Diagnosis not present

## 2015-11-17 DIAGNOSIS — I4891 Unspecified atrial fibrillation: Secondary | ICD-10-CM | POA: Diagnosis not present

## 2015-11-17 LAB — POCT INR: INR: 2.3

## 2015-12-15 ENCOUNTER — Encounter (INDEPENDENT_AMBULATORY_CARE_PROVIDER_SITE_OTHER): Payer: Self-pay

## 2015-12-15 ENCOUNTER — Ambulatory Visit (INDEPENDENT_AMBULATORY_CARE_PROVIDER_SITE_OTHER): Payer: Medicare Other | Admitting: *Deleted

## 2015-12-15 DIAGNOSIS — I4891 Unspecified atrial fibrillation: Secondary | ICD-10-CM

## 2015-12-15 DIAGNOSIS — I482 Chronic atrial fibrillation, unspecified: Secondary | ICD-10-CM

## 2015-12-15 DIAGNOSIS — Z5181 Encounter for therapeutic drug level monitoring: Secondary | ICD-10-CM

## 2015-12-15 LAB — POCT INR: INR: 2.2

## 2015-12-29 ENCOUNTER — Ambulatory Visit (INDEPENDENT_AMBULATORY_CARE_PROVIDER_SITE_OTHER): Payer: Medicare Other

## 2015-12-29 DIAGNOSIS — I482 Chronic atrial fibrillation, unspecified: Secondary | ICD-10-CM

## 2015-12-29 DIAGNOSIS — Z5181 Encounter for therapeutic drug level monitoring: Secondary | ICD-10-CM | POA: Diagnosis not present

## 2015-12-29 DIAGNOSIS — I4891 Unspecified atrial fibrillation: Secondary | ICD-10-CM

## 2015-12-29 LAB — POCT INR: INR: 3.1

## 2016-01-05 ENCOUNTER — Other Ambulatory Visit: Payer: Self-pay

## 2016-01-05 ENCOUNTER — Ambulatory Visit (HOSPITAL_COMMUNITY): Payer: Medicare Other | Attending: Cardiology

## 2016-01-05 ENCOUNTER — Encounter: Payer: Self-pay | Admitting: Cardiology

## 2016-01-05 DIAGNOSIS — Z7984 Long term (current) use of oral hypoglycemic drugs: Secondary | ICD-10-CM | POA: Insufficient documentation

## 2016-01-05 DIAGNOSIS — E785 Hyperlipidemia, unspecified: Secondary | ICD-10-CM | POA: Diagnosis not present

## 2016-01-05 DIAGNOSIS — I071 Rheumatic tricuspid insufficiency: Secondary | ICD-10-CM | POA: Diagnosis not present

## 2016-01-05 DIAGNOSIS — I119 Hypertensive heart disease without heart failure: Secondary | ICD-10-CM | POA: Diagnosis not present

## 2016-01-05 DIAGNOSIS — Z7901 Long term (current) use of anticoagulants: Secondary | ICD-10-CM | POA: Insufficient documentation

## 2016-01-05 DIAGNOSIS — I059 Rheumatic mitral valve disease, unspecified: Secondary | ICD-10-CM | POA: Diagnosis not present

## 2016-01-05 DIAGNOSIS — I272 Pulmonary hypertension, unspecified: Secondary | ICD-10-CM | POA: Insufficient documentation

## 2016-01-05 DIAGNOSIS — I77810 Thoracic aortic ectasia: Secondary | ICD-10-CM | POA: Insufficient documentation

## 2016-01-05 DIAGNOSIS — Z79899 Other long term (current) drug therapy: Secondary | ICD-10-CM | POA: Insufficient documentation

## 2016-01-10 ENCOUNTER — Telehealth: Payer: Self-pay | Admitting: Cardiology

## 2016-01-10 NOTE — Telephone Encounter (Signed)
Follow Up:; ° ° °Returning your call. °

## 2016-01-10 NOTE — Telephone Encounter (Signed)
Notes Recorded by Sueanne Margarita, MD on 01/05/2016 at 4:43 PM EDT Aortic root normal size ------  Notes Recorded by Sueanne Margarita, MD on 01/05/2016 at 4:42 PM EDT Echo showed normal LVF with mildly thickened heart muscle, mildly enlarged LA and RA   Informed patient of results and verbal understanding expressed.  Patient to have fasting labs drawn 11/3 at PCP.  Will call and request labs after they are drawn. Patient understands he will be called if Dr. Radford Pax has further recommendations after received. He agrees with treatment plan.

## 2016-01-10 NOTE — Telephone Encounter (Signed)
-----   Message from Sueanne Margarita, MD sent at 01/05/2016  4:47 PM EDT ----- Also he had coronary artery calcifications noted on chest CT in 2011 with normal nuclear stress test at that time . Please get last lipid panel from PCP

## 2016-01-19 ENCOUNTER — Ambulatory Visit (INDEPENDENT_AMBULATORY_CARE_PROVIDER_SITE_OTHER): Payer: Medicare Other | Admitting: *Deleted

## 2016-01-19 DIAGNOSIS — I4891 Unspecified atrial fibrillation: Secondary | ICD-10-CM | POA: Diagnosis not present

## 2016-01-19 DIAGNOSIS — I482 Chronic atrial fibrillation, unspecified: Secondary | ICD-10-CM

## 2016-01-19 DIAGNOSIS — Z5181 Encounter for therapeutic drug level monitoring: Secondary | ICD-10-CM | POA: Diagnosis not present

## 2016-01-19 LAB — POCT INR: INR: 2.8

## 2016-01-26 ENCOUNTER — Encounter: Payer: Self-pay | Admitting: Cardiology

## 2016-01-29 NOTE — Telephone Encounter (Signed)
Labs requested from PCP. 

## 2016-01-29 NOTE — Telephone Encounter (Signed)
Labs received and placed in Medical Records' "to be scanned" folder.

## 2016-02-12 ENCOUNTER — Ambulatory Visit: Payer: Medicare Other | Attending: Family Medicine | Admitting: Rehabilitative and Restorative Service Providers"

## 2016-02-12 DIAGNOSIS — R2689 Other abnormalities of gait and mobility: Secondary | ICD-10-CM | POA: Diagnosis not present

## 2016-02-12 DIAGNOSIS — M6281 Muscle weakness (generalized): Secondary | ICD-10-CM | POA: Insufficient documentation

## 2016-02-12 NOTE — Patient Instructions (Signed)
IF YOU ARE SORE, TAKE A DAY OFF OF EXERCISE CAN INCREASE TO 2 SETS/DAY AS LONG AS NO PAIN/SWELLING OF ANKLE.  ANKLE: Eversion, Unilateral (Band)    Place band around left foot. Keeping heel in place, raise toes of banded foot up and away from body. Do not move hip. Hold _3-5__ seconds. Use ____red____ band. _10__ reps per set, _1__ sets per day, _5__ days per week   Copyright  VHI. All rights reserved.   ANKLE: Inversion, Unilateral (Band)    Placing band around left foot. Keeping heel in place, lift toes of banded foot up and in. Do not move hip. Hold _3-5__ seconds. Use ___RED_____ band. _10__ reps per set, _1__ sets per day, _5__ days per week  Copyright  VHI. All rights reserved.   ANKLE: Pump With Resistance Band    With exercise band around arch of involved foot, bend foot and toes toward floor against the band's resistance. Repeat _10__ times. Do _1__ set per day.  Copyright  VHI. All rights reserved.   ANKLE: Dorsiflexion (Band)    Sit at edge of surface. Place band around top of foot. Keeping heel on floor, raise toes of banded foot. Hold __3-5_ seconds. Use ____red____ band. _10__ reps per set, _1__ sets per day, __5_ days per week  Copyright  VHI. All rights reserved.   Feet Together, Varied Arm Positions - Eyes Closed    Stand with feet together and arms out. Close eyes and visualize upright position. Hold __30__ seconds. Repeat __3__ times per session. Do __2__ sessions per day.  Copyright  VHI. All rights reserved.   Feet Partial Heel-Toe, Head Motion - Eyes Open    With eyes open, right foot partially in front of the other *make sure toes of each foot are pointing forward*, move head slowly: side to side Repeat _10_head turns per session. Do __2__ sessions per day.  Copyright  VHI. All rights reserved.

## 2016-02-13 NOTE — Therapy (Signed)
[facility] [address] [place], [place], [address] Phone: [phone]   Fax:  [phone]  Physical Therapy Evaluation  Patient Details  Name: Norman [name] MRN: [id number] Date of Birth: 1945-06-05 Referring Provider: Antony Contras, MD  Encounter Date: 02/12/2016      PT End of Session - 02/12/16 1015    Visit Number 1   Number of Visits 4   Date for PT Re-Evaluation 03/13/16   Authorization Type G code every 10th visit   PT Start Time 0930   PT Stop Time 1015   PT Time Calculation (min) 45 min   Activity Tolerance Patient tolerated treatment well   Behavior During Therapy [facility] for tasks assessed/performed      Past Medical History:  Diagnosis Date  . Arthritis of right ankle   . Blood in urine 2 1/2 WEEKS AGO  . BPH (benign prostatic hypertrophy)    Mild  . Chronic atrial fibrillation ([place])   . Cold 07-14-15   RESOLVED NOW  . Coronary artery calcification seen on CAT scan 2011   no ischemia on nuclear stress test 2011  . Dilated aortic root ([place])    38mm by echo 11/2014  . Epididymal cyst 2005   Bilateral and small bilateral hydroceles with most recent ultra sound  . Glaucoma   . Hypercholesteremia   . Hypertension   . Kidney stones    "I've passed them all"  . Migraine    "got off caffeine; they went away"  . Tendinitis    History in Rotator cuff  . TIA (transient ischemic attack) ?12/28/2013  . Type II diabetes mellitus ([place])     Past Surgical History:  Procedure Laterality Date  . CARDIOVERSION  X 1  . CYSTOSCOPY WITH RETROGRADE PYELOGRAM, URETEROSCOPY AND STENT PLACEMENT Right 08/18/2015   Procedure: CYSTOSCOPY WITH RIGHT  RETROGRADE PYELOGRAM, RIGHT URETEROGRAM.;  Surgeon: Kathie Rhodes, MD;  Location: [facility];  Service: Urology;  Laterality: Right;  . TONSILLECTOMY      There were no vitals filed for this visit.       Subjective Assessment - 02/12/16 0926    Subjective The patient  notes imbalance that is worsening over the past 2 years.  He has been looking up exercises and notes that his right ankle limits his participation in home activities.  He notes dec'd right leg stance and feels he has to be careful "not to go down" when on unlevel/ inclining surfaces.  He notes his ankle will roll at times too, especially when on unlevel surfaces.  He has not had any falls.   He goes to the gym, but is avoiding treadmill and elliptical and doing bike instead.  Initial R ankle injury was in high school and notes it got aggravated t/o the years.  When he has attempted ankle exercises, he notes he does get pain.  He also notes swelling "when I use it too much", he has tried some ankle braces with some benefit.     Pertinent History HTN, diabetes (no neuropathy per patient report), CVA 12/2013, glaucoma, a-fib, R ankle arthritis   Patient Stated Goals "I'd like to find out what exercises are recommended to lessen the effect of feeling off center"   Currently in Pain? No/denies  only after walking longer times--does not have daily pain in ankle--only when pushing himself.            [facility] PT Assessment - 02/12/16 [id number]  Assessment   Medical Diagnosis abnormality of gait   Referring Provider Antony Contras, MD   Onset Date/Surgical Date --  over past 2 years, declining   Prior Therapy none     Precautions   Precautions None     Restrictions   Weight Bearing Restrictions No     Balance Screen   Has the patient fallen in the past 6 months No   Has the patient had a decrease in activity level because of a fear of falling?  No   Is the patient reluctant to leave their home because of a fear of falling?  No     Home Ecologist residence   Living Arrangements Spouse/significant other   Home Access Stairs to enter   Entrance Stairs-Number of Steps 1   [place] One level   [place] None     Prior Function   Level of Independence  Independent   Vocation Part time employment  works in Scientist, research (life sciences) estate for [employer]   Leisure active lifestyle, goes to the gym 3 days/week. (no pool available at current gym)     Observation/Other Assessments   Focus on Therapeutic Outcomes (FOTO)  63%   Other Surveys  Other Surveys   Activities of Balance Confidence Scale (ABC Scale)  74.4%     Sensation   Light Touch Appears Intact     ROM / Strength   AROM / PROM / Strength AROM;Strength     Strength   Overall Strength Comments 5/5 except for R ankle inversion/eversion 3/5, hip flexion R 4/5, R shoulder flexion 4/5,     Ambulation/Gait   Ambulation/Gait Yes   Ambulation Distance (Feet) 300 Feet   Assistive device None   Gait Pattern --  antalgic R LE pattern   Ambulation Surface Level   Gait velocity 3.49 ft/sec      Standardized Balance Assessment   Standardized Balance Assessment Berg Balance Test;Dynamic Gait Index     Berg Balance Test   Sit to Stand Able to stand without using hands and stabilize independently   Standing Unsupported Able to stand safely 2 minutes   Sitting with Back Unsupported but Feet Supported on Floor or Stool Able to sit safely and securely 2 minutes   Stand to Sit Sits safely with minimal use of hands   Transfers Able to transfer safely, minor use of hands   Standing Unsupported with Eyes Closed Able to stand 10 seconds with supervision   Standing Ubsupported with Feet Together Able to place feet together independently and stand 1 minute safely  eyes closed, feet together= significant sway   From Standing, Reach Forward with Outstretched Arm Can reach confidently >25 cm (10")   From Standing Position, Pick up Object from Floor Able to pick up shoe safely and easily   From Standing Position, Turn to Look Behind Over each Shoulder Looks behind from both sides and weight shifts well   Turn 360 Degrees Able to turn 360 degrees safely in 4 seconds or less   Standing Unsupported, Alternately Place  Feet on Step/Stool Able to stand independently and safely and complete 8 steps in 20 seconds   Standing Unsupported, One Foot in Front Able to plae foot ahead of the other independently and hold 30 seconds   Standing on One Leg Tries to lift leg/unable to hold 3 seconds but remains standing independently   Total Score 51   Berg comment: 51/56 with deficits noted with single limb,  narrow base and eyes closed standing.      Dynamic Gait Index   Level Surface Mild Impairment   Change in Gait Speed Mild Impairment   Gait with Horizontal Head Turns Normal   Gait with Vertical Head Turns Normal   Gait and Pivot Turn Normal   Step Over Obstacle Mild Impairment   Step Around Obstacles Normal   Steps Normal   Total Score 21   DGI comment: 21/24 with mild deficits with gait and changing speeds noted.      NEUROMUSCULAR RE-EDUCATION: Initial balance HEP established- see patient education         PT Education - 02-16-16 1014    Education provided Yes   Education Details HEP: ankle strengthening red band in all planes, standing partial heel toe +  head motion; standing feet together + eyes closed   Person(s) Educated Patient   Methods Explanation;Demonstration;Handout   Comprehension Returned demonstration;Verbalized understanding             PT Long Term Goals - 2016/02/16 1015      PT LONG TERM GOAL #1   Title The patient will be indep with HEP for R ankle strengthening/stability, balance, and general mobility.   Baseline Target date 03/13/2016   Time 4   Period Weeks     PT LONG TERM GOAL #2   Title The patient will improve ankle eversion/inversion strength R LE from 3/5 to 4/5 to demo improving stability for functional mobility.   Baseline Target date 03/13/2016   Time 4   Period Weeks     PT LONG TERM GOAL #3   Title The patient wil improve activities of balance confidence from 74.4% up to 85% to demo improving self perception of mobility.   Baseline Target date  03/13/2016   Time 4   Period Weeks     PT LONG TERM GOAL #4   Title The patient will verbalize understanding of community wellness for post d/c.   Baseline Target date 03/13/2016   Time 4   Period Weeks     PT LONG TERM GOAL #5   Title The patient will maintain single leg stance x 5 seconds bilaterally for improved balance control.   Baseline Target date 03/13/2016   Time 4   Period Weeks               Plan - 02/13/16 1232    Clinical Impression Statement The patient is a 70 year old male with h/o R ankle pain/weakness that he feels is limiting his functional mobility and creating a decline in balance confidence.  PT to provide patient education, strengthening to optimize current functional status.   Rehab Potential Good   PT Frequency 1x / week   PT Duration 4 weeks   PT Treatment/Interventions ADLs/Self Care Home Management;Therapeutic activities;Therapeutic exercise;Neuromuscular re-education;Balance training;Gait training;Patient/family education;Functional mobility training   PT Next Visit Plan Check HEP; progress ankle activities to tolerance.  Discuss appropriate gym machines.  Balance HEP progression.   Consulted and Agree with Plan of Care Patient      Patient will benefit from skilled therapeutic intervention in order to improve the following deficits and impairments:  Abnormal gait, Decreased balance, Decreased strength, Difficulty walking  Visit Diagnosis: Other abnormalities of gait and mobility  Muscle weakness (generalized)      G-Codes - 02/16/2016 1241    Functional Assessment Tool Used Berg=51/56   Functional Limitation Mobility: Walking and moving around   Mobility: Walking and Moving Around Current  Status [phone]) At least 1 percent but less than 20 percent impaired, limited or restricted   Mobility: Walking and Moving Around Goal Status [phone]) At least 1 percent but less than 20 percent impaired, limited or restricted       Problem  List Patient Active Problem List   Diagnosis Date Noted  . Dilated aortic root ([place]) 10/13/2014  . Chronic atrial fibrillation ([place])   . Type 2 diabetes mellitus with other circulatory complications [id number]  . HLD (hyperlipidemia) 06/14/2014  . Polycythemia 03/11/2014  . Cerebral infarction due to unspecified mechanism 03/11/2014  . Chronic anticoagulation 03/11/2014  . CVA (cerebral infarction) 12/29/2013  . Encounter for therapeutic drug monitoring 06/25/2013  . Essential hypertension, benign 04/05/2013  . Coronary artery calcification seen on CAT scan 03/25/2009    Lular Letson, PT 02/13/2016, 12:42 PM  [facility] [address] [place], [place], [address] Phone: [phone]   Fax:  [phone]  Name: JESUS LAPENTA MRN: [id number] Date of Birth: [birth year]

## 2016-02-19 ENCOUNTER — Ambulatory Visit (INDEPENDENT_AMBULATORY_CARE_PROVIDER_SITE_OTHER): Payer: Medicare Other | Admitting: *Deleted

## 2016-02-19 DIAGNOSIS — I482 Chronic atrial fibrillation, unspecified: Secondary | ICD-10-CM

## 2016-02-19 DIAGNOSIS — I4891 Unspecified atrial fibrillation: Secondary | ICD-10-CM | POA: Diagnosis not present

## 2016-02-19 DIAGNOSIS — Z5181 Encounter for therapeutic drug level monitoring: Secondary | ICD-10-CM | POA: Diagnosis not present

## 2016-02-19 LAB — POCT INR: INR: 2.9

## 2016-02-29 ENCOUNTER — Other Ambulatory Visit: Payer: Self-pay | Admitting: Cardiology

## 2016-03-01 ENCOUNTER — Ambulatory Visit: Payer: Medicare Other | Attending: Family Medicine | Admitting: Rehabilitative and Restorative Service Providers"

## 2016-03-01 DIAGNOSIS — M6281 Muscle weakness (generalized): Secondary | ICD-10-CM

## 2016-03-01 DIAGNOSIS — R2689 Other abnormalities of gait and mobility: Secondary | ICD-10-CM

## 2016-03-01 NOTE — Therapy (Signed)
[facility] [address] [place], [place], [address] Phone: [phone]   Fax:  [phone]  Physical Therapy Treatment  Patient Details  Name: Norman [name] MRN: [id number] Date of Birth: 12-18-1945 Referring Provider: Antony Contras, MD  Encounter Date: 03/01/2016      PT End of Session - 03/01/16 0850    Visit Number 2   Number of Visits 4   Date for PT Re-Evaluation 03/13/16   Authorization Type G code every 10th visit   PT Start Time 0847   PT Stop Time 0930   PT Time Calculation (min) 43 min   Activity Tolerance Patient tolerated treatment well   Behavior During Therapy [facility] for tasks assessed/performed      Past Medical History:  Diagnosis Date  . Arthritis of right ankle   . Blood in urine 2 1/2 WEEKS AGO  . BPH (benign prostatic hypertrophy)    Mild  . Chronic atrial fibrillation ([place])   . Cold 07-14-15   RESOLVED NOW  . Coronary artery calcification seen on CAT scan 2011   no ischemia on nuclear stress test 2011  . Dilated aortic root ([place])    58mm by echo 11/2014  . Epididymal cyst 2005   Bilateral and small bilateral hydroceles with most recent ultra sound  . Glaucoma   . Hypercholesteremia   . Hypertension   . Kidney stones    "I've passed them all"  . Migraine    "got off caffeine; they went away"  . Tendinitis    History in Rotator cuff  . TIA (transient ischemic attack) ?12/28/2013  . Type II diabetes mellitus ([place])     Past Surgical History:  Procedure Laterality Date  . CARDIOVERSION  X 1  . CYSTOSCOPY WITH RETROGRADE PYELOGRAM, URETEROSCOPY AND STENT PLACEMENT Right 08/18/2015   Procedure: CYSTOSCOPY WITH RIGHT  RETROGRADE PYELOGRAM, RIGHT URETEROGRAM.;  Surgeon: Kathie Rhodes, MD;  Location: [facility];  Service: Urology;  Laterality: Right;  . TONSILLECTOMY      There were no vitals filed for this visit.      Subjective Assessment - 03/01/16 0849    Subjective The patient is doing  exercises.  He notes the balance exercises make him have to correct, however it does not create ankle pain.  "The walking is what gets me" related to ankle pain.   He feels like he may consider getting ankle "rebuilt".     Pertinent History HTN, diabetes (no neuropathy per patient report), CVA 12/2013, glaucoma, a-fib, R ankle arthritis   Patient Stated Goals "I'd like to find out what exercises are recommended to lessen the effect of feeling off center"   Currently in Pain? No/denies     THERAPEUTIC EXERCISE: Standing heel raises x 10 reps, toe rasies x 10 reps near support surface for intermittent UE support Standing hip strengthening hip abduction, flexion, extension with green theraband x 10 reps near support surface Sidestepping green theraband x 10 feet x 4 reps Sitting tennis ball roll and toe flexion/extension over tennis ball  NEUROMUSCULAR RE-EDUCATION: Heel/toe walking x 20 feet x 4 reps Corner balance tandem stance Single limb stance Corner stand on pillow with feet together and eyes closed          PT Education - 03/01/16 0932    Education provided Yes   Education Details HEP:  continued seated ankle with red band, added*  feet together + eyes closed on pillow, heel/toe standing eyes open, single leg stance, theraband  strength with green band hips in all planes   Person(s) Educated Patient   Methods Explanation;Demonstration;Handout   Comprehension Verbalized understanding;Returned demonstration             PT Long Term Goals - 02/12/16 1015      PT LONG TERM GOAL #1   Title The patient will be indep with HEP for R ankle strengthening/stability, balance, and general mobility.   Baseline Target date 03/13/2016   Time 4   Period Weeks     PT LONG TERM GOAL #2   Title The patient will improve ankle eversion/inversion strength R LE from 3/5 to 4/5 to demo improving stability for functional mobility.   Baseline Target date 03/13/2016   Time 4   Period Weeks      PT LONG TERM GOAL #3   Title The patient wil improve activities of balance confidence from 74.4% up to 85% to demo improving self perception of mobility.   Baseline Target date 03/13/2016   Time 4   Period Weeks     PT LONG TERM GOAL #4   Title The patient will verbalize understanding of community wellness for post d/c.   Baseline Target date 03/13/2016   Time 4   Period Weeks     PT LONG TERM GOAL #5   Title The patient will maintain single leg stance x 5 seconds bilaterally for improved balance control.   Baseline Target date 03/13/2016   Time 4   Period Weeks               Plan - 03/01/16 0944    Clinical Impression Statement The patient's HEP progressing with ankle strengthening, balance and general hip stability to improve balance.   PT Treatment/Interventions ADLs/Self Care Home Management;Therapeutic activities;Therapeutic exercise;Neuromuscular re-education;Balance training;Gait training;Patient/family education;Functional mobility training   PT Next Visit Plan Check HEP; progress ankle activities to tolerance.  Discuss appropriate gym machines.  Balance HEP progression.   Consulted and Agree with Plan of Care Patient      Patient will benefit from skilled therapeutic intervention in order to improve the following deficits and impairments:  Abnormal gait, Decreased balance, Decreased strength, Difficulty walking  Visit Diagnosis: Other abnormalities of gait and mobility  Muscle weakness (generalized)     Problem List Patient Active Problem List   Diagnosis Date Noted  . Dilated aortic root ([place]) 10/13/2014  . Chronic atrial fibrillation ([place])   . Type 2 diabetes mellitus with other circulatory complications [id number]  . HLD (hyperlipidemia) 06/14/2014  . Polycythemia 03/11/2014  . Cerebral infarction due to unspecified mechanism 03/11/2014  . Chronic anticoagulation 03/11/2014  . CVA (cerebral infarction) 12/29/2013  . Encounter for therapeutic drug  monitoring 06/25/2013  . Essential hypertension, benign 04/05/2013  . Coronary artery calcification seen on CAT scan 03/25/2009    Vernestine Brodhead, PT 03/01/2016, 9:47 AM  [facility] [address] [place], [place], [address] Phone: [phone]   Fax:  [phone]  Name: Norman [name] MRN: [id number] Date of Birth: 11/19/1945

## 2016-03-01 NOTE — Patient Instructions (Addendum)
Resisted - Four Way Hip     With theraband around right ankle, balance on left leg and complete leg kicks in the following directions:  1. Facing toward theraband, extend right leg behind you with knee straight. Avoid bending forward at your hips. Repeat 10-15 times. 2. Turn to right, slowly kick leg out to the side while keeping your toes facing forward. Avoid leaning to the side. Repeat 10-15 times. 3. Turn to face away from theraband, kick leg straight forward keeping knee straight. Repeat 10-15 times.  Then repeat the 4 positions with the band around the other ankle.  Heel Raise: Bilateral (Standing)    Rise on balls of feet. Repeat 20 times per set.  Do 2  sessions per day.  http://orth.exer.us/38   Copyright  VHI. All rights reserved.  Toe Raise (Standing)    Rock back on heels. Repeat __20__ times per set.  Do __2_ sessions per day.  http://orth.exer.us/42   Copyright  VHI. All rights reserved.   SINGLE LIMB STANCE    Stance: single leg on floor. Raise leg. Hold _5-10__ seconds. Repeat with other leg. __3_ reps per set, __2_ sets per day.  Copyright  VHI. All rights reserved.   IF YOU ARE SORE, TAKE A DAY OFF OF EXERCISE CAN INCREASE TO 2 SETS/DAY AS LONG AS NO PAIN/SWELLING OF ANKLE.  ANKLE: Eversion, Unilateral (Band)    Place band around left foot. Keeping heel in place, raise toes of banded foot up and away from body. Do not move hip. Hold _3-5__ seconds. Use ____red____ band. _10__ reps per set, _1__ sets per day, _5__ days per week   Copyright  VHI. All rights reserved.   ANKLE: Inversion, Unilateral (Band)    Placing band around left foot. Keeping heel in place, lift toes of banded foot up and in. Do not move hip. Hold _3-5__ seconds. Use ___RED_____ band. _10__ reps per set, _1__ sets per day, _5__ days per week  Copyright  VHI. All rights reserved.   ANKLE: Pump With Resistance Band    With exercise band around arch of  involved foot, bend foot and toes toward floor against the band's resistance. Repeat _10__ times. Do _1__ set per day.  Copyright  VHI. All rights reserved.   ANKLE: Dorsiflexion (Band)    Sit at edge of surface. Place band around top of foot. Keeping heel on floor, raise toes of banded foot. Hold __3-5_ seconds. Use ____red____ band. _10__ reps per set, _1__ sets per day, __5_ days per week  Copyright  VHI. All rights reserved.   Feet Together, Varied Arm Positions - Eyes Closed    Stand with feet together and arms out. Close eyes and visualize upright position. Hold __30__ seconds. Repeat __3__ times per session. Do __2__ sessions per day.  Copyright  VHI. All rights reserved.    Feet Heel-Toe "Tandem", Varied Arm Positions - Eyes Open    With eyes open, right foot directly in front of the other, arms at your side, look straight ahead at a stationary object. Hold __30__ seconds. Repeat __3__ times per session. Do __2__ sessions per day.  Copyright  VHI. All rights reserved.

## 2016-03-04 ENCOUNTER — Ambulatory Visit: Payer: Medicare Other | Admitting: Rehabilitative and Restorative Service Providers"

## 2016-03-11 ENCOUNTER — Ambulatory Visit: Payer: Medicare Other | Admitting: Rehabilitative and Restorative Service Providers"

## 2016-03-27 ENCOUNTER — Ambulatory Visit: Payer: Medicare Other | Attending: Family Medicine | Admitting: Rehabilitative and Restorative Service Providers"

## 2016-03-27 DIAGNOSIS — R2689 Other abnormalities of gait and mobility: Secondary | ICD-10-CM | POA: Insufficient documentation

## 2016-03-27 DIAGNOSIS — M6281 Muscle weakness (generalized): Secondary | ICD-10-CM | POA: Diagnosis present

## 2016-03-27 NOTE — Therapy (Signed)
[facility] [address] [place], [place], [address] Phone: [phone]   Fax:  [phone]  Physical Therapy Treatment and Discharge Note  Patient Details  Name: Norman [name] MRN: [id number] Date of Birth: Jan 12, 1946 Referring Provider: Antony Contras, MD  Encounter Date: 03/27/2016      PT End of Session - 03/27/16 1333    Visit Number 3   Number of Visits 4   Date for PT Re-Evaluation 03/13/16   Authorization Type G code every 10th visit   PT Start Time 1325   PT Stop Time 1350   PT Time Calculation (min) 25 min   Activity Tolerance Patient tolerated treatment well   Behavior During Therapy [facility] for tasks assessed/performed      Past Medical History:  Diagnosis Date  . Arthritis of right ankle   . Blood in urine 2 1/2 WEEKS AGO  . BPH (benign prostatic hypertrophy)    Mild  . Chronic atrial fibrillation ([place])   . Cold 07-14-15   RESOLVED NOW  . Coronary artery calcification seen on CAT scan 2011   no ischemia on nuclear stress test 2011  . Dilated aortic root ([place])    57m by echo 11/2014  . Epididymal cyst 2005   Bilateral and small bilateral hydroceles with most recent ultra sound  . Glaucoma   . Hypercholesteremia   . Hypertension   . Kidney stones    "I've passed them all"  . Migraine    "got off caffeine; they went away"  . Tendinitis    History in Rotator cuff  . TIA (transient ischemic attack) ?12/28/2013  . Type II diabetes mellitus ([place]     Past Surgical History:  Procedure Laterality Date  . CARDIOVERSION  X 1  . CYSTOSCOPY WITH RETROGRADE PYELOGRAM, URETEROSCOPY AND STENT PLACEMENT Right 08/18/2015   Procedure: CYSTOSCOPY WITH RIGHT  RETROGRADE PYELOGRAM, RIGHT URETEROGRAM.;  Surgeon: MKathie Rhodes MD;  Location: [facility];  Service: Urology;  Laterality: Right;  . TONSILLECTOMY      There were no vitals filed for this visit.      Subjective Assessment - 03/27/16 1326    Subjective  The patient notes that he doesn't see any change with the exercises.  Standing on one leg is impossible to do without UE support.   He also notes that with repetition of home exercises his ankle is more aggravated.  The patient feels exercises create a constant correction the whole time.    The patient feels that his ankle is the biggest l imiting factor.     Pertinent History HTN, diabetes (no neuropathy per patient report), CVA 12/2013, glaucoma, a-fib, R ankle arthritis   Patient Stated Goals "I'd like to find out what exercises are recommended to lessen the effect of feeling off center"   Currently in Pain? No/denies      Gait: MMT ankle 5/5 eversion/inversion. Gait 13M=3.69 ft/sec without device Ambulation with antalgic pattern developing after 150 ft (right side due to ankle)  NEUROMUSCULAR RE-EDUCATION: Reviewed tandem stance in corner and single leg stance (performs 4 seconds on L and 3 seconds on R), discussed all balance HEP and recommended continue HEP after d/c to continue to work on [employer]  SELF CARE/HOME MANAGEMENT: Discussed community wellness routine post d/c and patient/PT reviewed gym activities that he participates in.      [facility] - 03/27/16 1329      Self-Care   Self-Care Other Self-Care Comments  Other Self-Care Comments  The patient performs UE weight lifting at gym, hip ab/adduction, long arc quad, recumbent bike.            PT Long Term Goals - 04-15-2016 1334      PT LONG TERM GOAL #1   Title The patient will be indep with HEP for R ankle strengthening/stability, balance, and general mobility.   Baseline met on 15-Apr-2016   Time 4   Period Weeks   Status Achieved     PT LONG TERM GOAL #2   Title The patient will improve ankle eversion/inversion strength R LE from 3/5 to 4/5 to demo improving stability for functional mobility.   Baseline met on 2016/04/15   Time 4   Period Weeks   Status Achieved      PT LONG TERM GOAL #3   Title The patient wil improve activities of balance confidence from 74.4% up to 85% to demo improving self perception of mobility.   Baseline Improved to 88.1% on April 15, 2016,   Time 4   Period Weeks   Status Achieved     PT LONG TERM GOAL #4   Title The patient will verbalize understanding of community wellness for post d/c.   Baseline Plans to go to the gym on 2016/04/15.    Time 4   Period Weeks   Status Achieved     PT LONG TERM GOAL #5   Title The patient will maintain single leg stance x 5 seconds bilaterally for improved balance control.   Baseline 4 seconds on the left side and 3 on the right on Apr 15, 2016.    Time 4   Period Weeks   Status Partially Met               Plan - Apr 15, 2016 1355    Clinical Impression Statement The patient has been working on HEP and does not feel significant improvement in his overall balance.  PT recommended he continue to work on [employer] and return to [employer] program for gym routine.  He feels most limited by ankle stability and h/o ankle pain--he plans to f/u with MD regarding ankle interventions.    PT Treatment/Interventions ADLs/Self Care Home Management;Therapeutic activities;Therapeutic exercise;Neuromuscular re-education;Balance training;Gait training;Patient/family education;Functional mobility training   PT Next Visit Plan Discharge today.   Consulted and Agree with Plan of Care Patient      Patient will benefit from skilled therapeutic intervention in order to improve the following deficits and impairments:  Abnormal gait, Decreased balance, Decreased strength, Difficulty walking  Visit Diagnosis: Other abnormalities of gait and mobility  Muscle weakness (generalized)       G-Codes - April 15, 2016 1401    Functional Assessment Tool Used Berg=51/56   Functional Limitation Mobility: Walking and moving around   Mobility: Walking and Moving Around Goal Status [phone]) At least 1 percent but less than 20  percent impaired, limited or restricted   Mobility: Walking and Moving Around Discharge Status [phone]) At least 1 percent but less than 20 percent impaired, limited or restricted     PHYSICAL THERAPY DISCHARGE SUMMARY  Visits from Start of Care: 3  Current functional level related to goals / functional outcomes: See goals above   Remaining deficits: Decreased high level balance Decreased extended ambulation tolerance Chronic R ankle deficits   Education / Equipment: HEP, gym routine.  Plan: Patient agrees to discharge.  Patient goals were partially met. Patient is being discharged due to meeting the stated rehab goals.  ?????  Thank you for the referral of this patient. Rudell Cobb, MPT  Kindal Ponti 03/27/2016, 2:41 PM  [facility] [address] [place], [place], [address] Phone: [phone]   Fax:  [phone]  Name: Norman [name] MRN: [id number] Date of Birth: 1945/04/15

## 2016-03-29 ENCOUNTER — Ambulatory Visit (INDEPENDENT_AMBULATORY_CARE_PROVIDER_SITE_OTHER): Payer: Medicare Other | Admitting: *Deleted

## 2016-03-29 DIAGNOSIS — I482 Chronic atrial fibrillation, unspecified: Secondary | ICD-10-CM

## 2016-03-29 DIAGNOSIS — Z5181 Encounter for therapeutic drug level monitoring: Secondary | ICD-10-CM

## 2016-03-29 DIAGNOSIS — I4891 Unspecified atrial fibrillation: Secondary | ICD-10-CM | POA: Diagnosis not present

## 2016-03-29 LAB — POCT INR: INR: 2.6

## 2016-03-30 LAB — CBC
Hematocrit: 51.3 % — ABNORMAL HIGH (ref 37.5–51.0)
Hemoglobin: 17.3 g/dL (ref 13.0–17.7)
MCH: 31.2 pg (ref 26.6–33.0)
MCHC: 33.7 g/dL (ref 31.5–35.7)
MCV: 92 fL (ref 79–97)
PLATELETS: 243 10*3/uL (ref 150–379)
RBC: 5.55 x10E6/uL (ref 4.14–5.80)
RDW: 13.8 % (ref 12.3–15.4)
WBC: 7.7 10*3/uL (ref 3.4–10.8)

## 2016-03-30 LAB — BASIC METABOLIC PANEL
BUN / CREAT RATIO: 18 (ref 10–24)
BUN: 21 mg/dL (ref 8–27)
CHLORIDE: 99 mmol/L (ref 96–106)
CO2: 25 mmol/L (ref 18–29)
Calcium: 9.3 mg/dL (ref 8.6–10.2)
Creatinine, Ser: 1.14 mg/dL (ref 0.76–1.27)
GFR calc Af Amer: 74 mL/min/{1.73_m2} (ref >59)
GFR calc non Af Amer: 64 mL/min/{1.73_m2} (ref >59)
Glucose: 122 mg/dL — ABNORMAL HIGH (ref 65–99)
POTASSIUM: 4.2 mmol/L (ref 3.5–5.2)
SODIUM: 139 mmol/L (ref 134–144)

## 2016-04-01 ENCOUNTER — Telehealth: Payer: Self-pay | Admitting: *Deleted

## 2016-04-01 ENCOUNTER — Ambulatory Visit: Payer: Medicare Other | Admitting: Rehabilitative and Restorative Service Providers"

## 2016-04-01 NOTE — Telephone Encounter (Signed)
Pt notified of lab results. See phone note from earlier today from Elbert Ewings, RN about pt starting Eliquis.

## 2016-04-01 NOTE — Telephone Encounter (Signed)
Spoke with pt and he states he will wait and start Eliquis when he comes for appt  January 26th and would like for Eliquis to be called in to [facility] on [address] when he starts medication  and he states understanding

## 2016-04-01 NOTE — Telephone Encounter (Signed)
-----   Message from Sueanne Margarita, MD sent at 03/29/2016 10:35 PM EST ----- I am fine with him changing to ELiquis  Fransico Him, MD ----- Message ----- From: Margretta Sidle, RN Sent: 03/29/2016   8:19 AM To: Sueanne Margarita, MD  Dr Radford Pax  Mr Haverty came to coumadin clinic today and states he wants to change to Eliquis INR today was 2.6 needs to be less than 2.0 to change Please advise if this is ok for him to switch and will call pt and make appt for this change  Thank You  Elbert Ewings RN

## 2016-04-19 ENCOUNTER — Ambulatory Visit (INDEPENDENT_AMBULATORY_CARE_PROVIDER_SITE_OTHER): Payer: Medicare Other | Admitting: *Deleted

## 2016-04-19 DIAGNOSIS — I4891 Unspecified atrial fibrillation: Secondary | ICD-10-CM

## 2016-04-19 DIAGNOSIS — Z5181 Encounter for therapeutic drug level monitoring: Secondary | ICD-10-CM

## 2016-04-19 DIAGNOSIS — I482 Chronic atrial fibrillation, unspecified: Secondary | ICD-10-CM

## 2016-04-19 LAB — POCT INR: INR: 3

## 2016-04-19 MED ORDER — APIXABAN 5 MG PO TABS: 5.0000 mg | ORAL_TABLET | Freq: Two times a day (BID) | ORAL | 1 refills | Status: DC

## 2016-05-17 ENCOUNTER — Ambulatory Visit (INDEPENDENT_AMBULATORY_CARE_PROVIDER_SITE_OTHER): Payer: Medicare Other | Admitting: *Deleted

## 2016-05-17 DIAGNOSIS — I4891 Unspecified atrial fibrillation: Secondary | ICD-10-CM

## 2016-05-17 LAB — CBC
HEMOGLOBIN: 16.9 g/dL (ref 13.0–17.7)
Hematocrit: 50.9 % (ref 37.5–51.0)
MCH: 31.5 pg (ref 26.6–33.0)
MCHC: 33.2 g/dL (ref 31.5–35.7)
MCV: 95 fL (ref 79–97)
Platelets: 236 10*3/uL (ref 150–379)
RBC: 5.37 x10E6/uL (ref 4.14–5.80)
RDW: 13.2 % (ref 12.3–15.4)
WBC: 7.5 10*3/uL (ref 3.4–10.8)

## 2016-05-17 LAB — BASIC METABOLIC PANEL
BUN / CREAT RATIO: 15 (ref 10–24)
BUN: 19 mg/dL (ref 8–27)
CO2: 26 mmol/L (ref 18–29)
CREATININE: 1.24 mg/dL (ref 0.76–1.27)
Calcium: 9.3 mg/dL (ref 8.6–10.2)
Chloride: 98 mmol/L (ref 96–106)
GFR, EST AFRICAN AMERICAN: 67 mL/min/{1.73_m2} (ref >59)
GFR, EST NON AFRICAN AMERICAN: 58 mL/min/{1.73_m2} — AB (ref >59)
Glucose: 204 mg/dL — ABNORMAL HIGH (ref 65–99)
Potassium: 4.5 mmol/L (ref 3.5–5.2)
Sodium: 140 mmol/L (ref 134–144)

## 2016-05-17 NOTE — Progress Notes (Signed)
Pt was started on Eliquis 5mg  twice a day for AFIB on 04/19/16 by Dr. Fransico Him.    Reviewed patients medication list.  Pt is not currently on any combined P-gp and strong CYP3A4 inhibitors/inducers (ketoconazole, traconazole, ritonavir, carbamazepine, phenytoin, rifampin, St. John's wort).  Reviewed labs: SCr-1.24, Hgb-16.9, HCT-50.9, Weight-107.4 kg.  Dose appropriate based on age, weight, and SCr.  Hgb and HCT Within Normal Limits.  A full discussion of the nature of anticoagulants has been carried out.  A benefit/risk analysis has been presented to the patient, so that they understand the justification for choosing anticoagulation with Eliquis at this time.  The need for compliance is stressed.  Pt is aware to take the medication twice daily.  Side effects of potential bleeding are discussed, including unusual colored urine or stools, coughing up blood or coffee ground emesis, nose bleeds or serious fall or head trauma.  Discussed signs and symptoms of stroke. The patient should avoid any OTC items containing aspirin or ibuprofen.  Avoid alcohol consumption.   Call if any signs of abnormal bleeding.  Discussed financial obligations and resolved any difficulty in obtaining medication.  Next lab test in 6 months when pt sets his Dr. Radford Pax appt.   Pt taken to the lab to have CBC & BMET drawn for a 1 month follow-up. Labs reviewed and called pt to discuss results but had to leave a msg for the pt to call back.   05/20/16-Pt returned call back to [facility] & discussed lab results with pt.  Pt advised to continue taking Eliquis 5mg  twice a day & he verbalized understanding.  Pt will have labs repeated in 6 months when he is scheduled for his Dr. Radford Pax Appt.

## 2016-05-20 ENCOUNTER — Telehealth: Payer: Self-pay | Admitting: Cardiology

## 2016-05-20 NOTE — Telephone Encounter (Signed)
Informed patient of results and verbal understanding expressed.   Forwarded to PCP.

## 2016-05-20 NOTE — Telephone Encounter (Signed)
-----   Message from Sueanne Margarita, MD sent at 05/19/2016  7:32 PM EST ----- Stable labs - continue current meds - BS elevated - please forward to PCP

## 2016-05-20 NOTE — Telephone Encounter (Signed)
New Message     He is returning your call

## 2016-10-10 ENCOUNTER — Other Ambulatory Visit: Payer: Self-pay | Admitting: Cardiology

## 2016-10-10 NOTE — Telephone Encounter (Signed)
Pt last saw Dr Radford Pax 10/13/15, has yearly follow-up appt scheduled for 10/16/16 with Dr Radford Pax.  Last labs 05/17/16 Creat 1.24, weight 105.2kg, age 71, based on specified criteria pt is on appropriate dosage of Eliquis 5mg  BID.  Will refill rx.

## 2016-10-16 ENCOUNTER — Ambulatory Visit (INDEPENDENT_AMBULATORY_CARE_PROVIDER_SITE_OTHER): Payer: Medicare Other | Admitting: Cardiology

## 2016-10-16 ENCOUNTER — Encounter (INDEPENDENT_AMBULATORY_CARE_PROVIDER_SITE_OTHER): Payer: Self-pay

## 2016-10-16 ENCOUNTER — Encounter: Payer: Self-pay | Admitting: Cardiology

## 2016-10-16 VITALS — BP 140/88 | HR 71 | Ht 72.0 in | Wt 228.4 lb

## 2016-10-16 DIAGNOSIS — I77810 Thoracic aortic ectasia: Secondary | ICD-10-CM | POA: Diagnosis not present

## 2016-10-16 DIAGNOSIS — I2510 Atherosclerotic heart disease of native coronary artery without angina pectoris: Secondary | ICD-10-CM

## 2016-10-16 DIAGNOSIS — I4821 Permanent atrial fibrillation: Secondary | ICD-10-CM

## 2016-10-16 DIAGNOSIS — I482 Chronic atrial fibrillation: Secondary | ICD-10-CM

## 2016-10-16 DIAGNOSIS — I10 Essential (primary) hypertension: Secondary | ICD-10-CM | POA: Diagnosis not present

## 2016-10-16 DIAGNOSIS — E7800 Pure hypercholesterolemia, unspecified: Secondary | ICD-10-CM

## 2016-10-16 LAB — CBC WITH DIFFERENTIAL/PLATELET
BASOS ABS: 0 10*3/uL (ref 0.0–0.2)
BASOS: 0 %
EOS (ABSOLUTE): 0.1 10*3/uL (ref 0.0–0.4)
Eos: 1 %
Hematocrit: 48.8 % (ref 37.5–51.0)
Hemoglobin: 17.3 g/dL (ref 13.0–17.7)
Immature Grans (Abs): 0 10*3/uL (ref 0.0–0.1)
Immature Granulocytes: 0 %
Lymphocytes Absolute: 2 10*3/uL (ref 0.7–3.1)
Lymphs: 32 %
MCH: 32.5 pg (ref 26.6–33.0)
MCHC: 35.5 g/dL (ref 31.5–35.7)
MCV: 92 fL (ref 79–97)
MONOS ABS: 0.4 10*3/uL (ref 0.1–0.9)
Monocytes: 6 %
NEUTROS PCT: 61 %
Neutrophils Absolute: 3.9 10*3/uL (ref 1.4–7.0)
PLATELETS: 239 10*3/uL (ref 150–379)
RBC: 5.32 x10E6/uL (ref 4.14–5.80)
RDW: 13.2 % (ref 12.3–15.4)
WBC: 6.4 10*3/uL (ref 3.4–10.8)

## 2016-10-16 LAB — BASIC METABOLIC PANEL
BUN/Creatinine Ratio: 13 (ref 10–24)
BUN: 17 mg/dL (ref 8–27)
CHLORIDE: 98 mmol/L (ref 96–106)
CO2: 24 mmol/L (ref 20–29)
Calcium: 9.2 mg/dL (ref 8.6–10.2)
Creatinine, Ser: 1.27 mg/dL (ref 0.76–1.27)
GFR calc Af Amer: 64 mL/min/{1.73_m2} (ref >59)
GFR, EST NON AFRICAN AMERICAN: 56 mL/min/{1.73_m2} — AB (ref >59)
GLUCOSE: 179 mg/dL — AB (ref 65–99)
POTASSIUM: 4.4 mmol/L (ref 3.5–5.2)
SODIUM: 139 mmol/L (ref 134–144)

## 2016-10-16 MED ORDER — APIXABAN 5 MG PO TABS: 5.0000 mg | ORAL_TABLET | Freq: Two times a day (BID) | ORAL | 3 refills | Status: DC

## 2016-10-16 NOTE — Patient Instructions (Signed)

## 2016-10-16 NOTE — Progress Notes (Signed)
Cardiology Office Note:    Date:  10/16/2016   ID:  Norman [name], DOB 05-04-1945, MRN [id number]  PCP:  Antony Contras, MD  Cardiologist:  Fransico Him, MD   Referring MD: Antony Contras, MD   Chief Complaint  Patient presents with  . Atrial Fibrillation  . Hypertension  . Hyperlipidemia    History of Present Illness:    Norman [name] is a 71 y.o. male with a hx of with a history of permanent atrial fibrillation, carotid artery stenosis, CVA followed by Neuro, hyperlipidemia and dilated aortic root and HTN.  He is here today for followup and is doing well.   He denies any chest pain of pressure, LE edema, dizziness (unless getting up too quick), palpitations or syncope.  He does have some DOE if he is going up steps or working hard but it does not limit his activity.  He rides a bike without problems.    Past Medical History:  Diagnosis Date  . Arthritis of right ankle   . Blood in urine 2 1/2 WEEKS AGO  . BPH (benign prostatic hypertrophy)    Mild  . Chronic atrial fibrillation ([place])   . Cold 07-14-15   RESOLVED NOW  . Coronary artery calcification seen on CAT scan 2011   no ischemia on nuclear stress test 2011  . Dilated aortic root ([place])    43mm by echo 11/2014  . Epididymal cyst 2005   Bilateral and small bilateral hydroceles with most recent ultra sound  . Glaucoma   . Hypercholesteremia   . Hypertension   . Kidney stones    "I've passed them all"  . Migraine    "got off caffeine; they went away"  . Tendinitis    History in Rotator cuff  . TIA (transient ischemic attack) ?12/28/2013  . Type II diabetes mellitus ([place])     Past Surgical History:  Procedure Laterality Date  . CARDIOVERSION  X 1  . CYSTOSCOPY WITH RETROGRADE PYELOGRAM, URETEROSCOPY AND STENT PLACEMENT Right 08/18/2015   Procedure: CYSTOSCOPY WITH RIGHT  RETROGRADE PYELOGRAM, RIGHT URETEROGRAM.;  Surgeon: Kathie Rhodes, MD;  [facility]: [facility];  Service: Urology;  Laterality: Right;  . TONSILLECTOMY       Current Medications: Current Meds  Medication Sig  . atorvastatin (LIPITOR) 80 MG tablet Take 80 mg by mouth every evening.   . bimatoprost (LUMIGAN) 0.03 % ophthalmic solution Place 1 drop into both eyes at bedtime.   Marland Kitchen CARTIA XT 240 MG 24 hr capsule Take 240 mg by mouth daily.  . COMBIGAN 0.2-0.5 % ophthalmic solution Place 1 drop into both eyes 2 (two) times daily.  Marland Kitchen ELIQUIS 5 MG TABS tablet TAKE 1 TABLET (5 MG TOTAL) BY MOUTH 2 (TWO) TIMES DAILY.  . metFORMIN (GLUCOPHAGE) 500 MG tablet Take 500 mg by mouth daily with breakfast.  . metoprolol tartrate (LOPRESSOR) 25 MG tablet Take 25 mg by mouth 2 (two) times daily.  Marland Kitchen zolpidem (AMBIEN CR) 12.5 MG CR tablet Take 12.5 mg by mouth at bedtime as needed for sleep.     Allergies:   Patient has no known allergies.   Social History   Social History  . Marital status: Married    Spouse name: N/A  . Number of children: 2  . Years of education: college   Social History Main Topics  . Smoking status: Never Smoker  . Smokeless tobacco: Never Used  . Alcohol use 0.6 oz/week    1 Glasses of wine per week  Comment: occ  . Drug use: No  . Sexual activity: Not Asked   Other Topics Concern  . None   Social History Narrative   Patient is married with 2 children.   Patient is right handed.   Patient has college education.   Patient drinks very little caffeine.     Family History: The patient's family history includes Diabetes in his maternal aunt; Heart attack in his father; Kidney failure in his mother.  ROS:   Please see the history of present illness.     All other systems reviewed and are negative.  EKGs/Labs/Other Studies Reviewed:    The following studies were reviewed today: none  EKG:  EKG is  ordered today.  The ekg ordered today demonstrates atrial fibrillation at 71bpm with no ST changes  Recent Labs: 05/17/2016: BUN 19; Creatinine, Ser 1.24; Hemoglobin 16.9; Platelets 236; Potassium 4.5; Sodium 140    Recent Lipid Panel    Component Value Date/Time   CHOL 113 12/29/2013 0400   TRIG 110 12/29/2013 0400   HDL 31 (L) 12/29/2013 0400   CHOLHDL 3.6 12/29/2013 0400   VLDL 22 12/29/2013 0400   LDLCALC 60 12/29/2013 0400    Physical Exam:    VS:  BP 140/88   Pulse 71   Ht 6' (1.829 m)   Wt 228 lb 6.4 oz (103.6 kg)   BMI 30.98 kg/m     Wt Readings from Last 3 Encounters:  10/16/16 228 lb 6.4 oz (103.6 kg)  10/13/15 232 lb (105.2 kg)  08/18/15 229 lb (103.9 kg)     GEN:  Well nourished, well developed in no acute distress HEENT: Normal NECK: No JVD; No carotid bruits LYMPHATICS: No lymphadenopathy CARDIAC: irregularly irregular, no murmurs, rubs, gallops RESPIRATORY:  Clear to auscultation without rales, wheezing or rhonchi  ABDOMEN: Soft, non-tender, non-distended MUSCULOSKELETAL:  No edema; No deformity  SKIN: Warm and dry NEUROLOGIC:  Alert and oriented x 3 PSYCHIATRIC:  Normal affect   ASSESSMENT:    1. Permanent atrial fibrillation ([place])   2. Essential hypertension, benign   3. Coronary artery calcification seen on CAT scan   4. Dilated aortic root ([name])   5. Pure hypercholesterolemia    PLAN:    In order of problems listed above:  1. Permanent atrial fibrillation - his heart rate is well controlled at 71bpm.  He will continue on Cartia, Metoprolol and Eliquis 5mg  BID.  Check BMET and CBC.  2. HTN - his BP is well controlled on exam.  He will continue on Lopressor 25mg  BID and Cartia XT 240mg  daily  3. Coronary artery calcifications on chest CT - he will continue on statin.  He has no anginal symptoms.   4. Dilated ascending aorta (45mm on last echo 12/2015).  Continue statin and antihypertensive Rx.  5. Hyperlipidemia - his LDL goal is < 70.  He will continue on Atorvastatin 80mg  daily. I will get a copy of FLP from PCP   Medication Adjustments/Labs and Tests Ordered: Current medicines are reviewed at length with the patient today.  Concerns regarding  medicines are outlined above.  No orders of the defined types were placed in this encounter.  No orders of the defined types were placed in this encounter.   Signed, Fransico Him, MD  10/16/2016 8:57 AM    [place]

## 2016-10-17 ENCOUNTER — Encounter (INDEPENDENT_AMBULATORY_CARE_PROVIDER_SITE_OTHER): Payer: Self-pay

## 2017-04-14 ENCOUNTER — Encounter: Payer: Self-pay | Admitting: Cardiology

## 2017-04-14 NOTE — Progress Notes (Signed)
Cardiology Office Note:    Date:  04/15/2017   ID:  Norman [name], DOB 11/22/1945, MRN [id number]  PCP:  Antony Contras, MD  Cardiologist:  No primary care provider on file.    Referring MD: Antony Contras, MD   Chief Complaint  Patient presents with  . Atrial Fibrillation  . Hypertension  . Hyperlipidemia    History of Present Illness:    Norman [name] is a 72 y.o. male with  history of permanent atrial fibrillation, carotid artery stenosis, coronary artery calcifications by CT, hyperlipidemia and dilated aortic root and HTN.  She is here today for followup and is doing well.  She denies any chest pain or pressure, SOB, DOE, PND, orthopnea, LE edema, dizziness, palpitations or syncope. He complains of pain in his legs when he walks or stands for a long period. She is compliant with her meds and is tolerating meds with no SE.  He did have a problems with epistaxis a few weeks ago which occurred several times when it was cold out.  He was set up to see ENT but did not follow through.  He has not had any bleeding since then.    Past Medical History:  Diagnosis Date  . Arthritis of right ankle   . Blood in urine 2 1/2 WEEKS AGO  . BPH (benign prostatic hypertrophy)    Mild  . Coronary artery calcification seen on CAT scan 2011   no ischemia on nuclear stress test 2011  . Dilated aortic root ([place])    90mm by echo 2017  . Epididymal cyst 2005   Bilateral and small bilateral hydroceles with most recent ultra sound  . Glaucoma   . Hypercholesteremia   . Hypertension   . Kidney stones    "I've passed them all"  . Migraine    "got off caffeine; they went away"  . Permanent atrial fibrillation ([place])   . Tendinitis    History in Rotator cuff  . TIA (transient ischemic attack) ?12/28/2013  . Type II diabetes mellitus ([place])     Past Surgical History:  Procedure Laterality Date  . CARDIOVERSION  X 1  . CYSTOSCOPY WITH RETROGRADE PYELOGRAM, URETEROSCOPY AND STENT PLACEMENT Right  08/18/2015   Procedure: CYSTOSCOPY WITH RIGHT  RETROGRADE PYELOGRAM, RIGHT URETEROGRAM.;  Surgeon: Kathie Rhodes, MD;  Location: [facility];  Service: Urology;  Laterality: Right;  . TONSILLECTOMY      Current Medications: Current Meds  Medication Sig  . apixaban (ELIQUIS) 5 MG TABS tablet Take 1 tablet (5 mg total) by mouth 2 (two) times daily.  Marland Kitchen atorvastatin (LIPITOR) 80 MG tablet Take 80 mg by mouth every evening.   . bimatoprost (LUMIGAN) 0.03 % ophthalmic solution Place 1 drop into both eyes at bedtime.   Marland Kitchen CARTIA XT 240 MG 24 hr capsule Take 240 mg by mouth daily.  . COMBIGAN 0.2-0.5 % ophthalmic solution Place 1 drop into both eyes 2 (two) times daily.  . metFORMIN (GLUCOPHAGE) 500 MG tablet Take 500 mg by mouth daily with breakfast.  . metoprolol tartrate (LOPRESSOR) 25 MG tablet Take 25 mg by mouth 2 (two) times daily.  Marland Kitchen zolpidem (AMBIEN CR) 12.5 MG CR tablet Take 12.5 mg by mouth at bedtime as needed for sleep.     Allergies:   Patient has no known allergies.   Social History   Socioeconomic History  . Marital status: Married    Spouse name: None  . Number of children: 2  . Years of  education: college  . Highest education level: None  Social Needs  . Financial resource strain: None  . Food insecurity - worry: None  . Food insecurity - inability: None  . Transportation needs - medical: None  . Transportation needs - non-medical: None  Occupational History  . None  Tobacco Use  . Smoking status: Never Smoker  . Smokeless tobacco: Never Used  Substance and Sexual Activity  . Alcohol use: Yes    Alcohol/week: 0.6 oz    Types: 1 Glasses of wine per week    Comment: occ  . Drug use: No  . Sexual activity: None  Other Topics Concern  . None  Social History Narrative   Patient is married with 2 children.   Patient is right handed.   Patient has college education.   Patient drinks very little caffeine.     Family History: The patient's family history includes  Diabetes in his maternal aunt; Heart attack in his father; Kidney failure in his mother.  ROS:   Please see the history of present illness.    ROS  All other systems reviewed and negative.   EKGs/Labs/Other Studies Reviewed:    The following studies were reviewed today: none  EKG:  EKG is not ordered today.    Recent Labs: 10/16/2016: BUN 17; Creatinine, Ser 1.27; Hemoglobin 17.3; Platelets 239; Potassium 4.4; Sodium 139   Recent Lipid Panel    Component Value Date/Time   CHOL 113 12/29/2013 0400   TRIG 110 12/29/2013 0400   HDL 31 (L) 12/29/2013 0400   CHOLHDL 3.6 12/29/2013 0400   VLDL 22 12/29/2013 0400   LDLCALC 60 12/29/2013 0400    Physical Exam:    VS:  BP 138/80   Pulse 69   Ht 5\' 11"  (1.803 m)   Wt 238 lb 9.6 oz (108.2 kg)   SpO2 98%   BMI 33.28 kg/m     Wt Readings from Last 3 Encounters:  04/15/17 238 lb 9.6 oz (108.2 kg)  10/16/16 228 lb 6.4 oz (103.6 kg)  10/13/15 232 lb (105.2 kg)     GEN:  Well nourished, well developed in no acute distress HEENT: Normal NECK: No JVD; No carotid bruits LYMPHATICS: No lymphadenopathy CARDIAC: irregularly ilrregular no murmurs, rubs, gallops RESPIRATORY:  Clear to auscultation without rales, wheezing or rhonchi  ABDOMEN: Soft, non-tender, non-distended MUSCULOSKELETAL:  No edema; No deformity  SKIN: Warm and dry NEUROLOGIC:  Alert and oriented x 3 PSYCHIATRIC:  Normal affect   ASSESSMENT:    1. Permanent atrial fibrillation ([place])   2. Essential hypertension, benign   3. Dilated aortic root ([place])   4. Coronary artery calcification seen on CAT scan   5. Pure hypercholesterolemia   6. Leg cramps    PLAN:    In order of problems listed above:  1.  Permanent atrial fibrillation - his heart rate is well controlled on Cartia XT, BB and apixaban.  He will continue on current meds. Creatinine was 1.14 and Hbg 16 on labs 01/2017.  I encouraged him to go ahead and see the ENT to make sure there is no lesion in his  nose that has now become prone to bleed from being on blood thinner.  2.  HTN- BP is well controlled on exam today.  He will continue on Lopressor 25mg  BID and Cartiz XT 240mg  daily.  3.  Dilated aortic root - essentially normal on echo 2017 at 9mm.    4.  Coronary artery calcifications on CT  -  No ischemia on nuclear stress test 2011.  He denies any angina symptoms.   5.  Hyperlipidemia with LDL goal < 70.  He will continue on atorvastatin 80mg  daily.  LDL was 62 on 01/28/2017 at PCP.  6.  Leg cramps - some of his sx sound more MSK but he has reduced pulses in his LE so I will get LE arterial dopplers to rule out PAD.   Medication Adjustments/Labs and Tests Ordered: Current medicines are reviewed at length with the patient today.  Concerns regarding medicines are outlined above.  No orders of the defined types were placed in this encounter.  No orders of the defined types were placed in this encounter.   Signed, Fransico Him, MD  04/15/2017 8:22 AM    [facility]

## 2017-04-15 ENCOUNTER — Encounter: Payer: Self-pay | Admitting: Cardiology

## 2017-04-15 ENCOUNTER — Encounter (INDEPENDENT_AMBULATORY_CARE_PROVIDER_SITE_OTHER): Payer: Self-pay

## 2017-04-15 ENCOUNTER — Ambulatory Visit: Payer: Medicare Other | Admitting: Cardiology

## 2017-04-15 VITALS — BP 138/80 | HR 69 | Ht 71.0 in | Wt 238.6 lb

## 2017-04-15 DIAGNOSIS — I482 Chronic atrial fibrillation: Secondary | ICD-10-CM

## 2017-04-15 DIAGNOSIS — R252 Cramp and spasm: Secondary | ICD-10-CM

## 2017-04-15 DIAGNOSIS — I2510 Atherosclerotic heart disease of native coronary artery without angina pectoris: Secondary | ICD-10-CM | POA: Diagnosis not present

## 2017-04-15 DIAGNOSIS — I77810 Thoracic aortic ectasia: Secondary | ICD-10-CM

## 2017-04-15 DIAGNOSIS — E7800 Pure hypercholesterolemia, unspecified: Secondary | ICD-10-CM | POA: Diagnosis not present

## 2017-04-15 DIAGNOSIS — I10 Essential (primary) hypertension: Secondary | ICD-10-CM

## 2017-04-15 DIAGNOSIS — I4821 Permanent atrial fibrillation: Secondary | ICD-10-CM

## 2017-04-15 MED ORDER — APIXABAN 5 MG PO TABS: 5.0000 mg | ORAL_TABLET | Freq: Two times a day (BID) | ORAL | 3 refills | Status: DC

## 2017-04-15 NOTE — Patient Instructions (Signed)
Medication Instructions:  Your physician recommends that you continue on your current medications as directed. Please refer to the Current Medication list given to you today.  If you need a refill on your cardiac medications, please contact your pharmacy first.  Labwork: None ordered   Testing/Procedures: Your physician has requested that you have a lower extremity arterial exercise duplex. During this test, exercise and ultrasound are used to evaluate arterial blood flow in the legs. Allow one hour for this exam. There are no restrictions or special instructions.  Follow-Up: Your physician wants you to follow-up in: 6 months with Dr. Radford Pax. You will receive a reminder letter in the mail two months in advance. If you don't receive a letter, please call our office to schedule the follow-up appointment.  Any Other Special Instructions Will Be Listed Below (If Applicable).     If you need a refill on your cardiac medications before your next appointment, please call your pharmacy.

## 2017-04-17 ENCOUNTER — Other Ambulatory Visit: Payer: Self-pay | Admitting: Cardiology

## 2017-04-17 DIAGNOSIS — I739 Peripheral vascular disease, unspecified: Secondary | ICD-10-CM

## 2017-04-25 ENCOUNTER — Ambulatory Visit (HOSPITAL_COMMUNITY)
Admission: RE | Admit: 2017-04-25 | Discharge: 2017-04-25 | Disposition: A | Discharge status: Home / Self Care | Payer: Medicare Other | Source: Ambulatory Visit | Attending: Cardiology | Admitting: Cardiology

## 2017-04-25 DIAGNOSIS — R252 Cramp and spasm: Secondary | ICD-10-CM | POA: Insufficient documentation

## 2017-04-25 DIAGNOSIS — E119 Type 2 diabetes mellitus without complications: Secondary | ICD-10-CM | POA: Insufficient documentation

## 2017-04-25 DIAGNOSIS — E785 Hyperlipidemia, unspecified: Secondary | ICD-10-CM | POA: Diagnosis not present

## 2017-04-25 DIAGNOSIS — I739 Peripheral vascular disease, unspecified: Secondary | ICD-10-CM

## 2017-04-25 DIAGNOSIS — I10 Essential (primary) hypertension: Secondary | ICD-10-CM | POA: Diagnosis not present

## 2017-10-20 ENCOUNTER — Ambulatory Visit: Payer: Medicare Other | Admitting: Cardiology

## 2017-11-04 NOTE — Progress Notes (Signed)
Cardiology Office Note:    Date:  11/05/2017   ID:  Norman [name], DOB 28-Dec-1945, MRN [id number]  PCP:  Antony Contras, MD  Cardiologist:  No primary care provider on file.    Referring MD: Antony Contras, MD   Chief Complaint  Patient presents with  . Atrial Fibrillation  . Coronary Artery Disease  . Hypertension  . Hyperlipidemia    History of Present Illness:    Norman [name] is a 72 y.o. male with a hx of permanentatrial fibrillation, carotid artery stenosis,coronary artery calcifications by CT, hyperlipidemia and dilated aortic root and HTN.  He is here today for followup and is doing well.  He denies any chest pain or pressure, SOB, DOE, PND, orthopnea, LE edema, dizziness, palpitations or syncope. He is compliant with his meds and is tolerating meds with no SE.    Past Medical History:  Diagnosis Date  . Arthritis of right ankle   . Blood in urine 2 1/2 WEEKS AGO  . BPH (benign prostatic hypertrophy)    Mild  . Coronary artery calcification seen on CAT scan 2011   no ischemia on nuclear stress test 2011  . Dilated aortic root ([facility])    78mm by echo 2017  . Epididymal cyst 2005   Bilateral and small bilateral hydroceles with most recent ultra sound  . Glaucoma   . Hypercholesteremia   . Hypertension   . Kidney stones    "I've passed them all"  . Migraine    "got off caffeine; they went away"  . Permanent atrial fibrillation ([place])   . Tendinitis    History in Rotator cuff  . TIA (transient ischemic attack) ?12/28/2013  . Type II diabetes mellitus ([place])     Past Surgical History:  Procedure Laterality Date  . CARDIOVERSION  X 1  . CYSTOSCOPY WITH RETROGRADE PYELOGRAM, URETEROSCOPY AND STENT PLACEMENT Right 08/18/2015   Procedure: CYSTOSCOPY WITH RIGHT  RETROGRADE PYELOGRAM, RIGHT URETEROGRAM.;  Surgeon: Kathie Rhodes, MD;  Location: [facility];  Service: Urology;  Laterality: Right;  . TONSILLECTOMY      Current Medications: Current Meds  Medication Sig  .  apixaban (ELIQUIS) 5 MG TABS tablet Take 1 tablet (5 mg total) by mouth 2 (two) times daily.  Marland Kitchen atorvastatin (LIPITOR) 80 MG tablet Take 80 mg by mouth every evening.   . bimatoprost (LUMIGAN) 0.03 % ophthalmic solution Place 1 drop into both eyes at bedtime.   Marland Kitchen CARTIA XT 240 MG 24 hr capsule Take 240 mg by mouth daily.  . COMBIGAN 0.2-0.5 % ophthalmic solution Place 1 drop into both eyes 2 (two) times daily.  . metFORMIN (GLUCOPHAGE) 500 MG tablet Take 500 mg by mouth daily with breakfast.  . metoprolol tartrate (LOPRESSOR) 25 MG tablet Take 25 mg by mouth 2 (two) times daily.  Marland Kitchen zolpidem (AMBIEN CR) 12.5 MG CR tablet Take 12.5 mg by mouth at bedtime as needed for sleep.     Allergies:   Patient has no known allergies.   Social History   Socioeconomic History  . Marital status: Married    Spouse name: Not on file  . Number of children: 2  . Years of education: college  . Highest education level: Not on file  Occupational History  . Not on file  Social Needs  . Financial resource strain: Not on file  . Food insecurity:    Worry: Not on file    Inability: Not on file  . Transportation needs:  Medical: Not on file    Non-medical: Not on file  Tobacco Use  . Smoking status: Never Smoker  . Smokeless tobacco: Never Used  Substance and Sexual Activity  . Alcohol use: Yes    Alcohol/week: 1.0 standard drinks    Types: 1 Glasses of wine per week    Comment: occ  . Drug use: No  . Sexual activity: Not on file  Lifestyle  . Physical activity:    Days per week: Not on file    Minutes per session: Not on file  . Stress: Not on file  Relationships  . Social connections:    Talks on phone: Not on file    Gets together: Not on file    Attends religious service: Not on file    Active member of club or organization: Not on file    Attends meetings of clubs or organizations: Not on file    Relationship status: Not on file  Other Topics Concern  . Not on file  Social History  Narrative   Patient is married with 2 children.   Patient is right handed.   Patient has college education.   Patient drinks very little caffeine.     Family History: The patient's family history includes Diabetes in his maternal aunt; Heart attack in his father; Kidney failure in his mother.  ROS:   Please see the history of present illness.    ROS  All other systems reviewed and negative.   EKGs/Labs/Other Studies Reviewed:    The following studies were reviewed today: none  EKG:  EKG is ordered today and showed atrial fibrillation with CVR at 65bpm. Recent Labs: No results found for requested labs within last 8760 hours.   Recent Lipid Panel    Component Value Date/Time   CHOL 113 12/29/2013 0400   TRIG 110 12/29/2013 0400   HDL 31 (L) 12/29/2013 0400   CHOLHDL 3.6 12/29/2013 0400   VLDL 22 12/29/2013 0400   LDLCALC 60 12/29/2013 0400    Physical Exam:    VS:  BP 134/82   Pulse 65   Ht 6' (1.829 m)   Wt 236 lb 3.2 oz (107.1 kg)   SpO2 97%   BMI 32.03 kg/m     Wt Readings from Last 3 Encounters:  11/05/17 236 lb 3.2 oz (107.1 kg)  04/15/17 238 lb 9.6 oz (108.2 kg)  10/16/16 228 lb 6.4 oz (103.6 kg)     GEN:  Well nourished, well developed in no acute distress HEENT: Normal NECK: No JVD; No carotid bruits LYMPHATICS: No lymphadenopathy CARDIAC: irregularly irregular, no murmurs, rubs, gallops RESPIRATORY:  Clear to auscultation without rales, wheezing or rhonchi  ABDOMEN: Soft, non-tender, non-distended MUSCULOSKELETAL:  No edema; No deformity  SKIN: Warm and dry NEUROLOGIC:  Alert and oriented x 3 PSYCHIATRIC:  Normal affect   ASSESSMENT:    1. Permanent atrial fibrillation ([place])   2. Essential hypertension, benign   3. Dilated aortic root ([place])   4. Coronary artery calcification seen on CAT scan   5. Type 2 diabetes mellitus with other circulatory complications ([facility])   6. Pure hypercholesterolemia    PLAN:    In order of problems listed  above:  1.  Permanent atrial fibrillation - his HR is well controlled on exam today.  He will continue on Cartia XT 240mg  daily, lopressor 25mg  BID and apixaban 5mg  BID.  I will check a BMET and CBC.   2.  HTN - BP is well controlled  on exam today.  He will continue on Cartia XT 240mg  daily and lopressor 25mg  BID.    3.  Dilated aortic root - 80mm by echo 2017  Repeat echo to make sure this is stable.  Continue statin.   4.  Coronary artery calcifications on Chest CT - he has no anginal sx.  Continue statin.  No ASA due to DOAC.   5.  Type 2 DM - followed by PCP  6.  Hyperlipidemia with LDL goal < 70 due to DM and dilated aortic root.  I will check FLP and ALt.   Medication Adjustments/Labs and Tests Ordered: Current medicines are reviewed at length with the patient today.  Concerns regarding medicines are outlined above.  No orders of the defined types were placed in this encounter.  No orders of the defined types were placed in this encounter.   Signed, Fransico Him, MD  11/05/2017 8:18 AM    [place]

## 2017-11-05 ENCOUNTER — Encounter: Payer: Self-pay | Admitting: Cardiology

## 2017-11-05 ENCOUNTER — Ambulatory Visit: Payer: Medicare Other | Admitting: Cardiology

## 2017-11-05 VITALS — BP 134/82 | HR 65 | Ht 72.0 in | Wt 236.2 lb

## 2017-11-05 DIAGNOSIS — I77810 Thoracic aortic ectasia: Secondary | ICD-10-CM

## 2017-11-05 DIAGNOSIS — I482 Chronic atrial fibrillation: Secondary | ICD-10-CM | POA: Diagnosis not present

## 2017-11-05 DIAGNOSIS — E1159 Type 2 diabetes mellitus with other circulatory complications: Secondary | ICD-10-CM

## 2017-11-05 DIAGNOSIS — I10 Essential (primary) hypertension: Secondary | ICD-10-CM

## 2017-11-05 DIAGNOSIS — E7800 Pure hypercholesterolemia, unspecified: Secondary | ICD-10-CM

## 2017-11-05 DIAGNOSIS — I2510 Atherosclerotic heart disease of native coronary artery without angina pectoris: Secondary | ICD-10-CM | POA: Diagnosis not present

## 2017-11-05 DIAGNOSIS — I4821 Permanent atrial fibrillation: Secondary | ICD-10-CM

## 2017-11-05 MED ORDER — APIXABAN 5 MG PO TABS: 5.0000 mg | ORAL_TABLET | Freq: Two times a day (BID) | ORAL | 3 refills | Status: DC

## 2017-11-05 NOTE — Addendum Note (Signed)
Addended by: Rodman Key on: 11/05/2017 08:45 AM   Modules accepted: Orders

## 2017-11-05 NOTE — Patient Instructions (Signed)
Your physician recommends that you continue on your current medications as directed. Please refer to the Current Medication list given to you today.  Your physician recommends that you return for lab work -fasting- (cbc, bmet, LFT/lipids)  Your physician wants you to follow-up in: 6 months with Dr. Radford Pax.  You will receive a reminder letter in the mail two months in advance. If you don't receive a letter, please call our office to schedule the follow-up appointment.

## 2017-11-07 ENCOUNTER — Other Ambulatory Visit: Payer: Medicare Other | Admitting: *Deleted

## 2017-11-07 DIAGNOSIS — E7800 Pure hypercholesterolemia, unspecified: Secondary | ICD-10-CM

## 2017-11-07 DIAGNOSIS — I4821 Permanent atrial fibrillation: Secondary | ICD-10-CM

## 2017-11-07 DIAGNOSIS — I10 Essential (primary) hypertension: Secondary | ICD-10-CM

## 2017-11-07 DIAGNOSIS — I2510 Atherosclerotic heart disease of native coronary artery without angina pectoris: Secondary | ICD-10-CM

## 2017-11-07 DIAGNOSIS — I77810 Thoracic aortic ectasia: Secondary | ICD-10-CM

## 2017-11-07 DIAGNOSIS — E1159 Type 2 diabetes mellitus with other circulatory complications: Secondary | ICD-10-CM

## 2017-11-07 LAB — HEPATIC FUNCTION PANEL
ALBUMIN: 4 g/dL (ref 3.5–4.8)
ALT: 21 IU/L (ref 0–44)
AST: 16 IU/L (ref 0–40)
Alkaline Phosphatase: 87 IU/L (ref 39–117)
Bilirubin Total: 0.8 mg/dL (ref 0.0–1.2)
Bilirubin, Direct: 0.19 mg/dL (ref 0.00–0.40)
TOTAL PROTEIN: 6.1 g/dL (ref 6.0–8.5)

## 2017-11-07 LAB — CBC
HEMOGLOBIN: 17.3 g/dL (ref 13.0–17.7)
Hematocrit: 51.3 % — ABNORMAL HIGH (ref 37.5–51.0)
MCH: 31.6 pg (ref 26.6–33.0)
MCHC: 33.7 g/dL (ref 31.5–35.7)
MCV: 94 fL (ref 79–97)
Platelets: 247 10*3/uL (ref 150–450)
RBC: 5.48 x10E6/uL (ref 4.14–5.80)
RDW: 13.7 % (ref 12.3–15.4)
WBC: 8.1 10*3/uL (ref 3.4–10.8)

## 2017-11-07 LAB — BASIC METABOLIC PANEL
BUN/Creatinine Ratio: 15 (ref 10–24)
BUN: 17 mg/dL (ref 8–27)
CALCIUM: 9.3 mg/dL (ref 8.6–10.2)
CHLORIDE: 101 mmol/L (ref 96–106)
CO2: 26 mmol/L (ref 20–29)
Creatinine, Ser: 1.13 mg/dL (ref 0.76–1.27)
GFR calc Af Amer: 74 mL/min/{1.73_m2} (ref >59)
GFR calc non Af Amer: 64 mL/min/{1.73_m2} (ref >59)
GLUCOSE: 125 mg/dL — AB (ref 65–99)
POTASSIUM: 4.6 mmol/L (ref 3.5–5.2)
Sodium: 140 mmol/L (ref 134–144)

## 2017-11-07 LAB — LIPID PANEL
CHOL/HDL RATIO: 3.5 ratio (ref 0.0–5.0)
Cholesterol, Total: 114 mg/dL (ref 100–199)
HDL: 33 mg/dL — AB (ref >39)
LDL CALC: 61 mg/dL (ref 0–99)
TRIGLYCERIDES: 98 mg/dL (ref 0–149)
VLDL Cholesterol Cal: 20 mg/dL (ref 5–40)

## 2017-11-11 ENCOUNTER — Telehealth: Payer: Self-pay | Admitting: Cardiology

## 2017-11-11 NOTE — Telephone Encounter (Signed)
Follow up: ° ° °Patient returning call back °

## 2017-11-11 NOTE — Telephone Encounter (Signed)
Spoke with patient about his normal lab results, he expressed understanding and had no further questions.

## 2017-11-18 ENCOUNTER — Telehealth: Payer: Self-pay | Admitting: Cardiology

## 2017-11-18 ENCOUNTER — Other Ambulatory Visit: Payer: Self-pay | Admitting: Urology

## 2017-11-18 IMAGING — MR MR ABDOMEN WO/W CM
17 series · 48 of 48 positions shown · IV contrast (multihance)
Comparison: No prior abdominal MRI. CT the abdomen and pelvis
07/26/2015.

CLINICAL DATA: 70-year-old male with history of gross hematuria for
the past 3 months, who reportedly recently passed a large urinary
tract calculus. Possible filling defects in the right renal
collecting system noted on recent CT examination.

EXAM:
MRI ABDOMEN WITHOUT AND WITH CONTRAST
TECHNIQUE: Multiplanar multisequence MR imaging of the abdomen was performed
both before and after the administration of intravenous contrast.
CONTRAST:  20mL MULTIHANCE GADOBENATE DIMEGLUMINE 529 MG/ML IV SOLN

[Series 3: T2 · coronal · 5.0mm · 1.56mm/px · 1 of 36 slices shown (1 of 3)]
[im 1/36]
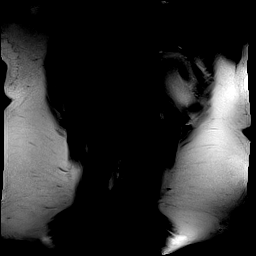

[Series 4: T1 · axial · 3.0mm · 1.19mm/px · z∈[-63,+150]mm · 5 of 144 slices shown]
[im 1/144]
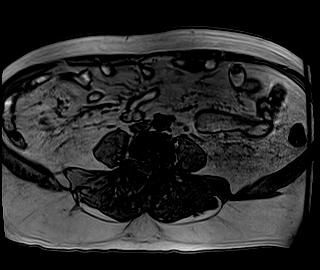
[im 36/144]
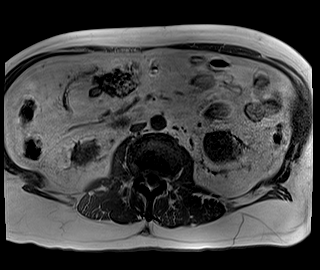
[im 72/144]
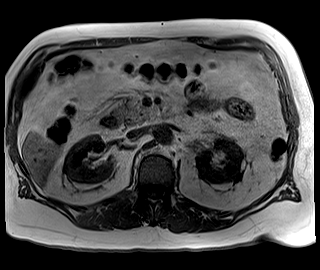
[im 108/144]
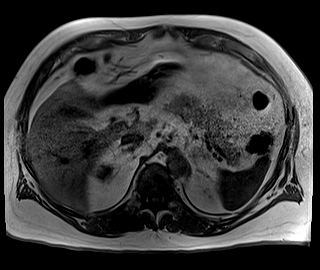
[im 144/144]
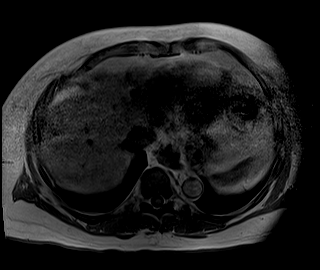

[Series 5: T2 · axial · 5.0mm · 1.48mm/px · z∈[-65,+157]mm · 2 of 38 slices shown (2 of 3)]
[im 1/38]
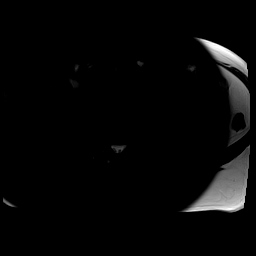
[im 38/38]
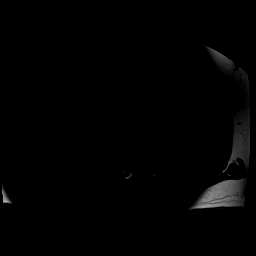

[Series 6: DWI · axial · 5.0mm · 1.42mm/px · z∈[-65,+157]mm · 5 of 114 slices shown (1 of 2)]
[im 1/114]
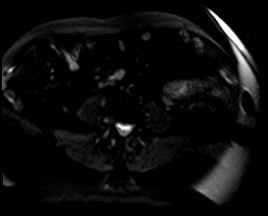
[im 29/114]
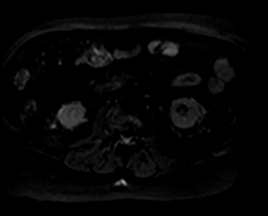
[im 57/114]
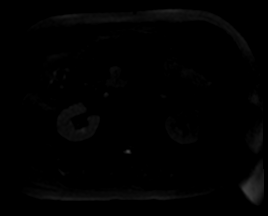
[im 85/114]
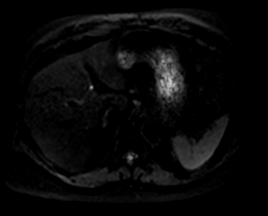
[im 114/114]
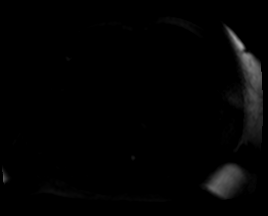

[Series 7: DWI · axial · 5.0mm · 1.42mm/px · z∈[-65,+157]mm · 2 of 38 slices shown (2 of 2)]
[im 1/38]
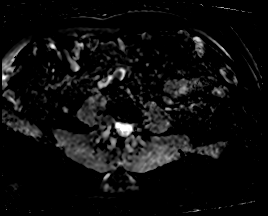
[im 38/38]
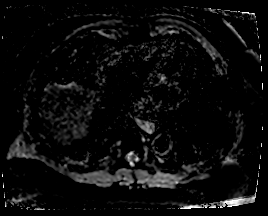

[Series 8: T2 · axial · 6.0mm · 1.22mm/px · 1 of 30 slices shown (3 of 3)]
[im 1/30]
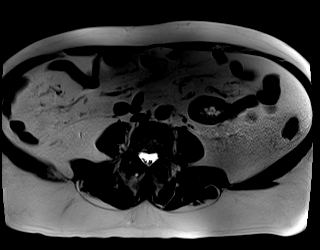

[Series 9: bSSFP · axial · 5.0mm · 1.25mm/px · z∈[-68,+154]mm · 2 of 38 slices shown]
[im 1/38]
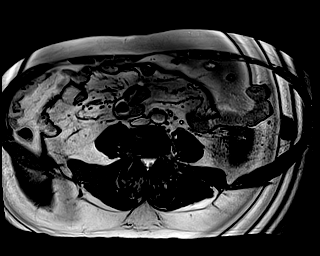
[im 38/38]
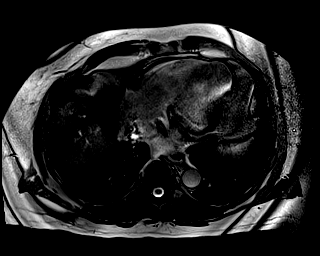

[Series 10: T1 dynamic · axial · non-contrast · 3.0mm · 1.25mm/px · z∈[-63,+150]mm · 3 of 72 slices shown]
[im 1/72]
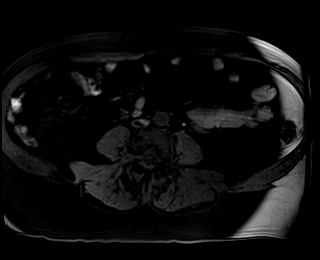
[im 36/72]
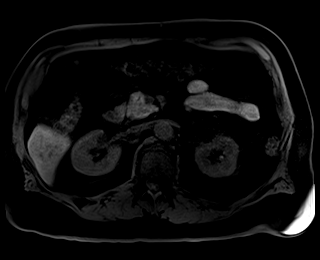
[im 72/72]
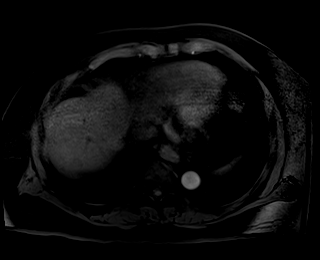

[Series 11: T1 dynamic post-contrast · axial · 3.0mm · 1.25mm/px · z∈[-63,+150]mm · 3 of 72 slices shown (1 of 9)]
[im 1/72]
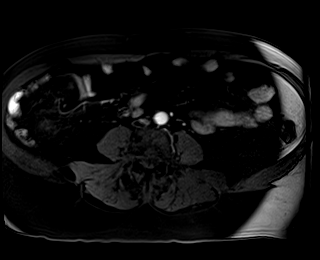
[im 36/72]
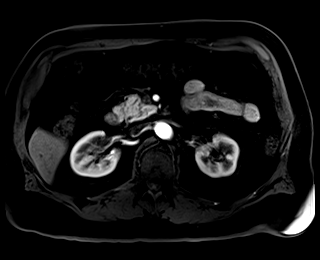
[im 72/72]
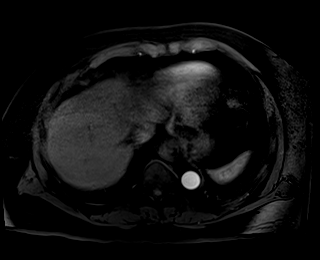

[Series 12: T1 dynamic post-contrast · axial · 3.0mm · 1.25mm/px · z∈[-63,+150]mm · 3 of 72 slices shown (2 of 9)]
[im 1/72]
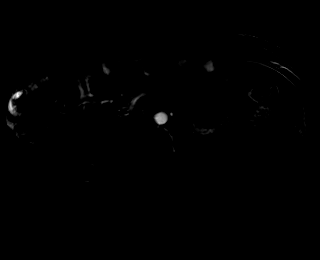
[im 36/72]
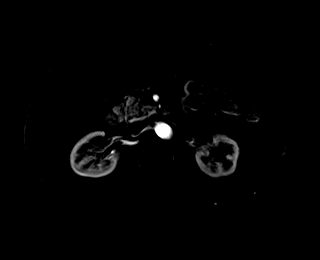
[im 72/72]
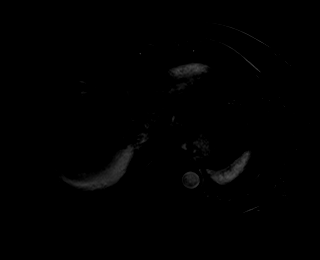

[Series 13: T1 dynamic post-contrast · axial · 3.0mm · 1.25mm/px · z∈[-63,+150]mm · 3 of 72 slices shown (3 of 9)]
[im 1/72]
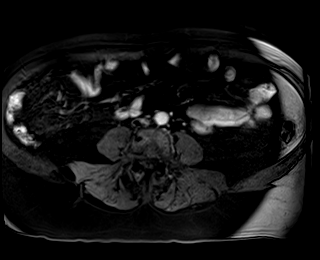
[im 36/72]
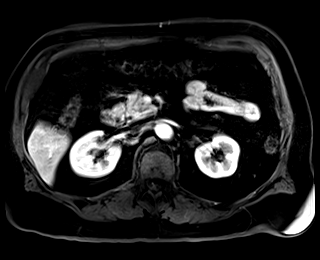
[im 72/72]
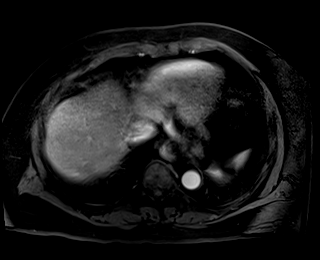

[Series 14: T1 dynamic post-contrast · axial · 3.0mm · 1.25mm/px · z∈[-63,+150]mm · 3 of 72 slices shown (4 of 9)]
[im 1/72]
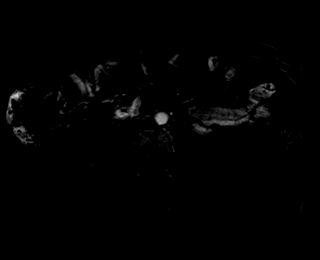
[im 36/72]
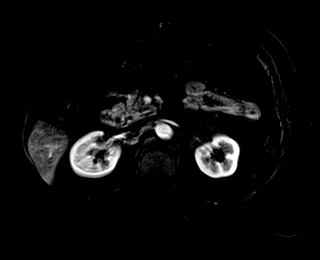
[im 72/72]
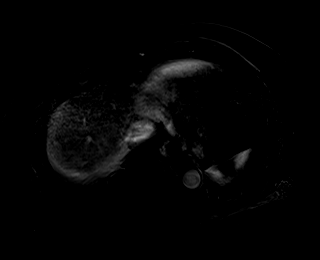

[Series 15: T1 dynamic post-contrast · axial · 3.0mm · 1.25mm/px · z∈[-63,+150]mm · 3 of 72 slices shown (5 of 9)]
[im 1/72]
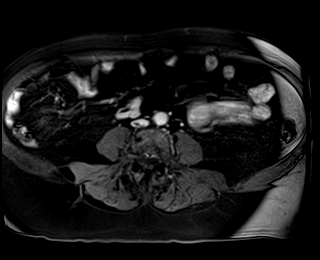
[im 36/72]
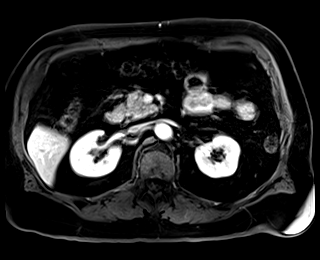
[im 72/72]
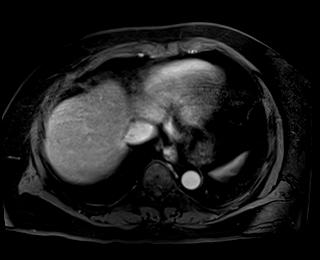

[Series 16: T1 dynamic post-contrast · axial · 3.0mm · 1.25mm/px · z∈[-63,+150]mm · 3 of 72 slices shown (6 of 9)]
[im 1/72]
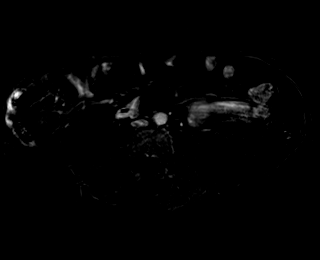
[im 36/72]
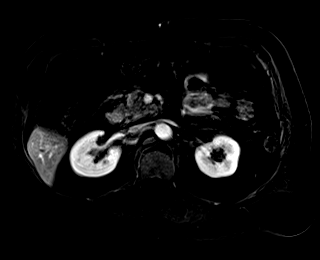
[im 72/72]
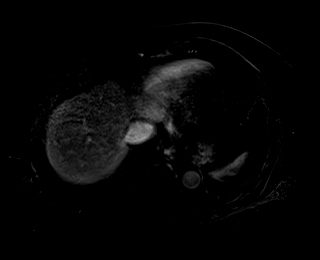

[Series 17: T1 dynamic post-contrast · coronal · 3.0mm · 1.25mm/px · 3 of 72 slices shown (7 of 9)]
[im 1/72]
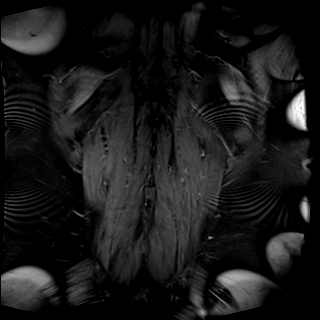
[im 36/72]
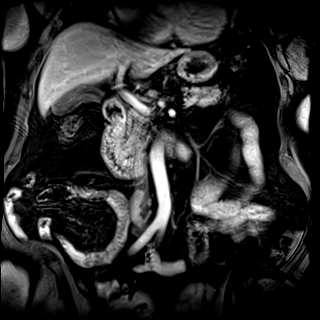
[im 72/72]
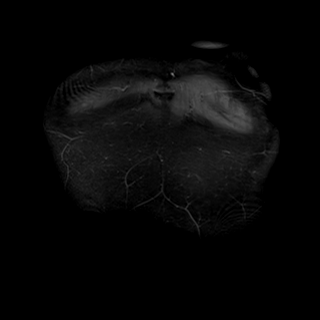

[Series 18: T1 dynamic post-contrast · axial · 3.0mm · 1.25mm/px · z∈[-63,+150]mm · 3 of 72 slices shown (8 of 9)]
[im 1/72]
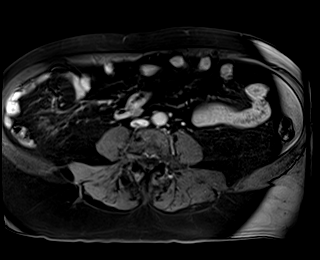
[im 36/72]
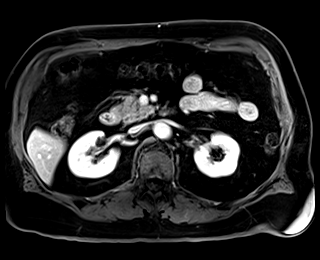
[im 72/72]
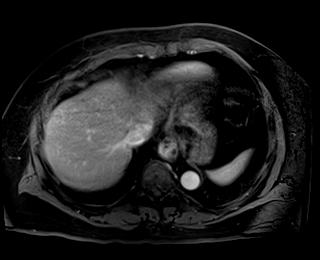

[Series 19: T1 dynamic post-contrast · axial · 3.0mm · 1.25mm/px · z∈[-63,+150]mm · 3 of 72 slices shown (9 of 9)]
[im 1/72]
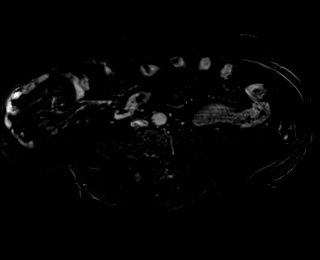
[im 36/72]
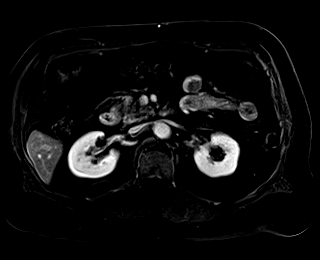
[im 72/72]
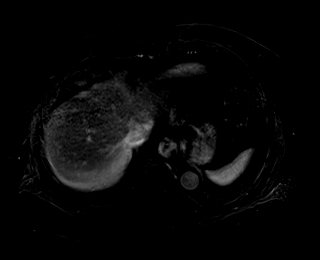

[48 of 48 positions shown; findings below may reference images not displayed]

FINDINGS: Creatinine was obtained on site at [HOSPITAL] at [HOSPITAL].

Results: Creatinine 1.2 mg/dL.

Lower chest:  Remarkable.

Hepatobiliary: No cystic or solid hepatic lesions. No intra or
extrahepatic biliary ductal dilatation. Gallbladder is normal in
appearance.

Pancreas: No pancreatic mass. No pancreatic ductal dilatation. No
pancreatic or peripancreatic fluid or inflammatory changes.

Spleen: Unremarkable.

Adrenals/Urinary Tract: In the lower pole of the right kidney
associated with a medullary pyramid there is a tiny 8 mm lesion
which is low T1 signal intensity, high T2 signal intensity, and does
not enhance, compatible with a tiny cyst. This accounts for the
perceived abnormality in the lower pole collecting system on the
recent CT examination. There is no finding to account for the
perceived abnormality in the upper pole collecting system of the
right kidney on the recent CT examination. There are multiple renal
lesions bilaterally which are low T1 signal intensity, high T2
signal intensity, and do not enhance, compatible with simple cysts,
largest of which is in the lower pole of the left kidney measuring
2.1 cm in diameter. No hydroureteronephrosis in the visualized
abdomen. Right adrenal gland is normal in appearance. In the left
adrenal gland there is a 1.3 x 1.4 cm nodule which is slightly
heterogeneous in signal intensity, but demonstrates subtle loss of
signal intensity on out of phase dual echo images, and demonstrated
appropriate washout characteristics on the CT scan, compatible with
an adenoma.

Stomach/Bowel: Visualized portions are unremarkable.

Vascular/Lymphatic: No aneurysm identified in the visualized
abdominal vasculature. No lymphadenopathy noted in the abdomen.

Other: No significant volume of ascites in the visualized peritoneal
cavity.

Musculoskeletal: No aggressive osseous lesions are noted in the
visualized portions of the skeleton.
IMPRESSION: 1. Multiple cysts in the kidneys bilaterally are simple in
appearance. The lesion on the recent CT scan associated with the
lower pole collecting system in the right kidney is compatible with
a tiny cyst in the region of the medullary pyramid. There is no
abnormality on today's examination to correlate to the perceived
abnormality in the upper pole collecting system on the recent CT
examination.
2. Small left adrenal adenoma again noted.
3. Additional incidental findings, as above.

## 2017-11-18 NOTE — Telephone Encounter (Signed)
° ° °  ° °  [facility] Pre-operative Risk Assessment    Request for surgical clearance:  1. What type of surgery is being performed? CYSTOSCOPY WITH LITHOLAPAXY  2. When is this surgery scheduled? 12/05/17  3. What type of clearance is required (medical clearance vs. Pharmacy clearance to hold med vs. Both)? Both  4. Are there any medications that need to be held prior to surgery and how long? Eliquis  2 days  5. Practice name and name of physician performing surgery? Dr Karsten Ro, [facility]  6. What is your office phone number [phone] ext [phone] Connie   7.   What is your office fax number [phone]  8.   Anesthesia type (None, local, MAC, general) ? general   Laurier Nancy 11/18/2017, 3:31 PM  _________________________________________________________________   (provider comments below)

## 2017-11-20 NOTE — Patient Instructions (Signed)
TORREN MAFFEO  11/20/2017   Your procedure is scheduled on:12/05/2017    Report to [facility] Main  Entrance  Report to admitting at    0900 AM    Call this number if you have problems the morning of surgery [phone]   Remember: Do not eat food or drink liquids :After Midnight.     Take these medicines the morning of surgery with A SIP OF WATER:Eye drops as usual, Tiazac ( Diltiazaem) , metoprolol ( Lopressor)  DO NOT TAKE ANY DIABETIC MEDICATIONS DAY OF YOUR SURGERY                               You may not have any metal on your body including hair pins and              piercings  Do not wear jewelry, , lotions, powders or perfumes, deodorant                          Men may shave face and neck.   Do not bring valuables to the hospital. [place].  Contacts, dentures or bridgework may not be worn into surgery.      Patients discharged the day of surgery will not be allowed to drive home.  Name and phone number of your driver:                Please read over the following fact sheets you were given: _____________________________________________________________________             [facility] - Preparing for Surgery Before surgery, you can play an important role.  Because skin is not sterile, your skin needs to be as free of germs as possible.  You can reduce the number of germs on your skin by washing with CHG (chlorahexidine gluconate) soap before surgery.  CHG is an antiseptic cleaner which kills germs and bonds with the skin to continue killing germs even after washing. Please DO NOT use if you have an allergy to CHG or antibacterial soaps.  If your skin becomes reddened/irritated stop using the CHG and inform your nurse when you arrive at Short Stay. Do not shave (including legs and underarms) for at least 48 hours prior to the first CHG shower.  You may shave your face/neck. Please follow these  instructions carefully:  1.  Shower with CHG Soap the night before surgery and the  morning of Surgery.  2.  If you choose to wash your hair, wash your hair first as usual with your  normal  shampoo.  3.  After you shampoo, rinse your hair and body thoroughly to remove the  shampoo.                           4.  Use CHG as you would any other liquid soap.  You can apply chg directly  to the skin and wash                       Gently with a scrungie or clean washcloth.  5.  Apply the CHG Soap to your body ONLY FROM THE NECK DOWN.  Do not use on face/ open                           Wound or open sores. Avoid contact with eyes, ears mouth and genitals (private parts).                       Wash face,  Genitals (private parts) with your normal soap.             6.  Wash thoroughly, paying special attention to the area where your surgery  will be performed.  7.  Thoroughly rinse your body with warm water from the neck down.  8.  DO NOT shower/wash with your normal soap after using and rinsing off  the CHG Soap.                9.  Pat yourself dry with a clean towel.            10.  Wear clean pajamas.            11.  Place clean sheets on your bed the night of your first shower and do not  sleep with pets. Day of Surgery : Do not apply any lotions/deodorants the morning of surgery.  Please wear clean clothes to the hospital/surgery center.  FAILURE TO FOLLOW THESE INSTRUCTIONS MAY RESULT IN THE CANCELLATION OF YOUR SURGERY PATIENT SIGNATURE_________________________________  NURSE SIGNATURE__________________________________  ________________________________________________________________________

## 2017-11-20 NOTE — Telephone Encounter (Signed)
Patient with diagnosis of Afib on Eliquis for anticoagulation.    Procedure: cystoscopy with litholapaxy Date of procedure: 12/05/17  CHADS2-VASc score of  6 (CHF, HTN, AGE, DM2, stroke/tia x 2, CAD, AGE, male)  CrCl 88ml/min  Per office protocol, patient can hold 24 hours for Eliquis days prior to procedure. With history of TIA would recommend resume anticoagulation as soon as safe.

## 2017-11-20 NOTE — Telephone Encounter (Addendum)
   Primary Cardiologist: Fransico Him, MD  Chart reviewed as part of pre-operative protocol coverage. Given past medical history and time since last visit, based on ACC/AHA guidelines, Norman [name] would be at acceptable risk for the planned procedure without further cardiovascular testing.   See pharmacy recommendations for Eliquis.  I will route this recommendation to the requesting party via Epic fax function and remove from pre-op pool.  Please call with questions.  Rosaria Ferries, PA-C 11/20/2017, 1:51 PM

## 2017-11-21 ENCOUNTER — Encounter (HOSPITAL_COMMUNITY): Payer: Self-pay

## 2017-11-21 ENCOUNTER — Encounter (HOSPITAL_COMMUNITY)
Admission: RE | Admit: 2017-11-21 | Discharge: 2017-11-21 | Disposition: A | Discharge status: Home / Self Care | Payer: Medicare Other | Source: Ambulatory Visit | Attending: Urology | Admitting: Urology

## 2017-11-21 ENCOUNTER — Other Ambulatory Visit: Payer: Self-pay

## 2017-11-21 DIAGNOSIS — Z01812 Encounter for preprocedural laboratory examination: Secondary | ICD-10-CM | POA: Diagnosis present

## 2017-11-21 DIAGNOSIS — E119 Type 2 diabetes mellitus without complications: Secondary | ICD-10-CM | POA: Diagnosis not present

## 2017-11-21 HISTORY — DX: Cerebral infarction, unspecified: I63.9

## 2017-11-21 HISTORY — DX: Personal history of urinary calculi: Z87.442

## 2017-11-21 HISTORY — DX: Anxiety disorder, unspecified: F41.9

## 2017-11-21 HISTORY — DX: Cardiac arrhythmia, unspecified: I49.9

## 2017-11-21 LAB — CBC
HCT: 51.4 % (ref 39.0–52.0)
Hemoglobin: 17.4 g/dL — ABNORMAL HIGH (ref 13.0–17.0)
MCH: 31.8 pg (ref 26.0–34.0)
MCHC: 33.9 g/dL (ref 30.0–36.0)
MCV: 93.8 fL (ref 78.0–100.0)
Platelets: 241 10*3/uL (ref 150–400)
RBC: 5.48 MIL/uL (ref 4.22–5.81)
RDW: 13.2 % (ref 11.5–15.5)
WBC: 8.1 10*3/uL (ref 4.0–10.5)

## 2017-11-21 LAB — BASIC METABOLIC PANEL
Anion gap: 11 (ref 5–15)
BUN: 21 mg/dL (ref 8–23)
CHLORIDE: 101 mmol/L (ref 98–111)
CO2: 25 mmol/L (ref 22–32)
Calcium: 9.2 mg/dL (ref 8.9–10.3)
Creatinine, Ser: 1.27 mg/dL — ABNORMAL HIGH (ref 0.61–1.24)
GFR calc Af Amer: >60 mL/min (ref >60)
GFR calc non Af Amer: 54 mL/min — ABNORMAL LOW (ref >60)
Glucose, Bld: 175 mg/dL — ABNORMAL HIGH (ref 70–99)
Potassium: 4 mmol/L (ref 3.5–5.1)
SODIUM: 137 mmol/L (ref 135–145)

## 2017-11-21 LAB — HEMOGLOBIN A1C
Hgb A1c MFr Bld: 6.2 % — ABNORMAL HIGH (ref 4.8–5.6)
Mean Plasma Glucose: 131.24 mg/dL

## 2017-11-21 LAB — GLUCOSE, CAPILLARY: Glucose-Capillary: 209 mg/dL — ABNORMAL HIGH (ref 70–99)

## 2017-11-21 NOTE — Progress Notes (Addendum)
EKG - 10/2017 - epic  ECHO-2017-epic  11/18/2017- clearance - in epic

## 2017-12-04 NOTE — H&P (Signed)
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  He did see the blood in his urine.   He does not have a burning sensation when he urinates. He is not currently having trouble urinating.   He has had kidney stones. He is not having pain. He has not recently had unwanted weight loss.   07/08/17: Patient with past history of gross hematuria and renal stones. He underwent evaluation in 2017. C.T imaging at the time revealed no bladder abnormalities. Bilateral renal calculi were identified and there also appeared to be a filling defect in the right upper pole calyx and the lower pole calyx. There was questionable renal lesions noted bilaterally; therefore, he underwent MRI, as well. He was noted to have multiple cysts in kidneys bilaterally that were simple in appearance. Lesion on C.T imaging associated with lower pole collecting system of right kidney was noted to be cyst in the region of medullary pyramid. Cysto in the office at the time was unsuccessful due to distal urethral stricture. He underwent cystoscopy and right retrograde pyelogram on 08/18/15. Retrograde urethrogram revealed a normal appearing ureter throughout its length without evidence of stricture. No defects noted within the bladder. Urethral dilation was not indicated. Right retrograde pyelogram revealed no abnormalities. He presents today with complains of gross hematuria that began about 3 days ago. He denies passing any large clots or having difficulties voiding. He denies any unilateral flank pain or abdominal pain. He denies dysuria, exacerbation of voiding symptoms, nausea, vomiting, fever, or chills. Currently on Eliquis for history of A. Fib. No dizziness or lightheadedness today.   07/22/17: He returns today for follow up. He underwent C.T imaging on 4/22, which noted left sided hydronephrosis secondary to approximatly 5 x 7 mm mid to distal left ureteral calculus.  Bilateral nephrolithiasis also noted. He was started on trial of MET. Today, he states that he has been having some left flank and LLQ pain that are intermittent. He has not been having to use any pain medication for this. He continues to have some intermittent episodes of gross hematuria, but denies clots. He denies exacerbation of voiding symptoms. He states that over the past three days he has noted some dizziness and diarrhea, which he feels may be related to use of Tamsulosin. He is tolerating PO intake well. He remain on Eliquis for A. fib and Dr. Golden Hurter is his cardiologist.   07/29/17: He underwent C.T imaging on 4/22, which noted left sided hydronephrosis secondary to approximatly 5 x 7 mm mid to distal left ureteral calculus. Looking back at his images, there was also a 2 mm opacity more proximal to larger calculus. He elected to proceed with MET. He returns today for follow up. He denies seeing a stone pass in the interim. He denies any current flank or abdominal pain. He denies exacerbation of voiding symptoms or difficulties voiding. He denies fever, chills, nausea, or vomiting. He stopped Tamsulosin over the weekend and states that dizziness subsided.   08/12/17: He returns today for follow-up imaging for his known left ureteral stones however I reviewed his KUB done on 07/29/17 and on that study the stone that was previously located his left ureter had passed into the bladder. There was a possible 2 mm stone in the ureter proximal to the larger distal stone.  He passed a 2 mm calculus and brought that in with him today. He is not having any hematuria and  no flank pain. In addition he has no voiding symptoms.    10/07/17: At last office visit it was determined the ureteral calculi had passed into the bladder, observation was elected. Today he presents having 2 incidences of gross hematuria yesterday as well as over the weekend. Not associated with any pain or changes in voiding habits. Denies  any interval passage of stone material.     CC: I have bladder stones.  HPI: His problem was discovered 08/12/2017. He has had no symptoms.   He has not had prior urinary tract or prostate infections.   He does not have urgency. He does not have frequency. He does not have trouble emptying his bladder at this time. He does not have pain or burning when he urinates.   He has not previously had an indwelling catheter in for more than two weeks at a time.   11/11/17: He had a left ureteral stone that appeared to have passed into his bladder in 5/19. At that time he was completely asymptomatic. We discussed seeing if he passed the stone spontaneously and he reports he has not seen any stone pass but he also has not had any further hematuria.     ALLERGIES: No Allergies    MEDICATIONS: Atorvastatin Calcium 80 mg tablet Oral  Diltiazem 24Hr Er (Cd) 240 mg capsule, ext release 24 hr Oral  Eliquis  Lumigan 0.03 % drops Ophthalmic  Metformin Hcl 500 mg tablet Oral  Metoprolol Tartrate 25 mg tablet Oral  Zolpidem Tartrate Er 12.5 mg tablet, extended release multiphase Oral     GU PSH: Locm 300-399Mg/Ml Iodine,1Ml - 07/14/2017      PSH Notes: No Surgical Problems   NON-GU PSH: None   GU PMH: Gross hematuria - 10/07/2017, - 07/29/2017, - 07/22/2017, - 07/08/2017, Gross hematuria, - 2017 Bladder Stone, The stone that was in his ureter is now in his bladder. He is completely asymptomatic. What I have recommended is we see if it passes spontaneously over the course of the next 3 months. If it does not cystoscopic removal would be required. - 08/12/2017 Ureteral calculus (Acute), Left, It appears all of his ureteral stones have passed. - 08/12/2017, - 07/29/2017 Other Disorder Kidney/ureter, Renal mass, left - 2017 Abnormal radiologic findings on diagnositic imaging of unspecified kidney, Kidney filling defect - 2017 Benign Neo Lft adrenal gland, Adrenal adenoma, left - 2017 Neoplasm of unspecified  behavior of unspecified kidney, Renal neoplasm - 2017 Renal calculus, Renal calculus, bilateral - 2017 Renal cyst, Bilateral renal cysts - 2017 Urethral Stricture, Unspec, Urethral stricture - 2017 Urinary Tract Inf, Unspec site, Enterococcus UTI - 2017 BPH w/LUTS, Incomplete emptying of bladder due to benign prostatic hyperplasia - 2017 Epididymal cyst, Epididymal cyst - 2017      PMH Notes: Gross hematuria: He recently experienced gross, painless hematuria on Coumadin for atrial fibrillation with a therapeutic INR. He has a history of stones in the past but was not having any flank pain and reports he had a prior episode of hematuria that resolved spontaneously.   He has a known history of bilateral epididymal cyst seen on ultrasound in 2005.   Elevated PVR: He was seen in the ER in 4/17 and his urine had TNTC RBCs with 3-6 white blood cells and of course he was put on antibiotics. An ultrasound PVR was checked and found to be 325 cc 15 minutes after he had voided.       NON-GU PMH: Personal history of other diseases of  the digestive system, History of diverticulitis of colon - 05/21/2015 Encounter for general adult medical examination without abnormal findings, Encounter for preventive health examination - 21-May-2015 Personal history of other diseases of the circulatory system, History of hypertension - 05/21/15, History of atrial fibrillation, - 2015/05/21 Personal history of other diseases of the nervous system and sense organs, History of glaucoma - 05/21/15 Personal history of other endocrine, nutritional and metabolic disease, History of hypercholesterolemia - 05/21/15, History of type 2 diabetes mellitus, - 05-21-15 Personal history of transient ischemic attack (TIA), and cerebral infarction without residual deficits, History of transient cerebral ischemia - 21-May-2015    FAMILY HISTORY: Death - Mother, Father No pertinent family history - Runs In Family   SOCIAL HISTORY: Marital Status: Married Preferred Language:  English; Ethnicity: Not Hispanic Or Latino; Race: White Current Smoking Status: Patient has never smoked.   Tobacco Use Assessment Completed: Used Tobacco in last 30 days? Types of alcohol consumed: Wine. Social Drinker.  Does not drink caffeine. Patient's occupation is/was Retired.     Notes: Number of children, Retired, Never a smoker, Married, Alcohol use   REVIEW OF SYSTEMS:    GU Review Male:   Patient denies frequent urination, hard to postpone urination, burning/ pain with urination, get up at night to urinate, leakage of urine, stream starts and stops, trouble starting your stream, have to strain to urinate , erection problems, and penile pain.  Gastrointestinal (Upper):   Patient denies nausea, vomiting, and indigestion/ heartburn.  Gastrointestinal (Lower):   Patient denies diarrhea and constipation.  Constitutional:   Patient denies fever, night sweats, weight loss, and fatigue.  Skin:   Patient denies skin rash/ lesion and itching.  Eyes:   Patient denies blurred vision and double vision.  Ears/ Nose/ Throat:   Patient denies sore throat and sinus problems.  Hematologic/Lymphatic:   Patient denies swollen glands and easy bruising.  Cardiovascular:   Patient denies leg swelling and chest pains.  Respiratory:   Patient denies cough and shortness of breath.  Endocrine:   Patient denies excessive thirst.  Musculoskeletal:   Patient denies back pain and joint pain.  Neurological:   Patient denies headaches and dizziness.  Psychologic:   Patient denies depression and anxiety.   VITAL SIGNS:      11/11/2017 11:30 AM  Weight 234 lb / 106.14 kg  Height 72 in / 182.88 cm  BP 143/102 mmHg  Pulse 79 /min  BMI 31.7 kg/m   MULTI-SYSTEM PHYSICAL EXAMINATION:    Constitutional: Well-nourished. No physical deformities. Normally developed. Good grooming.  Respiratory: Normal breath sounds. No labored breathing, no use of accessory muscles.   Cardiovascular: Abnormal heart rhythm.  Normal temperature, no swelling, no varicosities.  Skin: No paleness, no jaundice, no cyanosis. No lesion, no ulcer, no rash. Urethral Meatus: Subcoronal hypospadias . Normal size. No lesion, no wart, no polyp, no balanitis, no discharge.   Penis: Circumcised, no warts, no cracks. No dorsal Peyronie's plaques, no left corporal Peyronie's plaques, no right corporal Peyronie's plaques, no scarring, no warts. No balanitis, no meatal stenosis.   Neurologic / Psychiatric: Oriented to time, oriented to place, oriented to person. No depression, no anxiety, no agitation.  Gastrointestinal: Obese abdomen. No mass, no tenderness, no rigidity.   Musculoskeletal: Normal gait and station of head and neck. No CVA tenderness.        PAST DATA REVIEWED:  Source Of History:  Patient  Records Review:   Previous Patient Records, POC Tool  X-Ray Review: KUB:  Reviewed Films. Previous KUB images were reviewed and compared with today's study.    09/15/15  PSA  Total PSA 2.22     PROCEDURES:         Cysto Uretheral Dilation - done on 11/11/17  Risks, benefits, and some of the potential complications of the procedure were discussed at length with the patient including infection, bleeding, voiding discomfort, urinary retention, fever, chills, sepsis, and others. All questions were answered. Informed consent was obtained. Sterile technique and 2% Lidocaine intraurethral analgesia were used.  Meatus:  Mild stenosis. Coronal hypospadias. Normal condition. His meatal stenosis would not allow passage of the flexible cystoscope so he was dilated from 14-20 [place] without difficulty.  Urethra:  No strictures.  External Sphincter:  Normal.  Verumontanum:  Normal.  Prostate:  Obstructing. Moderate hyperplasia.  Bladder Neck:  Non-obstructing.  Ureteral Orifices:  Normal location. Normal size. Normal shape. Effluxed clear urine.  Bladder:  Mild trabeculation. One small stone. No tumors. Normal mucosa.      The lower  urinary tract was carefully examined. The procedure was well-tolerated and without complications. Instructions were given to call the office immediately for bloody urine, difficulty urinating, urinary retention, painful or frequent urination, fever or other illness. The patient stated that he understood these instructions and would comply with them.          KUB - [id number]  A single view of the abdomen is obtained.      Independent review of his KUB today reveals at least 1 and possibly 2 stones in the right kidney although there is overlying bowel gas obscuring the kidney. There are at least 4 stones seen in the left kidney. No stones are seen along the course of the ureter. There does appear to be calcification in the midline         Urinalysis Dipstick Dipstick Cont'd  Color: Yellow Bilirubin: Neg mg/dL  Appearance: Clear Ketones: Neg mg/dL  Specific Gravity: 1.010 Blood: Neg ery/uL  pH: 6.0 Protein: Neg mg/dL  Glucose: Neg mg/dL Urobilinogen: 0.2 mg/dL    Nitrites: Neg    Leukocyte Esterase: Neg leu/uL    ASSESSMENT/PLAN:      ICD-10 Details  1 GU:   Bladder Stone - N21.0 Stable - He does have a stone in his bladder. We therefore discussed proceeding with cystolitholapaxy. I went over the procedure with him in detail including its risks and complications. We discussed the probability of success and the outpatient nature of the procedure as well as the anticipated postoperative course. He will stop his Eliquis 2 days prior to the procedure.  2   Gross hematuria - R31.0 Improving - His hematuria has decreased with the last occurrence about 6 weeks ago.  3   Meatal stenosis (other urethral stricture) - N35.8 Stable - He has a mild subcoronal hypospadias that required dilation today in order to perform cystoscopy.

## 2017-12-04 NOTE — Discharge Instructions (Signed)

## 2017-12-05 ENCOUNTER — Ambulatory Visit (HOSPITAL_COMMUNITY): Payer: Medicare Other

## 2017-12-05 ENCOUNTER — Encounter (HOSPITAL_COMMUNITY): Payer: Self-pay | Admitting: *Deleted

## 2017-12-05 ENCOUNTER — Encounter (HOSPITAL_COMMUNITY)
Admission: RE | Disposition: A | Discharge status: Home / Self Care | Payer: Self-pay | Source: Other Acute Inpatient Hospital | Attending: Urology

## 2017-12-05 ENCOUNTER — Ambulatory Visit (HOSPITAL_COMMUNITY): Payer: Medicare Other | Admitting: Certified Registered"

## 2017-12-05 ENCOUNTER — Telehealth (HOSPITAL_COMMUNITY): Payer: Self-pay | Admitting: *Deleted

## 2017-12-05 ENCOUNTER — Ambulatory Visit (HOSPITAL_COMMUNITY)
Admission: RE | Admit: 2017-12-05 | Discharge: 2017-12-05 | Disposition: A | Discharge status: Home / Self Care | Payer: Medicare Other | Source: Other Acute Inpatient Hospital | Attending: Urology | Admitting: Urology

## 2017-12-05 DIAGNOSIS — N210 Calculus in bladder: Secondary | ICD-10-CM | POA: Diagnosis not present

## 2017-12-05 DIAGNOSIS — E119 Type 2 diabetes mellitus without complications: Secondary | ICD-10-CM | POA: Insufficient documentation

## 2017-12-05 DIAGNOSIS — Z87442 Personal history of urinary calculi: Secondary | ICD-10-CM | POA: Insufficient documentation

## 2017-12-05 DIAGNOSIS — Z7901 Long term (current) use of anticoagulants: Secondary | ICD-10-CM | POA: Diagnosis not present

## 2017-12-05 DIAGNOSIS — Z8673 Personal history of transient ischemic attack (TIA), and cerebral infarction without residual deficits: Secondary | ICD-10-CM | POA: Insufficient documentation

## 2017-12-05 DIAGNOSIS — R310 Gross hematuria: Secondary | ICD-10-CM | POA: Diagnosis not present

## 2017-12-05 DIAGNOSIS — I4891 Unspecified atrial fibrillation: Secondary | ICD-10-CM | POA: Insufficient documentation

## 2017-12-05 DIAGNOSIS — Z79899 Other long term (current) drug therapy: Secondary | ICD-10-CM | POA: Insufficient documentation

## 2017-12-05 DIAGNOSIS — E7800 Pure hypercholesterolemia, unspecified: Secondary | ICD-10-CM | POA: Diagnosis not present

## 2017-12-05 DIAGNOSIS — N35911 Unspecified urethral stricture, male, meatal: Secondary | ICD-10-CM | POA: Insufficient documentation

## 2017-12-05 DIAGNOSIS — Q549 Hypospadias, unspecified: Secondary | ICD-10-CM | POA: Diagnosis not present

## 2017-12-05 DIAGNOSIS — Z7984 Long term (current) use of oral hypoglycemic drugs: Secondary | ICD-10-CM | POA: Diagnosis not present

## 2017-12-05 HISTORY — PX: CYSTOSCOPY WITH LITHOLAPAXY: SHX1425

## 2017-12-05 LAB — GLUCOSE, CAPILLARY
Glucose-Capillary: 132 mg/dL — ABNORMAL HIGH (ref 70–99)
Glucose-Capillary: 142 mg/dL — ABNORMAL HIGH (ref 70–99)

## 2017-12-05 SURGERY — CYSTOSCOPY, WITH BLADDER CALCULUS LITHOLAPAXY
Anesthesia: General | Site: Bladder

## 2017-12-05 MED ORDER — PHENAZOPYRIDINE HCL 200 MG PO TABS: 200.0000 mg | ORAL_TABLET | Freq: Three times a day (TID) | ORAL | 0 refills | Status: DC | PRN

## 2017-12-05 MED ORDER — MIDAZOLAM HCL 2 MG/2ML IJ SOLN
INTRAMUSCULAR | Status: DC | PRN
  Administered 2017-12-05: 2 mg via INTRAVENOUS

## 2017-12-05 MED ORDER — OXYCODONE HCL 5 MG PO TABS: 5.0000 mg | ORAL_TABLET | Freq: Once | ORAL | Status: DC | PRN

## 2017-12-05 MED ORDER — PROPOFOL 10 MG/ML IV BOLUS
INTRAVENOUS | Status: DC | PRN
  Administered 2017-12-05: 200 mg via INTRAVENOUS

## 2017-12-05 MED ORDER — LACTATED RINGERS IV SOLN
INTRAVENOUS | Status: DC
  Administered 2017-12-05: 10:00:00 via INTRAVENOUS

## 2017-12-05 MED ORDER — OXYCODONE HCL 5 MG/5ML PO SOLN
5.0000 mg | Freq: Once | ORAL | Status: DC | PRN
  Filled 2017-12-05: qty 5

## 2017-12-05 MED ORDER — FENTANYL CITRATE (PF) 100 MCG/2ML IJ SOLN: 25.0000 ug | INTRAMUSCULAR | Status: DC | PRN

## 2017-12-05 MED ORDER — HYDROCODONE-ACETAMINOPHEN 10-325 MG PO TABS: 1.0000 | ORAL_TABLET | ORAL | 0 refills | Status: DC | PRN

## 2017-12-05 MED ORDER — STERILE WATER FOR IRRIGATION IR SOLN
Status: DC | PRN
  Administered 2017-12-05: 500 mL

## 2017-12-05 MED ORDER — ONDANSETRON HCL 4 MG/2ML IJ SOLN
INTRAMUSCULAR | Status: DC | PRN
  Administered 2017-12-05: 4 mg via INTRAVENOUS

## 2017-12-05 MED ORDER — EPHEDRINE SULFATE 50 MG/ML IJ SOLN
INTRAMUSCULAR | Status: DC | PRN
  Administered 2017-12-05: 10 mg via INTRAVENOUS

## 2017-12-05 MED ORDER — CIPROFLOXACIN IN D5W 400 MG/200ML IV SOLN
400.0000 mg | Freq: Once | INTRAVENOUS | Status: AC
  Administered 2017-12-05: 400 mg via INTRAVENOUS
  Filled 2017-12-05: qty 200

## 2017-12-05 MED ORDER — DEXAMETHASONE SODIUM PHOSPHATE 10 MG/ML IJ SOLN
INTRAMUSCULAR | Status: DC | PRN
  Administered 2017-12-05: 10 mg via INTRAVENOUS

## 2017-12-05 MED ORDER — PROPOFOL 10 MG/ML IV BOLUS
INTRAVENOUS | Status: AC
  Filled 2017-12-05: qty 20

## 2017-12-05 MED ORDER — SODIUM CHLORIDE 0.9 % IR SOLN
Status: DC | PRN
  Administered 2017-12-05: 3000 mL

## 2017-12-05 MED ORDER — ONDANSETRON HCL 4 MG/2ML IJ SOLN: 4.0000 mg | Freq: Four times a day (QID) | INTRAMUSCULAR | Status: DC | PRN

## 2017-12-05 MED ORDER — MIDAZOLAM HCL 2 MG/2ML IJ SOLN
INTRAMUSCULAR | Status: AC
  Filled 2017-12-05: qty 2

## 2017-12-05 MED ORDER — SODIUM CHLORIDE 0.9 % IR SOLN
Status: DC | PRN
  Administered 2017-12-05: 1000 mL

## 2017-12-05 MED ORDER — LIDOCAINE 2% (20 MG/ML) 5 ML SYRINGE
INTRAMUSCULAR | Status: DC | PRN
  Administered 2017-12-05: 60 mg via INTRAVENOUS

## 2017-12-05 MED ORDER — FENTANYL CITRATE (PF) 100 MCG/2ML IJ SOLN
INTRAMUSCULAR | Status: DC | PRN
  Administered 2017-12-05: 50 ug via INTRAVENOUS

## 2017-12-05 MED ORDER — FENTANYL CITRATE (PF) 100 MCG/2ML IJ SOLN
INTRAMUSCULAR | Status: AC
  Filled 2017-12-05: qty 2

## 2017-12-05 SURGICAL SUPPLY — 13 items
BAG URO CATCHER STRL LF (MISCELLANEOUS) ×2 IMPLANT
CLOTH BEACON ORANGE TIMEOUT ST (SAFETY) ×2 IMPLANT
EVACUATOR MICROVAS BLADDER (UROLOGICAL SUPPLIES) ×1 IMPLANT
FIBER LASER FLEXIVA 1000 (UROLOGICAL SUPPLIES) ×2 IMPLANT
GLOVE BIO SURGEON STRL SZ 6.5 (GLOVE) ×1 IMPLANT
GLOVE BIOGEL M 8.0 STRL (GLOVE) ×2 IMPLANT
GLOVE BIOGEL PI IND STRL 7.5 (GLOVE) IMPLANT
GLOVE BIOGEL PI INDICATOR 7.5 (GLOVE) ×1
GOWN STRL REUS W/TWL LRG LVL3 (GOWN DISPOSABLE) ×1 IMPLANT
GOWN STRL REUS W/TWL XL LVL3 (GOWN DISPOSABLE) ×3 IMPLANT
MANIFOLD NEPTUNE II (INSTRUMENTS) ×2 IMPLANT
PACK CYSTO (CUSTOM PROCEDURE TRAY) ×2 IMPLANT
TUBING CONNECTING 10 (TUBING) ×1 IMPLANT

## 2017-12-05 NOTE — Anesthesia Procedure Notes (Signed)
Date/Time: 12/05/2017 10:53 AM Performed by: Pilar Grammes, CRNA Pre-anesthesia Checklist: Patient identified Patient Re-evaluated:Patient Re-evaluated prior to induction Oxygen Delivery Method: Circle system utilized Preoxygenation: Pre-oxygenation with 100% oxygen Induction Type: IV induction Ventilation: Mask ventilation without difficulty LMA: LMA flexible inserted LMA Size: 5.0 Number of attempts: 1

## 2017-12-05 NOTE — Op Note (Signed)
PATIENT:  Norman [name]  PRE-OPERATIVE DIAGNOSIS: 1.  Meatal stenosis. 2.  Bladder calculus  POST-OPERATIVE DIAGNOSIS: Same  PROCEDURE: 1.  Urethral dilation. 2.  Cystolitholapaxy (1 cm)  SURGEON:  Claybon Jabs  INDICATION: Norman [name] is a 72 year old male who initially had a left ureteral stone found by CT scan that he eventually passed spontaneously into the bladder.  He failed however to pass the stone from his bladder despite observation for 4 months.  He therefore is brought to the operating room for treatment of the stone and its removal.  ANESTHESIA:  General  EBL:  Minimal  DRAINS: None  LOCAL MEDICATIONS USED:  None  SPECIMEN: Stone given to patient  Description of procedure: After informed consent the patient was taken to the operating room and placed on the table in a supine position. General anesthesia was then administered. Once fully anesthetized the patient was moved to the dorsal lithotomy position and the genitalia were sterilely prepped and draped in standard fashion. An official timeout was then performed.  Examination revealed his coronal hypospadias would not allow passage of the 23 French cystoscope so I dilated him with [employer] sounds from 12 [place] up to 24 [place].  I then was able to pass the 7 French cystoscope under direct vision with a 30 degree lens down the urethra which was noted to be entirely normal.  The prostatic urethra revealed some elongation and a median lobe component that likely was the cause of his inability to pass the stone spontaneously.  I used a 500 m holmium laser fiber to fragment the stone and then used the Microvasive evacuator to remove all stone fragments from the bladder.  Reinspection of the bladder with both the 30 and the 70 degree lens which was required to see over the median lobe revealed no further stone fragments present and no injury to the bladder mucosa.  I therefore drained the bladder, removed the cystoscope and the  patient was awakened and taken to the recovery room in stable and satisfactory condition.  He tolerated procedure well with no intraoperative complications.  PLAN OF CARE: Discharge to home after PACU  PATIENT DISPOSITION:  PACU - hemodynamically stable.

## 2017-12-05 NOTE — Anesthesia Preprocedure Evaluation (Signed)
Anesthesia Evaluation  Patient identified by MRN, date of birth, ID band Patient awake    Reviewed: Allergy & Precautions, H&P , NPO status , Patient's Chart, lab work & pertinent test results  Airway Mallampati: II   Neck ROM: full    Dental   Pulmonary    breath sounds clear to auscultation       Cardiovascular hypertension, + Peripheral Vascular Disease  + dysrhythmias Atrial Fibrillation  Rhythm:regular Rate:Normal     Neuro/Psych  Headaches, Anxiety TIACVA    GI/Hepatic   Endo/Other  diabetes, Type 2  Renal/GU      Musculoskeletal   Abdominal   Peds  Hematology   Anesthesia Other Findings   Reproductive/Obstetrics                             Anesthesia Physical Anesthesia Plan  ASA: III  Anesthesia Plan: General   Post-op Pain Management:    Induction: Intravenous  PONV Risk Score and Plan: 2 and Ondansetron and Dexamethasone  Airway Management Planned: LMA  Additional Equipment:   Intra-op Plan:   Post-operative Plan:   Informed Consent: I have reviewed the patients History and Physical, chart, labs and discussed the procedure including the risks, benefits and alternatives for the proposed anesthesia with the patient or authorized representative who has indicated his/her understanding and acceptance.     Plan Discussed with: CRNA, Anesthesiologist and Surgeon  Anesthesia Plan Comments:         Anesthesia Quick Evaluation

## 2017-12-05 NOTE — Transfer of Care (Signed)
Immediate Anesthesia Transfer of Care Note  Patient: Norman [name]  Procedure(s) Performed: CYSTOSCOPY WITH LITHOLAPAXY (N/A Bladder)  Patient Location: PACU  Anesthesia Type:General  Level of Consciousness: awake, alert , oriented and patient cooperative  Airway & Oxygen Therapy: Patient Spontanous Breathing and Patient connected to face mask oxygen  Post-op Assessment: Report given to RN, Post -op Vital signs reviewed and stable and Patient moving all extremities  Post vital signs: Reviewed and stable  Last Vitals:  Vitals Value Taken Time  BP 119/76 12/05/2017 11:27 AM  Temp 36.4 C 12/05/2017 11:27 AM  Pulse 73 12/05/2017 11:29 AM  Resp 19 12/05/2017 11:29 AM  SpO2 96 % 12/05/2017 11:29 AM  Vitals shown include unvalidated device data.  Last Pain:  Vitals:   12/05/17 0936  TempSrc: Oral  PainSc: 0-No pain         Complications: No apparent anesthesia complications

## 2017-12-08 ENCOUNTER — Encounter (HOSPITAL_COMMUNITY): Payer: Self-pay | Admitting: Urology

## 2017-12-08 NOTE — Anesthesia Postprocedure Evaluation (Signed)
Anesthesia Post Note  Patient: Norman [name]  Procedure(s) Performed: CYSTOSCOPY WITH LITHOLAPAXY (N/A Bladder)     Patient location during evaluation: PACU Anesthesia Type: General Level of consciousness: awake and alert Pain management: pain level controlled Vital Signs Assessment: post-procedure vital signs reviewed and stable Respiratory status: spontaneous breathing, nonlabored ventilation, respiratory function stable and patient connected to nasal cannula oxygen Cardiovascular status: blood pressure returned to baseline and stable Postop Assessment: no apparent nausea or vomiting Anesthetic complications: no    Last Vitals:  Vitals:   12/05/17 1200 12/05/17 1238  BP: 120/82 118/73  Pulse: 65 (!) 57  Resp: (!) 23 18  Temp: (!) 36.4 C (!) 36.4 C  SpO2: 93%     Last Pain:  Vitals:   12/05/17 1238  TempSrc: Oral  PainSc: 0-No pain                 Derrious Bologna S

## 2018-07-24 ENCOUNTER — Telehealth: Payer: Self-pay | Admitting: Cardiology

## 2018-07-24 NOTE — Telephone Encounter (Signed)
Per patient request he wanted to schedule his ppointment 11/09/08 @ 8:20

## 2018-11-09 ENCOUNTER — Other Ambulatory Visit: Payer: Self-pay | Admitting: *Deleted

## 2018-11-09 ENCOUNTER — Telehealth: Payer: Self-pay | Admitting: *Deleted

## 2018-11-09 NOTE — Telephone Encounter (Signed)

## 2018-11-09 NOTE — Progress Notes (Signed)
Virtual Visit via Telephone Note   This visit type was conducted due to national recommendations for restrictions regarding the COVID-19 Pandemic (e.g. social distancing) in an effort to limit this patient's exposure and mitigate transmission in our community.  Due to his co-morbid illnesses, this patient is at least at moderate risk for complications without adequate follow up.  This format is felt to be most appropriate for this patient at this time.  The patient did not have access to video technology/had technical difficulties with video requiring transitioning to audio format only (telephone).  All issues noted in this document were discussed and addressed.  No physical exam could be performed with this format.  Please refer to the patient's chart for his  consent to telehealth for [facility].   Evaluation Performed:  Follow-up visit  This visit type was conducted due to national recommendations for restrictions regarding the COVID-19 Pandemic (e.g. social distancing).  This format is felt to be most appropriate for this patient at this time.  All issues noted in this document were discussed and addressed.  No physical exam was performed (except for noted visual exam findings with Video Visits).  Please refer to the patient's chart (MyChart message for video visits and phone note for telephone visits) for the patient's consent to telehealth for [facility].  Date:  11/10/2018   ID:  Norman [name], DOB [birth year], MRN [id number]  Patient Location:  Home  Provider location:   [place]  PCP:  Antony Contras, MD  Cardiologist:  Fransico Him, MD  Electrophysiologist:  None   Chief Complaint:  Afib, CAD, HTN, HLD  History of Present Illness:    Norman [name] is a 73 y.o. male who presents via audio/video conferencing for a telehealth visit today.    Norman [name] is a 73 y.o. male with a hx of permanentatrial fibrillation, carotid artery stenosis,coronary artery calcifications by  CT,hyperlipidemia and dilated aortic root and HTN.  He is here today for followup and is doing well.  He denies any chest pain or pressure, PND, orthopnea, LE edema, palpitations or syncope. He has noticed that he does not have the energy he used to have.  He has noticed more DOE with less activity than he used to .  He also gets dizzy if he gets up too fast.  He is compliant with his meds and is tolerating meds with no SE.    The patient does not have symptoms concerning for COVID-19 infection (fever, chills, cough, or new shortness of breath).    Prior CV studies:   The following studies were reviewed today:  none  Past Medical History:  Diagnosis Date  . Anxiety   . Blood in urine 2 1/2 WEEKS AGO  . BPH (benign prostatic hypertrophy)    Mild  . Coronary artery calcification seen on CAT scan 2011   no ischemia on nuclear stress test 2011  . Dilated aortic root ([place])    29mm by echo 2017  . Dysrhythmia    afib  . Epididymal cyst 2005   Bilateral and small bilateral hydroceles with most recent ultra sound  . Glaucoma   . History of kidney stones   . Hypercholesteremia   . Hypertension   . Kidney stones    "I've passed them all"  . Migraine    "got off caffeine; they went away"  . Permanent atrial fibrillation ([place])   . Stroke [facility])    TIA ? - 2009   .  Tendinitis    History in Rotator cuff  . TIA (transient ischemic attack) ?12/28/2013  . Type II diabetes mellitus ([place])    Past Surgical History:  Procedure Laterality Date  . CARDIOVERSION  X 1  . CYSTOSCOPY WITH LITHOLAPAXY N/A 12/05/2017   Procedure: CYSTOSCOPY WITH LITHOLAPAXY;  Surgeon: Kathie Rhodes, MD;  [facility];  Service: Urology;  Laterality: N/A;  . CYSTOSCOPY WITH RETROGRADE PYELOGRAM, URETEROSCOPY AND STENT PLACEMENT Right 08/18/2015   Procedure: CYSTOSCOPY WITH RIGHT  RETROGRADE PYELOGRAM, RIGHT URETEROGRAM.;  Surgeon: Kathie Rhodes, MD;  [facility];  Service: Urology;  Laterality: Right;  .  TONSILLECTOMY       Current Meds  Medication Sig  . apixaban (ELIQUIS) 5 MG TABS tablet Take 1 tablet (5 mg total) by mouth 2 (two) times daily.  Marland Kitchen atorvastatin (LIPITOR) 80 MG tablet Take 80 mg by mouth every evening.   . COMBIGAN 0.2-0.5 % ophthalmic solution Place 1 drop into both eyes 2 (two) times daily.  Marland Kitchen diltiazem (TIAZAC) 240 MG 24 hr capsule Take 1 capsule by mouth daily.  Marland Kitchen LUMIGAN 0.01 % SOLN   . metFORMIN (GLUCOPHAGE) 500 MG tablet Take 500 mg by mouth daily with breakfast.  . metoprolol tartrate (LOPRESSOR) 25 MG tablet Take 25 mg by mouth 2 (two) times daily.  . valsartan (DIOVAN) 320 MG tablet Take 320 mg by mouth daily.  Marland Kitchen zolpidem (AMBIEN CR) 12.5 MG CR tablet Take 12.5 mg by mouth at bedtime.      Allergies:   Patient has no known allergies.   Social History   Tobacco Use  . Smoking status: Never Smoker  . Smokeless tobacco: Never Used  Substance Use Topics  . Alcohol use: Yes    Alcohol/week: 1.0 standard drinks    Types: 1 Glasses of wine per week    Comment: occ  . Drug use: No     Family Hx: The patient's family history includes Diabetes in his maternal aunt; Heart attack in his father; Kidney failure in his mother.  ROS:   Please see the history of present illness.     All other systems reviewed and are negative.   Labs/Other Tests and Data Reviewed:    Recent Labs: 11/21/2017: BUN 21; Creatinine, Ser 1.27; Hemoglobin 17.4; Platelets 241; Potassium 4.0; Sodium 137   Recent Lipid Panel Lab Results  Component Value Date/Time   CHOL 114 11/07/2017 08:59 AM   TRIG 98 11/07/2017 08:59 AM   HDL 33 (L) 11/07/2017 08:59 AM   CHOLHDL 3.5 11/07/2017 08:59 AM   CHOLHDL 3.6 12/29/2013 04:00 AM   LDLCALC 61 11/07/2017 08:59 AM    Wt Readings from Last 3 Encounters:  11/10/18 230 lb (104.3 kg)  12/05/17 235 lb 0.2 oz (106.6 kg)  11/21/17 235 lb (106.6 kg)     Objective:    Vital Signs:  BP 120/84   Pulse 69   Ht 6' (1.829 m)   Wt 230 lb  (104.3 kg)   BMI 31.19 kg/m     ASSESSMENT & PLAN:    1.  Permanent atrial fibrillation - his HR is well controlled.  He has not had any bleeding problems on apixaban.  He will continue on apixaban 5mg  BID, Diltiazem 240mg  daily and Lopressor 25mg  BID.   2.  Hypertension - his BP is controlled at home. He will continue on Diltiazem 240mg  daily, Lopressor 25mg  BID and Valsartan 320mg  daily.    3.  Dilated ascending aorta - this was normal  on last echo 2017  4.  Coronary artery calcifications - this was noted on prior Chest CT.  He has noticed that he gets winded much more than he used to with activities like he used to be able to do. He has put on 10lbs since the COVID crisis which could account for his new DOE.  I am going to get a 2D echo to assess LVF and Lexiscan myoview to rule out ischemia.  He will continue on statin.  5.  Type 2 Dm - this is followed by his PCP.  His last HbA1C was 6.2 a year ago.  He will continue on Metformin 500mg  daily.   6.  Hyperlipidemia - his LDL goal is < 70 due to DM and dilated aorta.  He will continue on atorvastatin 80mg  daily.  I will get the results of his recent ALT and lipids from PCP.  COVID-19 Education: The signs and symptoms of COVID-19 were discussed with the patient and how to seek care for testing (follow up with PCP or arrange E-visit).  The importance of social distancing was discussed today.  Patient Risk:   After full review of this patient's clinical status, I feel that they are at least moderate risk at this time.  Time:   Today, I have spent 20 minutes directly with the patient on telemedicine discussing medical problems including HLD, HTN, afib, coronary Ca++..  We also reviewed the symptoms of COVID 19 and the ways to protect against contracting the virus with telehealth technology.  I spent an additional 5 minutes reviewing patient's chart including LABS.  Medication Adjustments/Labs and Tests Ordered: Current medicines are  reviewed at length with the patient today.  Concerns regarding medicines are outlined above.  Tests Ordered: No orders of the defined types were placed in this encounter.  Medication Changes: No orders of the defined types were placed in this encounter.   Disposition:  Follow up in 6 month(s) virtual visit  Signed, Fransico Him, MD  11/10/2018 8:33 AM    [place]

## 2018-11-10 ENCOUNTER — Other Ambulatory Visit: Payer: Self-pay

## 2018-11-10 ENCOUNTER — Telehealth (INDEPENDENT_AMBULATORY_CARE_PROVIDER_SITE_OTHER): Payer: Medicare Other | Admitting: Cardiology

## 2018-11-10 VITALS — BP 120/84 | HR 69 | Ht 72.0 in | Wt 230.0 lb

## 2018-11-10 DIAGNOSIS — I2510 Atherosclerotic heart disease of native coronary artery without angina pectoris: Secondary | ICD-10-CM | POA: Diagnosis not present

## 2018-11-10 DIAGNOSIS — I4821 Permanent atrial fibrillation: Secondary | ICD-10-CM

## 2018-11-10 DIAGNOSIS — I77810 Thoracic aortic ectasia: Secondary | ICD-10-CM

## 2018-11-10 DIAGNOSIS — R0602 Shortness of breath: Secondary | ICD-10-CM

## 2018-11-10 DIAGNOSIS — E7800 Pure hypercholesterolemia, unspecified: Secondary | ICD-10-CM

## 2018-11-10 DIAGNOSIS — E1159 Type 2 diabetes mellitus with other circulatory complications: Secondary | ICD-10-CM

## 2018-11-10 DIAGNOSIS — I10 Essential (primary) hypertension: Secondary | ICD-10-CM | POA: Diagnosis not present

## 2018-11-10 MED ORDER — EZETIMIBE 10 MG PO TABS: 10.0000 mg | ORAL_TABLET | Freq: Every day | ORAL | 3 refills | Status: DC

## 2018-11-10 NOTE — Patient Instructions (Addendum)
Spoke with pt regarding ordered tests. Scheduling will call and arrange with pt. Pt instructed to call back with any questions.  Medication Instructions:  Your physician recommends that you continue on your current medications as directed. Please refer to the Current Medication list given to you today.  Labwork: Your lab work has been requested from your primary care physician office.   Testing/Procedures: Your physician has requested that you have a lexiscan myoview. For further information please visit [email or web address]. Please follow instruction sheet, as given.  Your physician has requested that you have an echocardiogram. Echocardiography is a painless test that uses sound waves to create images of your heart. It provides your doctor with information about the size and shape of your heart and how well your heart's chambers and valves are working. This procedure takes approximately one hour. There are no restrictions for this procedure.   Follow-Up: Your physician recommends that you schedule a follow-up appointment in: 6 months with Dr. Radford Pax for a virtual visit.   Any Other Special Instructions Will Be Listed Below (If Applicable).     If you need a refill on your cardiac medications before your next appointment, please call your pharmacy.

## 2018-11-16 ENCOUNTER — Telehealth (HOSPITAL_COMMUNITY): Payer: Self-pay

## 2018-11-16 NOTE — Telephone Encounter (Signed)
Spoke with the patient concerning his stress test and his echocardiogram. He stated that he understood and would be here for his test on Thursday. Asked to call back with any questions. S.Elisah Parmer EMTP

## 2018-11-19 ENCOUNTER — Other Ambulatory Visit: Payer: Self-pay

## 2018-11-19 ENCOUNTER — Ambulatory Visit (HOSPITAL_COMMUNITY): Payer: Medicare Other | Attending: Cardiology

## 2018-11-19 ENCOUNTER — Telehealth: Payer: Self-pay

## 2018-11-19 ENCOUNTER — Ambulatory Visit (HOSPITAL_BASED_OUTPATIENT_CLINIC_OR_DEPARTMENT_OTHER): Payer: Medicare Other

## 2018-11-19 DIAGNOSIS — R0602 Shortness of breath: Secondary | ICD-10-CM

## 2018-11-19 LAB — MYOCARDIAL PERFUSION IMAGING
LV dias vol: 71 mL (ref 62–150)
LV sys vol: 27 mL
Peak HR: 62 {beats}/min
Rest HR: 49 {beats}/min
SDS: 0
SRS: 0
SSS: 0
TID: 0.98

## 2018-11-19 MED ORDER — REGADENOSON 0.4 MG/5ML IV SOLN
0.4000 mg | Freq: Once | INTRAVENOUS | Status: AC
  Administered 2018-11-19: 0.4 mg via INTRAVENOUS

## 2018-11-19 MED ORDER — TECHNETIUM TC 99M TETROFOSMIN IV KIT
10.1000 | PACK | Freq: Once | INTRAVENOUS | Status: AC | PRN
  Administered 2018-11-19: 10.1 via INTRAVENOUS
  Filled 2018-11-19: qty 11

## 2018-11-19 MED ORDER — TECHNETIUM TC 99M TETROFOSMIN IV KIT
31.6000 | PACK | Freq: Once | INTRAVENOUS | Status: AC | PRN
  Administered 2018-11-19: 31.6 via INTRAVENOUS
  Filled 2018-11-19: qty 32

## 2018-11-19 NOTE — Telephone Encounter (Signed)
Notes recorded by Frederik Schmidt, RN on 11/19/2018 at 1:59 PM EDT  The patient has been notified of the result and verbalized understanding. All questions (if any) were answered.  Frederik Schmidt, RN 11/19/2018 1:59 PM

## 2018-11-19 NOTE — Telephone Encounter (Signed)
-----   Message from Sueanne Margarita, MD sent at 11/19/2018  1:49 PM EDT ----- Please let patient know that echo showed normal LVF with mildly thickened heart muscle, mildly enlarged LA and mildly thickened AV

## 2018-11-20 ENCOUNTER — Telehealth: Payer: Self-pay

## 2018-11-20 NOTE — Telephone Encounter (Signed)
-----   Message from Sueanne Margarita, MD sent at 11/19/2018  8:21 PM EDT ----- Please let patient know that stress test was fine

## 2018-11-20 NOTE — Telephone Encounter (Signed)
Notes recorded by Frederik Schmidt, RN on 11/20/2018 at 8:25 AM EDT  Lpm with stress test results. 8/28  ------

## 2018-12-25 ENCOUNTER — Other Ambulatory Visit: Payer: Self-pay | Admitting: Cardiology

## 2018-12-25 NOTE — Telephone Encounter (Signed)
Eliquis 5mg  refill request received; pt is 73yrs old, weight-106.6kg, Crea-1.33 on 08/13/2018 via scanned labs from [place] PCP, Diagnosis-Afib, and last seen by Dr. Radford Pax on 11/10/2018. Dose is appropriate based on dosing criteria. Will send in refill to requested pharmacy.

## 2019-01-06 ENCOUNTER — Other Ambulatory Visit: Payer: Medicare Other | Admitting: *Deleted

## 2019-01-06 ENCOUNTER — Encounter (INDEPENDENT_AMBULATORY_CARE_PROVIDER_SITE_OTHER): Payer: Self-pay

## 2019-01-06 ENCOUNTER — Other Ambulatory Visit: Payer: Self-pay

## 2019-01-06 DIAGNOSIS — E7800 Pure hypercholesterolemia, unspecified: Secondary | ICD-10-CM

## 2019-01-06 LAB — LIPID PANEL
Chol/HDL Ratio: 3.3 ratio (ref 0.0–5.0)
Cholesterol, Total: 104 mg/dL (ref 100–199)
HDL: 32 mg/dL — ABNORMAL LOW (ref >39)
LDL Chol Calc (NIH): 55 mg/dL (ref 0–99)
Triglycerides: 83 mg/dL (ref 0–149)
VLDL Cholesterol Cal: 17 mg/dL (ref 5–40)

## 2019-01-06 LAB — ALT: ALT: 28 IU/L (ref 0–44)

## 2019-02-17 ENCOUNTER — Other Ambulatory Visit: Payer: Self-pay | Admitting: Family Medicine

## 2019-02-17 DIAGNOSIS — Z13820 Encounter for screening for osteoporosis: Secondary | ICD-10-CM

## 2019-03-05 ENCOUNTER — Other Ambulatory Visit: Payer: Self-pay

## 2019-03-05 ENCOUNTER — Ambulatory Visit
Admission: RE | Admit: 2019-03-05 | Discharge: 2019-03-05 | Disposition: A | Discharge status: Home / Self Care | Payer: Medicare Other | Source: Ambulatory Visit | Attending: Family Medicine | Admitting: Family Medicine

## 2019-03-05 DIAGNOSIS — Z13820 Encounter for screening for osteoporosis: Secondary | ICD-10-CM

## 2019-04-23 ENCOUNTER — Ambulatory Visit: Payer: Medicare Other

## 2019-05-01 ENCOUNTER — Ambulatory Visit: Payer: Medicare PPO | Attending: Internal Medicine

## 2019-05-01 DIAGNOSIS — Z23 Encounter for immunization: Secondary | ICD-10-CM | POA: Insufficient documentation

## 2019-05-01 NOTE — Progress Notes (Signed)
   Covid-19 Vaccination Clinic  Name:  Norman [name]    MRN: [id number] DOB: March 07, 1946  05/01/2019  Norman [name] was observed post Covid-19 immunization for 15 minutes without incidence. He was provided with Vaccine Information Sheet and instruction to access the V-Safe system.   Norman [name] was instructed to call 911 with any severe reactions post vaccine: Marland Kitchen Difficulty breathing  . Swelling of your face and throat  . A fast heartbeat  . A bad rash all over your body  . Dizziness and weakness    Immunizations Administered    Name Date Dose VIS Date Route   Pfizer COVID-19 Vaccine 05/01/2019  4:46 PM 0.3 mL 03/05/2019 Intramuscular   Manufacturer: [place]   Lot: [id number]   [place]: [id number]

## 2019-05-26 ENCOUNTER — Ambulatory Visit: Payer: Medicare PPO | Attending: Internal Medicine

## 2019-05-26 DIAGNOSIS — Z23 Encounter for immunization: Secondary | ICD-10-CM | POA: Insufficient documentation

## 2019-05-26 NOTE — Progress Notes (Signed)
   Covid-19 Vaccination Clinic  Name:  Norman [name]    MRN: [id number] DOB: 1945/05/29  05/26/2019  [name] was observed post Covid-19 immunization for 15 minutes without incident. He was provided with Vaccine Information Sheet and instruction to access the V-Safe system.   [name] was instructed to call 911 with any severe reactions post vaccine: Marland Kitchen Difficulty breathing  . Swelling of face and throat  . A fast heartbeat  . A bad rash all over body  . Dizziness and weakness   Immunizations Administered    Name Date Dose VIS Date Route   Pfizer COVID-19 Vaccine 05/26/2019  1:20 PM 0.3 mL 03/05/2019 Intramuscular   Manufacturer: [place]   Lot: [id number]   [place]: [id number]

## 2019-08-10 ENCOUNTER — Ambulatory Visit: Payer: Medicare PPO

## 2019-08-11 ENCOUNTER — Other Ambulatory Visit: Payer: Self-pay

## 2019-08-11 ENCOUNTER — Ambulatory Visit: Payer: Medicare PPO | Attending: Family Medicine | Admitting: Physical Therapy

## 2019-08-11 DIAGNOSIS — G8929 Other chronic pain: Secondary | ICD-10-CM | POA: Diagnosis present

## 2019-08-11 DIAGNOSIS — M545 Low back pain, unspecified: Secondary | ICD-10-CM

## 2019-08-11 DIAGNOSIS — M6281 Muscle weakness (generalized): Secondary | ICD-10-CM | POA: Insufficient documentation

## 2019-08-11 NOTE — Therapy (Signed)
[facility] [address] [place], [place], [address] Phone: [phone]   Fax:  [phone]  Physical Therapy Evaluation  Patient Details  Name: Norman [name] MRN: [id number] Date of Birth: 1945-05-30 Referring Provider (PT): Antony Contras, MD   Encounter Date: 08/11/2019  PT End of Session - 08/11/19 1118    Visit Number  1    Number of Visits  7    Date for PT Re-Evaluation  10/10/19   written for 30 day POC   Authorization Type  [employer]    PT Start Time  1024    PT Stop Time  1105    PT Time Calculation (min)  41 min    Activity Tolerance  Patient tolerated treatment well    Behavior During Therapy  [facility] for tasks assessed/performed       Past Medical History:  Diagnosis Date  . Anxiety   . Blood in urine 2 1/2 WEEKS AGO  . BPH (benign prostatic hypertrophy)    Mild  . Coronary artery calcification seen on CAT scan 2011   no ischemia on nuclear stress test 2011  . Dilated aortic root ([place])    62mm by echo 2017  . Dysrhythmia    afib  . Epididymal cyst 2005   Bilateral and small bilateral hydroceles with most recent ultra sound  . Glaucoma   . History of kidney stones   . Hypercholesteremia   . Hypertension   . Kidney stones    "I've passed them all"  . Migraine    "got off caffeine; they went away"  . Permanent atrial fibrillation ([place])   . Stroke [facility])    TIA ? - 2009   . Tendinitis    History in Rotator cuff  . TIA (transient ischemic attack) ?12/28/2013  . Type II diabetes mellitus ([place])     Past Surgical History:  Procedure Laterality Date  . CARDIOVERSION  X 1  . CYSTOSCOPY WITH LITHOLAPAXY N/A 12/05/2017   Procedure: CYSTOSCOPY WITH LITHOLAPAXY;  Surgeon: Kathie Rhodes, MD;  [facility];  Service: Urology;  Laterality: N/A;  . CYSTOSCOPY WITH RETROGRADE PYELOGRAM, URETEROSCOPY AND STENT PLACEMENT Right 08/18/2015   Procedure: CYSTOSCOPY WITH RIGHT  RETROGRADE  PYELOGRAM, RIGHT URETEROGRAM.;  Surgeon: Kathie Rhodes, MD;  [facility];  Service: Urology;  Laterality: Right;  . TONSILLECTOMY      There were no vitals filed for this visit.   Subjective Assessment - 08/11/19 1026    Subjective  Low back pain started about 7 months ago - about 5 weeks prior was doing light work and thinks that what was brought it on. Had increased pain when standing, and over the months he doesn't have as much pain  - just notices fatigue. When pt wakes up in the morning, he reports the back feels aching and like he is crippled. This also started about 7 months ago. Notices that when he moves throughout the day, the pain subsides. Can go away in 1 or 2 hours or can linger until lunch time. Also has relief of low back pain when sitting at a 45 degree angle. Pain stays in B low back. Not taking any medication for pain. Tried heat - it feels good,  but it doesn't relieve the pain.    Pertinent History  type 2 diabetes, HLD, HTN, a fib, stage 3 CKD, CVA 12/2013, R ankle arthritis    How long can you stand comfortably?  can stand without  looking for a seat about 5 minutes    How long can you walk comfortably?  30 minutes - 1 hour    Patient Stated Goals  wants to try to get as much relief from pain, wants to learn to do exercises at home.    Currently in Pain?  No/denies         [facility] PT Assessment - 08/11/19 0001      Assessment   Medical Diagnosis  low back pain    Referring Provider (PT)  Antony Contras, MD    Onset Date/Surgical Date  01/11/19   approx.   Prior Therapy  previous PT at this location for balance      Balance Screen   Has the patient fallen in the past 6 months  No    Has the patient had a decrease in activity level because of a fear of falling?   No    Is the patient reluctant to leave their home because of a fear of falling?   No      Home Environment   Living Environment  Private residence    Living Arrangements  Spouse/significant other     Type of [place] to enter    Entrance Stairs-Number of Steps  1    Entrance Stairs-Rails  None    Home Layout  One level    [place]ard bars - toilet;Grab bars - tub/shower      Prior Function   Level of Independence  Independent    Vocation  Retired    Buyer, retail for the [employer]  being with grandkids       Media planner Intact      ROM / Strength   AROM / PROM / Strength  Strength;AROM      AROM   Overall AROM Comments  in supine: R and L hip flexion AROM, WFL tightness at end range with pt reporting incr discomfort in low back and a stretch. Hip ER WFL, limited in IR B - no increase in pain. B hamstring limited to approx. 45 deg with pt reporting incr tightness, but no pain     AROM Assessment Site  Lumbar    Lumbar Flexion  WFL, can touch toes with knees bent    back feels sore   Lumbar Extension  WFL    Lumbar - Right Side Bend  25% limited, discomfort    bends ipsilateral knee when performing    Lumbar - Left Side Bend  25% limited, discomfort   bends ipsilateral knee when performing    Lumbar - Right Rotation  50% limited    back feels sore   Lumbar - Left Rotation  WFL   back feels sore     Strength   Strength Assessment Site  Hip;Ankle;Knee    Right/Left Hip  Right;Left    Right Hip Flexion  4/5    Right Hip Extension  4/5    Left Hip Flexion  4/5    Left Hip Extension  3+/5    Left Hip ABduction  4/5    Left Hip ADduction  4+/5    Right/Left Knee  Right;Left    Right Knee Flexion  4+/5    Right Knee Extension  4+/5    Left Knee Flexion  4+/5    Left Knee Extension  4+/5    Right/Left  Ankle  Right;Left    Right Ankle Dorsiflexion  5/5    Right Ankle Plantar Flexion  3-/5    Left Ankle Dorsiflexion  5/5    Left Ankle Plantar Flexion  3+/5      Palpation   Spinal mobility  hypomobility central T12-L5, symptoms not reproduced       Special Tests    Special  Tests  Lumbar    Lumbar Tests  Slump Test;Prone Knee Bend Test;Straight Leg Raise;FABER test      FABER test   findings  Negative    Comment  tightness      Slump test   Findings  Negative    Comment  B      Prone Knee Bend Test   Findings  Negative    Comment  tightness B      Straight Leg Raise   Findings  Negative    Comment  tightness B      Transfers   Comments  pt reporting that laying supine with legs extended on his back while watching TV at night for 15 minutes incr low back pain, pain subsides when pt lays on his side. discussed with pt placing pillow between knees while sleeping to see if it will decr pain when pt wakes up for better alignment when pt is sleeping      Ambulation/Gait   Ambulation/Gait  Yes    Ambulation/Gait Assistance  7: Independent    Gait Pattern  Step-through pattern;Within Functional Limits;Decreased stance time - right    Ambulation Surface  Level;Indoor        Outcome Measure: Oswestry Disability Index = 24%           Objective measurements completed on examination: See above findings.              PT Education - 08/11/19 1118    Education Details  clinical findings, POC, sleeping with pillow in between legs when sidelying    Person(s) Educated  Patient    Methods  Explanation    Comprehension  Verbalized understanding       PT Short Term Goals - 08/11/19 1120      PT SHORT TERM GOAL #1   Title  ALL STGS = LTGS        PT Long Term Goals - 08/11/19 1120      PT LONG TERM GOAL #1   Title  Pt will be independent with HEP for strengthening, ROM, and general mobility. ALL LTGS DUE 09/09/19    Time  4    Period  Weeks    Status  New    Target Date  09/09/19      PT LONG TERM GOAL #2   Title  Pt will be able to stand >10 minutes before needing to sit down to find a chair in order to improve tolerance for IADLs.    Time  4    Period  Weeks    Status  New      PT LONG TERM GOAL #3   Title  Pt will decr ODI  score to 14% or less in order to demo decr disability.    Baseline  24% on 08/11/19    Time  4    Period  Weeks    Status  New      PT LONG TERM GOAL #4   Title  The patient will verbalize understanding of community wellness for post d/c.    Time  4  Period  Weeks    Status  New             Plan - 08/11/19 1132    Clinical Impression Statement  Patient is a 74 year old male referred to Neuro OPPT for evaluation for chronic low back pain that started approx. 7 months ago. Pt with increased pain after standing >10 minutes and after waking up in the morning. Pt's pain in the morning gets better with movement and pt's pain is relieved after standing when sitting down at a 45 degree angle.  Pt's PMH is significant for: type 2 diabetes, HLD, HTN, a fib, stage 3 CKD, CVA 12/2013, R ankle arthritis . The following deficits were present during the exam: decreased lumbar and hip AROM, lumbar spine hypomobility, decreased BLE strength (more notably proximal hip musculature), low back pain.  Pt would benefit from skilled PT to address these impairments and functional limitations to maximize functional mobility independence.    Personal Factors and Comorbidities  Comorbidity 3+;Time since onset of injury/illness/exacerbation;Past/Current Experience    Comorbidities  type 2 diabetes, HLD, HTN, a fib, stage 3 CKD, CVA 12/2013, R ankle arthritis    Examination-Activity Limitations  Locomotion Level;Stand;Sleep    Examination-Participation Restrictions  Community Activity   playing with grandkids   Stability/Clinical Decision Making  Stable/Uncomplicated    Clinical Decision Making  Low    Rehab Potential  Excellent    PT Frequency  2x / week   followed by 1x week for 2 weeks   PT Duration  2 weeks    PT Treatment/Interventions  ADLs/Self Care Home Management;Therapeutic activities;Functional mobility training;Therapeutic exercise;Neuromuscular re-education;Balance training;Patient/family  education;Manual techniques    PT Next Visit Plan  initial HEP - low back, hamstring, and hip IR stretching, general low back mobility, strengthening for hip flexors/ext and ABD. how did sleeping with pillow between knees feel?    Consulted and Agree with Plan of Care  Patient       Patient will benefit from skilled therapeutic intervention in order to improve the following deficits and impairments:  Abnormal gait, Decreased mobility, Decreased range of motion, Decreased strength, Hypomobility, Impaired flexibility, Pain, Improper body mechanics, Decreased activity tolerance  Visit Diagnosis: Muscle weakness (generalized)  Chronic midline low back pain without sciatica     Problem List Patient Active Problem List   Diagnosis Date Noted  . Dilated aortic root ([place]) 10/13/2014  . Permanent atrial fibrillation ([place])   . Type 2 diabetes mellitus with other circulatory complications ([place]) [id number]  . HLD (hyperlipidemia) 06/14/2014  . Polycythemia 03/11/2014  . Cerebral infarction due to unspecified mechanism 03/11/2014  . Chronic anticoagulation 03/11/2014  . CVA (cerebral infarction) 12/29/2013  . Encounter for therapeutic drug monitoring 06/25/2013  . Essential hypertension, benign 04/05/2013  . Coronary artery calcification seen on CAT scan 03/25/2009    Arliss Journey , PT, DPT  08/11/2019, 11:41 AM  [facility] [address] [place], [place], [address] Phone: [phone]   Fax:  [phone]  Name: Norman [name] MRN: [id number] Date of Birth: 1945-09-22

## 2019-08-17 ENCOUNTER — Ambulatory Visit: Payer: Medicare PPO

## 2019-08-17 ENCOUNTER — Other Ambulatory Visit: Payer: Self-pay

## 2019-08-17 DIAGNOSIS — M6281 Muscle weakness (generalized): Secondary | ICD-10-CM | POA: Diagnosis not present

## 2019-08-17 DIAGNOSIS — G8929 Other chronic pain: Secondary | ICD-10-CM

## 2019-08-17 NOTE — Therapy (Signed)
[facility] [address] [place], [place], [address] Phone: [phone]   Fax:  [phone]  Physical Therapy Treatment  Patient Details  Name: Norman [name] MRN: [id number] Date of Birth: 15-Mar-1946 Referring Provider (PT): Antony Contras, MD   Encounter Date: 08/17/2019  PT End of Session - 08/17/19 1011    Visit Number  2    Number of Visits  7    Date for PT Re-Evaluation  10/10/19    Authorization Type  [employer]    PT Start Time  0930    PT Stop Time  1015    PT Time Calculation (min)  45 min    Activity Tolerance  Patient tolerated treatment well    Behavior During Therapy  [facility] for tasks assessed/performed       Past Medical History:  Diagnosis Date  . Anxiety   . Blood in urine 2 1/2 WEEKS AGO  . BPH (benign prostatic hypertrophy)    Mild  . Coronary artery calcification seen on CAT scan 2011   no ischemia on nuclear stress test 2011  . Dilated aortic root ([place])    60mm by echo 2017  . Dysrhythmia    afib  . Epididymal cyst 2005   Bilateral and small bilateral hydroceles with most recent ultra sound  . Glaucoma   . History of kidney stones   . Hypercholesteremia   . Hypertension   . Kidney stones    "I've passed them all"  . Migraine    "got off caffeine; they went away"  . Permanent atrial fibrillation ([place])   . Stroke [facility])    TIA ? - 2009   . Tendinitis    History in Rotator cuff  . TIA (transient ischemic attack) ?12/28/2013  . Type II diabetes mellitus ([place])     Past Surgical History:  Procedure Laterality Date  . CARDIOVERSION  X 1  . CYSTOSCOPY WITH LITHOLAPAXY N/A 12/05/2017   Procedure: CYSTOSCOPY WITH LITHOLAPAXY;  Surgeon: Kathie Rhodes, MD;  Location: [facility];  Service: Urology;  Laterality: N/A;  . CYSTOSCOPY WITH RETROGRADE PYELOGRAM, URETEROSCOPY AND STENT PLACEMENT Right 08/18/2015   Procedure: CYSTOSCOPY WITH RIGHT  RETROGRADE PYELOGRAM, RIGHT URETEROGRAM.;   Surgeon: Kathie Rhodes, MD;  Location: [facility];  Service: Urology;  Laterality: Right;  . TONSILLECTOMY      There were no vitals filed for this visit.  Subjective Assessment - 08/17/19 0934    Subjective  Pain is easing off since the morning. Pain when he got out of truck was 2/10. Pain in the morning was 4/10. Currently 0/10. If lying in supine, after 20 min when he gets up he will feel pain.              Lumbar ROM Flexion 100% Extension 10% lateral flexion 30% bil Rotation: WNL  Manual therapy: STM to bil quadratus lumborum, lumbar praspainlis and multifidus  TherE: Lower trunk rotations: 15x Knee to chest: 10 x 5" holds Standing lumbar extensions with back against countertop: 10x    Access Code: [email or web address] URL: [email or web address] Date: 08/17/2019 Prepared by: Markus Jarvis  Exercises Supine Lower Trunk Rotation - 2 x daily - 7 x weekly - 1 sets - 10 reps Hooklying Single Knee to Chest Stretch - 2 x daily - 7 x weekly - 1 sets - 10 reps - 5 hold Standing Lumbar Extension with Counter - 2 x daily - 7 x weekly - 1 sets - 10 reps  PT Short Term Goals - 08/17/19 1013      PT SHORT TERM GOAL #1   Title  ALL STGS = LTGS        PT Long Term Goals - 08/17/19 1013      PT LONG TERM GOAL #1   Title  Pt will be independent with HEP for strengthening, ROM, and general mobility. ALL LTGS DUE 09/09/19    Time  4    Period  Weeks    Status  New      PT LONG TERM GOAL #2   Title  Pt will be able to stand >10 minutes before needing to sit down to find a chair in order to improve tolerance for IADLs.    Time  4    Period  Weeks    Status  New      PT LONG TERM GOAL #3   Title  Pt will decr ODI score to 14% or less in order to demo decr disability.    Baseline  24% on 08/11/19    Time  4    Period  Weeks    Status  New      PT LONG TERM GOAL #4   Title  The patient will verbalize understanding of community wellness  for post d/c.    Time  4    Period  Weeks    Status  New            Plan - 08/17/19 1010    Clinical Impression Statement  Pt demo most restriction with lumbar extension. Pt had increased mm tone in L quadratus lumborum and multifidus compared to R. Patient demo improved lumbar extension from 10% to 20% and lumbar lateral flexion form 30% to 50% bil at end of the session.    Personal Factors and Comorbidities  Comorbidity 3+;Time since onset of injury/illness/exacerbation;Past/Current Experience    Comorbidities  type 2 diabetes, HLD, HTN, a fib, stage 3 CKD, CVA 12/2013, R ankle arthritis    Examination-Activity Limitations  Locomotion Level;Stand;Sleep    Examination-Participation Restrictions  Community Activity   playing with grandkids   Stability/Clinical Decision Making  Stable/Uncomplicated    Clinical Decision Making  Low    Rehab Potential  Excellent    PT Frequency  2x / week   followed by 1x week for 2 weeks   PT Duration  2 weeks    PT Treatment/Interventions  ADLs/Self Care Home Management;Therapeutic activities;Functional mobility training;Therapeutic exercise;Neuromuscular re-education;Balance training;Patient/family education;Manual techniques    PT Next Visit Plan  initial HEP - low back, hamstring, and hip IR stretching, general low back mobility, strengthening for hip flexors/ext and ABD. how did sleeping with pillow between knees feel?    PT Home Exercise Plan  Access Code: [email or web address]: [email or web address]: 05/25/2021Prepared by: Gwenyth Bouillon PatelExercisesSupine Lower Trunk Rotation - 2 x daily - 7 x weekly - 1 sets - 10 repsHooklying Single Knee to Chest Stretch - 2 x daily - 7 x weekly - 1 sets - 10 reps - 5 holdStanding Lumbar Extension with Counter - 2 x daily - 7 x weekly - 1 sets - 10 reps    Consulted and Agree with Plan of Care  Patient       Patient will benefit from skilled therapeutic intervention in order to improve the following  deficits and impairments:  Abnormal gait, Decreased mobility, Decreased range of motion, Decreased strength, Hypomobility, Impaired flexibility, Pain, Improper body mechanics, Decreased activity tolerance  Visit Diagnosis: Muscle weakness (generalized)  Chronic midline low back pain without sciatica     Problem List Patient Active Problem List   Diagnosis Date Noted  . Dilated aortic root ([place]) 10/13/2014  . Permanent atrial fibrillation ([place])   . Type 2 diabetes mellitus with other circulatory complications ([place]) [id number]  . HLD (hyperlipidemia) 06/14/2014  . Polycythemia 03/11/2014  . Cerebral infarction due to unspecified mechanism 03/11/2014  . Chronic anticoagulation 03/11/2014  . CVA (cerebral infarction) 12/29/2013  . Encounter for therapeutic drug monitoring 06/25/2013  . Essential hypertension, benign 04/05/2013  . Coronary artery calcification seen on CAT scan 03/25/2009    Kerrie Pleasure, PT 08/17/2019, 10:14 AM  [facility] [address] [place], [place], [address] Phone: [phone]   Fax:  [phone]  Name: Norman [name] MRN: [id number] Date of Birth: February 12, 1946

## 2019-08-19 ENCOUNTER — Other Ambulatory Visit: Payer: Self-pay

## 2019-08-19 ENCOUNTER — Ambulatory Visit: Payer: Medicare PPO | Admitting: Physical Therapy

## 2019-08-19 VITALS — BP 136/79 | HR 84

## 2019-08-19 DIAGNOSIS — M545 Low back pain, unspecified: Secondary | ICD-10-CM

## 2019-08-19 DIAGNOSIS — M6281 Muscle weakness (generalized): Secondary | ICD-10-CM | POA: Diagnosis not present

## 2019-08-19 NOTE — Patient Instructions (Signed)
Access Code: [email or web address] URL: [email or web address] Date: 08/19/2019 Prepared by: Janann August  Exercises Supine Lower Trunk Rotation - 2 x daily - 7 x weekly - 1 sets - 10 reps Hooklying Single Knee to Chest Stretch - 2 x daily - 7 x weekly - 1 sets - 10 reps - 5 hold Standing Lumbar Extension with Counter - 2 x daily - 7 x weekly - 1 sets - 10 reps Seated Hamstring Stretch - 1-2 x daily - 7 x weekly - 3 sets - 30 reps Supine Transversus Abdominis Bracing - Hands on Stomach - 2 x daily - 7 x weekly - 1 sets - 10 reps

## 2019-08-19 NOTE — Therapy (Signed)
[facility] [address] [name], [place], [address] Phone: [phone]   Fax:  [phone]  Physical Therapy Treatment  Patient Details  Name: Norman [name] MRN: [id number] Date of Birth: Apr 15, 1945 Referring Provider (PT): Antony Contras, MD   Encounter Date: 08/19/2019  PT End of Session - 08/19/19 1027    Visit Number  3    Number of Visits  7    Date for PT Re-Evaluation  10/10/19    Authorization Type  [employer]    PT Start Time  0931    PT Stop Time  1015    PT Time Calculation (min)  44 min    Activity Tolerance  Patient tolerated treatment well    Behavior During Therapy  [facility] for tasks assessed/performed       Past Medical History:  Diagnosis Date  . Anxiety   . Blood in urine 2 1/2 WEEKS AGO  . BPH (benign prostatic hypertrophy)    Mild  . Coronary artery calcification seen on CAT scan 2011   no ischemia on nuclear stress test 2011  . Dilated aortic root ([place])    9mm by echo 2017  . Dysrhythmia    afib  . Epididymal cyst 2005   Bilateral and small bilateral hydroceles with most recent ultra sound  . Glaucoma   . History of kidney stones   . Hypercholesteremia   . Hypertension   . Kidney stones    "I've passed them all"  . Migraine    "got off caffeine; they went away"  . Permanent atrial fibrillation ([place])   . Stroke [facility])    TIA ? - 2009   . Tendinitis    History in Rotator cuff  . TIA (transient ischemic attack) ?12/28/2013  . Type II diabetes mellitus ([place])     Past Surgical History:  Procedure Laterality Date  . CARDIOVERSION  X 1  . CYSTOSCOPY WITH LITHOLAPAXY N/A 12/05/2017   Procedure: CYSTOSCOPY WITH LITHOLAPAXY;  Surgeon: Kathie Rhodes, MD;  [facility];  Service: Urology;  Laterality: N/A;  . CYSTOSCOPY WITH RETROGRADE PYELOGRAM, URETEROSCOPY AND STENT PLACEMENT Right 08/18/2015   Procedure: CYSTOSCOPY WITH RIGHT  RETROGRADE PYELOGRAM, RIGHT URETEROGRAM.;   Surgeon: Kathie Rhodes, MD;  [facility];  Service: Urology;  Laterality: Right;  . TONSILLECTOMY      Vitals:   08/19/19 0936  BP: 136/79  Pulse: 84    Subjective Assessment - 08/19/19 0932    Subjective  Bending backwards makes him a little sore - feels it about half an hour after he does it. The other exercises are going well. Pain right now is 2/10.                  Therapeutic Exercise: -SciFit level 3.5 with BUE and BLE for 5 minutes for aerobic warm up, ROM -Reviewed standing lumbar extension at countertop x10 reps -seated hamstring stretch, 2 x 30 seconds B, added to HEP, verbal and demo cues for proper technique -2 x 10 reps prone press ups on elbows with demo cues for technique, 2nd rep of 10 with therapist applying light pressure to central lower lumbar spine at L4/L5 -seated anterior/posterior pelvic tilts x10 reps with demo cues for proper technique  -clamshells x10 reps B, therapist providing tactile cues for proper technique to prevent trunk from rotating while performing -bridging x10 reps, visual and verbal cues for proper technique  -hooklying TA activation 2 x 10 reps - verbal  and tactile cues for technique, added to HEP              PT Education - 08/19/19 1026    Education Details  new additions to HEP    Person(s) Educated  Patient    Methods  Explanation;Demonstration;Handout;Verbal cues    Comprehension  Verbalized understanding;Returned demonstration;Need further instruction       PT Short Term Goals - 08/17/19 1013      PT SHORT TERM GOAL #1   Title  ALL STGS = LTGS        PT Long Term Goals - 08/17/19 1013      PT LONG TERM GOAL #1   Title  Pt will be independent with HEP for strengthening, ROM, and general mobility. ALL LTGS DUE 09/09/19    Time  4    Period  Weeks    Status  New      PT LONG TERM GOAL #2   Title  Pt will be able to stand >10 minutes before needing to sit down to find a chair in order to improve  tolerance for IADLs.    Time  4    Period  Weeks    Status  New      PT LONG TERM GOAL #3   Title  Pt will decr ODI score to 14% or less in order to demo decr disability.    Baseline  24% on 08/11/19    Time  4    Period  Weeks    Status  New      PT LONG TERM GOAL #4   Title  The patient will verbalize understanding of community wellness for post d/c.    Time  4    Period  Weeks    Status  New            Plan - 08/19/19 1623    Clinical Impression Statement  Focus of today's skilled session was B hip strengthening, lumbar ROM, and core activation. Pt tolerated session well with no increase in pain. Needed verbal and demo cues for all exercises. Added hooklying TA activation and seated hamstring stretch to HEP. Will continue to progress towards LTGs.    Personal Factors and Comorbidities  Comorbidity 3+;Time since onset of injury/illness/exacerbation;Past/Current Experience    Comorbidities  type 2 diabetes, HLD, HTN, a fib, stage 3 CKD, CVA 12/2013, R ankle arthritis    Examination-Activity Limitations  Locomotion Level;Stand;Sleep    Examination-Participation Restrictions  Community Activity   playing with grandkids   Stability/Clinical Decision Making  Stable/Uncomplicated    Rehab Potential  Excellent    PT Frequency  2x / week   followed by 1x week for 2 weeks   PT Duration  2 weeks    PT Treatment/Interventions  ADLs/Self Care Home Management;Therapeutic activities;Functional mobility training;Therapeutic exercise;Neuromuscular re-education;Balance training;Patient/family education;Manual techniques    PT Next Visit Plan  core/TA activation. low back, hamstring, and hip IR stretching, general low back mobility, strengthening for hip flexors/ext and ABD. how did sleeping with pillow between knees feel?    PT Home Exercise Plan  Access Code: [email or web address]: [email or web address]: 05/25/2021Prepared by: Gwenyth Bouillon PatelExercisesSupine Lower Trunk Rotation - 2 x  daily - 7 x weekly - 1 sets - 10 repsHooklying Single Knee to Chest Stretch - 2 x daily - 7 x weekly - 1 sets - 10 reps - 5 holdStanding Lumbar Extension with Counter - 2 x daily - 7 x weekly - 1 sets - 10 reps  Consulted and Agree with Plan of Care  Patient       Patient will benefit from skilled therapeutic intervention in order to improve the following deficits and impairments:  Abnormal gait, Decreased mobility, Decreased range of motion, Decreased strength, Hypomobility, Impaired flexibility, Pain, Improper body mechanics, Decreased activity tolerance  Visit Diagnosis: Muscle weakness (generalized)  Chronic midline low back pain without sciatica     Problem List Patient Active Problem List   Diagnosis Date Noted  . Dilated aortic root ([place]) 10/13/2014  . Permanent atrial fibrillation ([place])   . Type 2 diabetes mellitus with other circulatory complications ([place]) [id number]  . HLD (hyperlipidemia) 06/14/2014  . Polycythemia 03/11/2014  . Cerebral infarction due to unspecified mechanism 03/11/2014  . Chronic anticoagulation 03/11/2014  . CVA (cerebral infarction) 12/29/2013  . Encounter for therapeutic drug monitoring 06/25/2013  . Essential hypertension, benign 04/05/2013  . Coronary artery calcification seen on CAT scan 03/25/2009    Arliss Journey , PT, DPT  08/19/2019, 4:23 PM  [facility] [address] [place], [place], [address] Phone: [phone]   Fax:  [phone]  Name: Alvar Huscher [name] MRN: [id number] Date of Birth: [birth year]

## 2019-08-25 ENCOUNTER — Other Ambulatory Visit: Payer: Self-pay

## 2019-08-25 ENCOUNTER — Ambulatory Visit: Payer: Medicare PPO | Attending: Family Medicine | Admitting: Physical Therapy

## 2019-08-25 DIAGNOSIS — M545 Low back pain: Secondary | ICD-10-CM | POA: Diagnosis present

## 2019-08-25 DIAGNOSIS — M6281 Muscle weakness (generalized): Secondary | ICD-10-CM | POA: Diagnosis not present

## 2019-08-25 DIAGNOSIS — G8929 Other chronic pain: Secondary | ICD-10-CM | POA: Diagnosis present

## 2019-08-25 NOTE — Therapy (Signed)
[facility] [address] [place], [place], [address] Phone: [phone]   Fax:  [phone]  Physical Therapy Treatment  Patient Details  Name: Norman [name] MRN: [id number] Date of Birth: [birth year] Referring Provider (PT): Antony Contras, MD   Encounter Date: 08/25/2019  PT End of Session - 08/25/19 1312    Visit Number  4    Number of Visits  7    Date for PT Re-Evaluation  10/10/19    Authorization Type  [employer]    PT Start Time  1228    PT Stop Time  1311    PT Time Calculation (min)  43 min    Activity Tolerance  Patient tolerated treatment well    Behavior During Therapy  [facility] for tasks assessed/performed       Past Medical History:  Diagnosis Date  . Anxiety   . Blood in urine 2 1/2 WEEKS AGO  . BPH (benign prostatic hypertrophy)    Mild  . Coronary artery calcification seen on CAT scan 2011   no ischemia on nuclear stress test 2011  . Dilated aortic root ([place])    54mm by echo 2017  . Dysrhythmia    afib  . Epididymal cyst 2005   Bilateral and small bilateral hydroceles with most recent ultra sound  . Glaucoma   . History of kidney stones   . Hypercholesteremia   . Hypertension   . Kidney stones    "I've passed them all"  . Migraine    "got off caffeine; they went away"  . Permanent atrial fibrillation ([place])   . Stroke [facility])    TIA ? - 2009   . Tendinitis    History in Rotator cuff  . TIA (transient ischemic attack) ?12/28/2013  . Type II diabetes mellitus ([place])     Past Surgical History:  Procedure Laterality Date  . CARDIOVERSION  X 1  . CYSTOSCOPY WITH LITHOLAPAXY N/A 12/05/2017   Procedure: CYSTOSCOPY WITH LITHOLAPAXY;  Surgeon: Kathie Rhodes, MD;  [facility];  Service: Urology;  Laterality: N/A;  . CYSTOSCOPY WITH RETROGRADE PYELOGRAM, URETEROSCOPY AND STENT PLACEMENT Right 08/18/2015   Procedure: CYSTOSCOPY WITH RIGHT  RETROGRADE PYELOGRAM, RIGHT URETEROGRAM.;   Surgeon: Kathie Rhodes, MD;  [facility];  Service: Urology;  Laterality: Right;  . TONSILLECTOMY      There were no vitals filed for this visit.  Subjective Assessment - 08/25/19 1229    Subjective  Was babysitting grandkids over the weekend - thinks he was sitting in a metal chair for too long. Feeling more achy today. No problems after last week. New exercises are going well. Pain right now is a 2/10. Had more pain with standing today.    Pertinent History  type 2 diabetes, HLD, HTN, a fib, stage 3 CKD, CVA 12/2013, R ankle arthritis    How long can you stand comfortably?  can stand without looking for a seat about 5 minutes    How long can you walk comfortably?  30 minutes - 1 hour    Patient Stated Goals  wants to try to get as much relief from pain, wants to learn to do exercises at home.    Currently in Pain?  Yes    Pain Score  2     Pain Location  Back    Pain Orientation  Lower    Pain Descriptors / Indicators  --   "just hurts"  Therapeutic Exercise: -SciFit level 4.0  with BUE and BLE for 5 minutes for aerobic warm up, ROM -standing at countertop with BUE support, shifting posteriorly with trunk flexion for low back/hamstring stretch and then shifting weight and hips anteriorly for lumbar extension, holding each position for a couple seconds for incr stretch x10 reps -staggered stance A/P weight shifting with 10 second holds shifting posteriorly for hamstring stretch x10 reps B, demo cues for technique  -seated physioball roll outs for low back stretch x5 reps forward, x3 reps rolling physioball to R and L for oblique/quadratus lumborum stretch with 10-15 second holds  -x10 reps prone press ups on elbows, verbal cues for technique         [facility] Adult PT Treatment/Exercise - 08/25/19 0001      Manual Therapy   Manual therapy comments  grade II/[name] CPA joint mobilizations to upper lumbar spine and at thoracolumbar junction with pt with incr  hypomobility at T12/L1, pt reporting no incr in pain with mobilizations, just stiffness               PT Short Term Goals - 08/17/19 1013      PT SHORT TERM GOAL #1   Title  ALL STGS = LTGS        PT Long Term Goals - 08/17/19 1013      PT LONG TERM GOAL #1   Title  Pt will be independent with HEP for strengthening, ROM, and general mobility. ALL LTGS DUE 09/09/19    Time  4    Period  Weeks    Status  New      PT LONG TERM GOAL #2   Title  Pt will be able to stand >10 minutes before needing to sit down to find a chair in order to improve tolerance for IADLs.    Time  4    Period  Weeks    Status  New      PT LONG TERM GOAL #3   Title  Pt will decr ODI score to 14% or less in order to demo decr disability.    Baseline  24% on 08/11/19    Time  4    Period  Weeks    Status  New      PT LONG TERM GOAL #4   Title  The patient will verbalize understanding of community wellness for post d/c.    Time  4    Period  Weeks    Status  New            Plan - 08/25/19 1313    Clinical Impression Statement  Focus of today's skilled session was lumbar and hip ROM, activity tolerance, and manual techniques to lumbar/lower thoracic spine. Pt with incr hypomobility to thoracolumbar junction, pt reported no incr in pain with grade [name] CPA joint moiblizations. Pt reported a decr in pain after activity, but when pt performed prone > short sit at edge of mat, pt reporting that his pain incr back up to a 2/10. Will continue to progress towards LTGs.    Personal Factors and Comorbidities  Comorbidity 3+;Time since onset of injury/illness/exacerbation;Past/Current Experience    Comorbidities  type 2 diabetes, HLD, HTN, a fib, stage 3 CKD, CVA 12/2013, R ankle arthritis    Examination-Activity Limitations  Locomotion Level;Stand;Sleep    Examination-Participation Restrictions  Community Activity   playing with grandkids   Stability/Clinical Decision Making  Stable/Uncomplicated     Rehab Potential  Excellent  PT Frequency  2x / week   followed by 1x week for 2 weeks   PT Duration  2 weeks    PT Treatment/Interventions  ADLs/Self Care Home Management;Therapeutic activities;Functional mobility training;Therapeutic exercise;Neuromuscular re-education;Balance training;Patient/family education;Manual techniques    PT Next Visit Plan  add any more stretches to HEP. core/TA activation. low back, hamstring, and hip IR stretching, general low back mobility, strengthening for hip flexors/ext and ABD. how did sleeping with pillow between knees feel?    PT Home Exercise Plan  Access Code: [email or web address]: [email or web address]: 05/25/2021Prepared by: Gwenyth Bouillon PatelExercisesSupine Lower Trunk Rotation - 2 x daily - 7 x weekly - 1 sets - 10 repsHooklying Single Knee to Chest Stretch - 2 x daily - 7 x weekly - 1 sets - 10 reps - 5 holdStanding Lumbar Extension with Counter - 2 x daily - 7 x weekly - 1 sets - 10 reps    Consulted and Agree with Plan of Care  Patient       Patient will benefit from skilled therapeutic intervention in order to improve the following deficits and impairments:  Abnormal gait, Decreased mobility, Decreased range of motion, Decreased strength, Hypomobility, Impaired flexibility, Pain, Improper body mechanics, Decreased activity tolerance  Visit Diagnosis: Muscle weakness (generalized)  Chronic midline low back pain without sciatica     Problem List Patient Active Problem List   Diagnosis Date Noted  . Dilated aortic root ([place]) 10/13/2014  . Permanent atrial fibrillation ([place])   . Type 2 diabetes mellitus with other circulatory complications ([place]) [id number]  . HLD (hyperlipidemia) 06/14/2014  . Polycythemia 03/11/2014  . Cerebral infarction due to unspecified mechanism 03/11/2014  . Chronic anticoagulation 03/11/2014  . CVA (cerebral infarction) 12/29/2013  . Encounter for therapeutic drug monitoring 06/25/2013  . Essential  hypertension, benign 04/05/2013  . Coronary artery calcification seen on CAT scan 03/25/2009    Arliss Journey, PT, DPT  08/25/2019, 1:16 PM  [facility] [address] [place], [place], [address] Phone: [phone]   Fax:  [phone]  Name: Norman [name] MRN: [id number] Date of Birth: [birth year]

## 2019-08-27 ENCOUNTER — Ambulatory Visit: Payer: Medicare PPO | Admitting: Physical Therapy

## 2019-08-31 ENCOUNTER — Encounter: Payer: Self-pay | Admitting: Physical Therapy

## 2019-08-31 ENCOUNTER — Ambulatory Visit: Payer: Medicare PPO | Admitting: Physical Therapy

## 2019-08-31 ENCOUNTER — Other Ambulatory Visit: Payer: Self-pay

## 2019-08-31 DIAGNOSIS — M6281 Muscle weakness (generalized): Secondary | ICD-10-CM

## 2019-08-31 DIAGNOSIS — G8929 Other chronic pain: Secondary | ICD-10-CM

## 2019-08-31 NOTE — Therapy (Signed)
[facility] [address] [place], [place], [address] Phone: [phone]   Fax:  [phone]  Physical Therapy Treatment  Patient Details  Name: Norman [name] MRN: [id number] Date of Birth: 30-Dec-1945 Referring Provider (PT): Antony Contras, MD   Encounter Date: 08/31/2019  PT End of Session - 08/31/19 1100    Visit Number  5    Number of Visits  7    Date for PT Re-Evaluation  10/10/19    Authorization Type  [employer]    PT Start Time  [phone]    PT Stop Time  1013    PT Time Calculation (min)  42 min    Activity Tolerance  Patient tolerated treatment well    Behavior During Therapy  [facility] for tasks assessed/performed       Past Medical History:  Diagnosis Date  . Anxiety   . Blood in urine 2 1/2 WEEKS AGO  . BPH (benign prostatic hypertrophy)    Mild  . Coronary artery calcification seen on CAT scan 2011   no ischemia on nuclear stress test 2011  . Dilated aortic root ([place])    86mm by echo 2017  . Dysrhythmia    afib  . Epididymal cyst 2005   Bilateral and small bilateral hydroceles with most recent ultra sound  . Glaucoma   . History of kidney stones   . Hypercholesteremia   . Hypertension   . Kidney stones    "I've passed them all"  . Migraine    "got off caffeine; they went away"  . Permanent atrial fibrillation ([place])   . Stroke [facility])    TIA ? - 2009   . Tendinitis    History in Rotator cuff  . TIA (transient ischemic attack) ?12/28/2013  . Type II diabetes mellitus ([place])     Past Surgical History:  Procedure Laterality Date  . CARDIOVERSION  X 1  . CYSTOSCOPY WITH LITHOLAPAXY N/A 12/05/2017   Procedure: CYSTOSCOPY WITH LITHOLAPAXY;  Surgeon: Kathie Rhodes, MD;  Location: [facility];  Service: Urology;  Laterality: N/A;  . CYSTOSCOPY WITH RETROGRADE PYELOGRAM, URETEROSCOPY AND STENT PLACEMENT Right 08/18/2015   Procedure: CYSTOSCOPY WITH RIGHT  RETROGRADE PYELOGRAM, RIGHT URETEROGRAM.;   Surgeon: Kathie Rhodes, MD;  Location: [facility];  Service: Urology;  Laterality: Right;  . TONSILLECTOMY      There were no vitals filed for this visit.  Subjective Assessment - 08/31/19 0932    Subjective  Back is doing slightly better. Still notices it more when he is standing for longer periods of time. Pain wasn't bad over the weekend. Pain right now is about a 1.5/10. Still hurts when it is laying in a way that it does not like. Exercises are going ok.    Pertinent History  type 2 diabetes, HLD, HTN, a fib, stage 3 CKD, CVA 12/2013, R ankle arthritis    How long can you stand comfortably?  can stand without looking for a seat about 5 minutes    How long can you walk comfortably?  30 minutes - 1 hour    Patient Stated Goals  wants to try to get as much relief from pain, wants to learn to do exercises at home.    Currently in Pain?  Yes    Pain Score  --   1.5   Pain Location  Back    Pain Orientation  Lower  Therapeutic Exercise:  -standing trunk rotation in corner x10 reps B with wide BOS and cues to pivot on feet for incr rotation, holding for a couple seconds at end range for lumbar stretch  -at countertop: standing lumbar flexion for hamstring and low back stretch and then lumbar extension (weight shifting forwards and bringing hips towards countertop) x10 reps, added to HEP  -standing hip ABD 2 x 10 reps B, verbal/demo/tactile cues for proper technique -standing hip extension 1 x 10 reps B, cues for proper technique -x10 reps lateral weight shifting R and L with cues for technique and education to perform when standing still for prolonged periods of time in order to help decr low back pain with prolonged standing   -lower trunk rotations with hip IR/ER 3 reps B with 15 second holds  -supine hooklying piriformis stretch 3 x 30 seconds B  -TA activation with bent knee fall outs x8 reps B, verbal and tactile cues for technique  -bent knee fall outs  with red theraband for hip ABD strengthening x10 reps B, cues for eccentric control          PT Education - 08/31/19 1010    Education Details  standing lumbar forward flexion/extension stretch to HEP - performed at countertop.    Person(s) Educated  Patient    Methods  Explanation;Demonstration;Handout    Comprehension  Verbalized understanding;Returned demonstration       PT Short Term Goals - 08/17/19 1013      PT SHORT TERM GOAL #1   Title  ALL STGS = LTGS        PT Long Term Goals - 08/17/19 1013      PT LONG TERM GOAL #1   Title  Pt will be independent with HEP for strengthening, ROM, and general mobility. ALL LTGS DUE 09/09/19    Time  4    Period  Weeks    Status  New      PT LONG TERM GOAL #2   Title  Pt will be able to stand >10 minutes before needing to sit down to find a chair in order to improve tolerance for IADLs.    Time  4    Period  Weeks    Status  New      PT LONG TERM GOAL #3   Title  Pt will decr ODI score to 14% or less in order to demo decr disability.    Baseline  24% on 08/11/19    Time  4    Period  Weeks    Status  New      PT LONG TERM GOAL #4   Title  The patient will verbalize understanding of community wellness for post d/c.    Time  4    Period  Weeks    Status  New            Plan - 08/31/19 1101    Clinical Impression Statement  Focus of today's skilled session was hip and core strengthening and stretches for lumbar/hip ROM. Pt tolerated session well with no incr in pain. Added standing forward flexion and lumbar extension stretch to HEP to perform at countertop. Discussed with pt performing standing lateral weight shifting with wide BOS when standing for prolonged periods in order to decr low back pain. Will continue to progress towards LTGs.    Personal Factors and Comorbidities  Comorbidity 3+;Time since onset of injury/illness/exacerbation;Past/Current Experience    Comorbidities  type 2 diabetes, HLD, HTN, a  fib,  stage 3 CKD, CVA 12/2013, R ankle arthritis    Examination-Activity Limitations  Locomotion Level;Stand;Sleep    Examination-Participation Restrictions  Community Activity   playing with grandkids   Stability/Clinical Decision Making  Stable/Uncomplicated    Rehab Potential  Excellent    PT Frequency  2x / week   followed by 1x week for 2 weeks   PT Duration  2 weeks    PT Treatment/Interventions  ADLs/Self Care Home Management;Therapeutic activities;Functional mobility training;Therapeutic exercise;Neuromuscular re-education;Balance training;Patient/family education;Manual techniques    PT Next Visit Plan  how is HEP and weight shifting during standing?  core/TA activation. low back, hamstring, and hip IR stretching, general low back mobility, strengthening for hip flexors/ext and ABD.    PT Home Exercise Plan  Access Code: [email or web address]: [email or web address]: 05/25/2021Prepared by: Gwenyth Bouillon PatelExercisesSupine Lower Trunk Rotation - 2 x daily - 7 x weekly - 1 sets - 10 repsHooklying Single Knee to Chest Stretch - 2 x daily - 7 x weekly - 1 sets - 10 reps - 5 holdStanding Lumbar Extension with Counter - 2 x daily - 7 x weekly - 1 sets - 10 reps    Consulted and Agree with Plan of Care  Patient       Patient will benefit from skilled therapeutic intervention in order to improve the following deficits and impairments:  Abnormal gait, Decreased mobility, Decreased range of motion, Decreased strength, Hypomobility, Impaired flexibility, Pain, Improper body mechanics, Decreased activity tolerance  Visit Diagnosis: Chronic midline low back pain without sciatica  Muscle weakness (generalized)     Problem List Patient Active Problem List   Diagnosis Date Noted  . Dilated aortic root ([place]) 10/13/2014  . Permanent atrial fibrillation ([place])   . Type 2 diabetes mellitus with other circulatory complications ([place]) 87/86/7672  . HLD (hyperlipidemia) 06/14/2014  . Polycythemia  03/11/2014  . Cerebral infarction due to unspecified mechanism 03/11/2014  . Chronic anticoagulation 03/11/2014  . CVA (cerebral infarction) 12/29/2013  . Encounter for therapeutic drug monitoring 06/25/2013  . Essential hypertension, benign 04/05/2013  . Coronary artery calcification seen on CAT scan 03/25/2009    Arliss Journey, PT, DPT  08/31/2019, 2:30 PM  [facility] [address] [place], [place], [address] Phone: [phone]   Fax:  [phone]  Name: Norman [name] MRN: [id number] Date of Birth: [birth year]

## 2019-09-02 ENCOUNTER — Ambulatory Visit: Payer: Medicare PPO | Admitting: Physical Therapy

## 2019-09-02 ENCOUNTER — Encounter: Payer: Self-pay | Admitting: Physical Therapy

## 2019-09-02 ENCOUNTER — Other Ambulatory Visit: Payer: Self-pay

## 2019-09-02 DIAGNOSIS — M6281 Muscle weakness (generalized): Secondary | ICD-10-CM

## 2019-09-02 DIAGNOSIS — G8929 Other chronic pain: Secondary | ICD-10-CM

## 2019-09-02 NOTE — Therapy (Signed)
[facility] [address] [place], [place], [address] Phone: [phone]   Fax:  [phone]  Physical Therapy Treatment  Patient Details  Name: Norman [name] MRN: [id number] Date of Birth: [birth year] Referring Provider (PT): Antony Contras, MD   Encounter Date: 09/02/2019   PT End of Session - 09/02/19 1036    Visit Number 6    Number of Visits 7    Date for PT Re-Evaluation 10/10/19    Authorization Type [employer]    PT Start Time 0935    PT Stop Time 8588    PT Time Calculation (min) 39 min    Activity Tolerance Patient tolerated treatment well    Behavior During Therapy [facility] for tasks assessed/performed           Past Medical History:  Diagnosis Date   Anxiety    Blood in urine 2 1/2 WEEKS AGO   BPH (benign prostatic hypertrophy)    Mild   Coronary artery calcification seen on CAT scan 2011   no ischemia on nuclear stress test 2011   Dilated aortic root ([place])    68mm by echo 2017   Dysrhythmia    afib   Epididymal cyst 2005   Bilateral and small bilateral hydroceles with most recent ultra sound   Glaucoma    History of kidney stones    Hypercholesteremia    Hypertension    Kidney stones    "I've passed them all"   Migraine    "got off caffeine; they went away"   Permanent atrial fibrillation ([place])    Stroke ([place])    TIA ? - 2009    Tendinitis    History in Rotator cuff   TIA (transient ischemic attack) ?12/28/2013   Type II diabetes mellitus ([place])     Past Surgical History:  Procedure Laterality Date   CARDIOVERSION  X 1   CYSTOSCOPY WITH LITHOLAPAXY N/A 12/05/2017   Procedure: CYSTOSCOPY WITH LITHOLAPAXY;  Surgeon: Kathie Rhodes, MD;  [facility];  Service: Urology;  Laterality: N/A;   CYSTOSCOPY WITH RETROGRADE PYELOGRAM, URETEROSCOPY AND STENT PLACEMENT Right 08/18/2015   Procedure: CYSTOSCOPY WITH RIGHT  RETROGRADE PYELOGRAM, RIGHT URETEROGRAM.;   Surgeon: Kathie Rhodes, MD;  [facility];  Service: Urology;  Laterality: Right;   TONSILLECTOMY      There were no vitals filed for this visit.   Subjective Assessment - 09/02/19 0937    Subjective Had a rough night with the back, woke him up and was hurting. Stretches did not help with the pain. Right now pain is at a 2-2.5/10. Tried doing the standing weight shifting R/L while standing for a prolonged period of time and states that it felt better.    Pertinent History type 2 diabetes, HLD, HTN, a fib, stage 3 CKD, CVA 12/2013, R ankle arthritis    How long can you stand comfortably? can stand without looking for a seat about 5 minutes    How long can you walk comfortably? 30 minutes - 1 hour    Patient Stated Goals wants to try to get as much relief from pain, wants to learn to do exercises at home.    Currently in Pain? Yes    Pain Score 2     Pain Location Back    Pain Orientation Lower                          Therapeutic Exercise: -SciFit  level 4.0  with BUE and BLE for 5 minutes for aerobic warm up, ROM. -prone press ups 2 x 10 reps (one set performed after manual therapy to L4/L5 and T12/L1 for incr extension) -prone hip flexor/quad stretch, muscle energy technique x4 reps B with 30 second holds -prone hip ER stretch with therapist assisting into position 3 x 30 seconds B   -with arms extended lower trunk rotations x4 reps B with 20 second holds -ambulated 230' at end of session, pt reporting an incr in low back pain to 3/10         09/02/19 0001  Manual Therapy  Manual therapy comments grade [name] CPA joint mobilizations to incr lumbar extension to upper/lower lumbar spine and at thoracolumbar junction with pt with incr hypomobility at T12/L1, pt reporting no incr in pain with mobilizations, just stiffness        PT Short Term Goals - 08/17/19 1013      PT SHORT TERM GOAL #1   Title ALL STGS = LTGS             PT Long Term Goals -  08/17/19 1013      PT LONG TERM GOAL #1   Title Pt will be independent with HEP for strengthening, ROM, and general mobility. ALL LTGS DUE 09/09/19    Time 4    Period Weeks    Status New      PT LONG TERM GOAL #2   Title Pt will be able to stand >10 minutes before needing to sit down to find a chair in order to improve tolerance for IADLs.    Time 4    Period Weeks    Status New      PT LONG TERM GOAL #3   Title Pt will decr ODI score to 14% or less in order to demo decr disability.    Baseline 24% on 08/11/19    Time 4    Period Weeks    Status New      PT LONG TERM GOAL #4   Title The patient will verbalize understanding of community wellness for post d/c.    Time 4    Period Weeks    Status New                 Plan - 09/02/19 1211    Clinical Impression Statement Pt demonstrating improved lumbar extension with prone presss ups after Grade [name] CPA mobilizations to lower lumbar and thoracic spine. At end of session pt reporting a slight increase in pain to 3/10 while ambulating after stretching for hips/low back. Will assess LTGs at next session.    Personal Factors and Comorbidities Comorbidity 3+;Time since onset of injury/illness/exacerbation;Past/Current Experience    Comorbidities type 2 diabetes, HLD, HTN, a fib, stage 3 CKD, CVA 12/2013, R ankle arthritis    Examination-Activity Limitations Locomotion Level;Stand;Sleep    Examination-Participation Restrictions Community Activity   playing with grandkids   Stability/Clinical Decision Making Stable/Uncomplicated    Rehab Potential Excellent    PT Frequency 2x / week   followed by 1x week for 2 weeks   PT Duration 2 weeks    PT Treatment/Interventions ADLs/Self Care Home Management;Therapeutic activities;Functional mobility training;Therapeutic exercise;Neuromuscular re-education;Balance training;Patient/family education;Manual techniques    PT Next Visit Plan check goals. how is HEP and weight shifting during  standing?  core/TA activation. low back, hamstring, and hip IR stretching, general low back mobility, strengthening for hip flexors/ext and ABD.    PT Home  Exercise Plan Access Code: [email or web address]: [email or web address]: 05/25/2021Prepared by: Gwenyth Bouillon PatelExercisesSupine Lower Trunk Rotation - 2 x daily - 7 x weekly - 1 sets - 10 repsHooklying Single Knee to Chest Stretch - 2 x daily - 7 x weekly - 1 sets - 10 reps - 5 holdStanding Lumbar Extension with Counter - 2 x daily - 7 x weekly - 1 sets - 10 reps    Consulted and Agree with Plan of Care Patient           Patient will benefit from skilled therapeutic intervention in order to improve the following deficits and impairments:  Abnormal gait, Decreased mobility, Decreased range of motion, Decreased strength, Hypomobility, Impaired flexibility, Pain, Improper body mechanics, Decreased activity tolerance  Visit Diagnosis: Chronic midline low back pain without sciatica  Muscle weakness (generalized)     Problem List Patient Active Problem List   Diagnosis Date Noted   Dilated aortic root ([place]) 10/13/2014   Permanent atrial fibrillation ([place])    Type 2 diabetes mellitus with other circulatory complications ([place]) 97/58/8325   HLD (hyperlipidemia) 06/14/2014   Polycythemia 03/11/2014   Cerebral infarction due to unspecified mechanism 03/11/2014   Chronic anticoagulation 03/11/2014   CVA (cerebral infarction) 12/29/2013   Encounter for therapeutic drug monitoring 06/25/2013   Essential hypertension, benign 04/05/2013   Coronary artery calcification seen on CAT scan 03/25/2009    Arliss Journey, PT, DPT  09/02/2019, 12:16 PM  [facility] [address] [place], [place], [address] Phone: [phone]   Fax:  [phone]  Name: Norman [name] MRN: [id number] Date of Birth: 21-Aug-1945

## 2019-09-09 ENCOUNTER — Other Ambulatory Visit: Payer: Self-pay

## 2019-09-09 ENCOUNTER — Encounter: Payer: Self-pay | Admitting: Physical Therapy

## 2019-09-09 ENCOUNTER — Ambulatory Visit: Payer: Medicare PPO | Admitting: Physical Therapy

## 2019-09-09 DIAGNOSIS — M6281 Muscle weakness (generalized): Secondary | ICD-10-CM

## 2019-09-09 DIAGNOSIS — G8929 Other chronic pain: Secondary | ICD-10-CM

## 2019-09-09 NOTE — Therapy (Signed)
[place] [address] [place], [place], [address] Phone: [phone]   Fax:  [phone]  Physical Therapy Treatment/Discharge Summary  Patient Details  Name: Norman [name] MRN: [id number] Date of Birth: Jan 30, 1946 Referring Provider (PT): Antony Contras, MD   Encounter Date: 09/09/2019   PT End of Session - 09/09/19 1158    Visit Number 7    Number of Visits 7    Date for PT Re-Evaluation 10/10/19    Authorization Type [employer]    PT Start Time 0933    PT Stop Time 1013    PT Time Calculation (min) 40 min    Activity Tolerance Patient tolerated treatment well    Behavior During Therapy [facility] for tasks assessed/performed           Past Medical History:  Diagnosis Date  . Anxiety   . Blood in urine 2 1/2 WEEKS AGO  . BPH (benign prostatic hypertrophy)    Mild  . Coronary artery calcification seen on CAT scan 2011   no ischemia on nuclear stress test 2011  . Dilated aortic root ([place])    64m by echo 2017  . Dysrhythmia    afib  . Epididymal cyst 2005   Bilateral and small bilateral hydroceles with most recent ultra sound  . Glaucoma   . History of kidney stones   . Hypercholesteremia   . Hypertension   . Kidney stones    "I've passed them all"  . Migraine    "got off caffeine; they went away"  . [place]   . Stroke ([facility]    TIA ? - 2009   . Tendinitis    History in Rotator cuff  . TIA (transient ischemic attack) ?12/28/2013  . [place] II [place]     Past Surgical History:  Procedure Laterality Date  . CARDIOVERSION  X 1  . CYSTOSCOPY WITH LITHOLAPAXY N/A 12/05/2017   Procedure: CYSTOSCOPY WITH LITHOLAPAXY;  Surgeon: OKathie Rhodes MD;  Location: [facility];  Service: Urology;  Laterality: N/A;  . CYSTOSCOPY WITH RETROGRADE PYELOGRAM, URETEROSCOPY AND STENT PLACEMENT Right 08/18/2015   Procedure: CYSTOSCOPY WITH RIGHT  RETROGRADE PYELOGRAM, RIGHT  URETEROGRAM.;  Surgeon: MKathie Rhodes MD;  Location: [facility];  Service: Urology;  Laterality: Right;  . TONSILLECTOMY      There were no vitals filed for this visit.   Subjective Assessment - 09/09/19 0935    Subjective Some days are better than others, but overall the pain is easing up. Tried the lateral weight shifting during prolonged standing and it didn't help. Pain is still at a 2/10. Some days at the end of a busy day, it hurts. Some day the stiffness is less and seems to subside quicker. Pain does not wake him up at night. Still sleeping on his side with a pillow between his legs.    Pertinent History type 2 diabetes, HLD, HTN, a fib, stage 3 CKD, CVA 12/2013, R ankle arthritis    How long can you stand comfortably? can stand without looking for a seat about 5 minutes    How long can you walk comfortably? 30 minutes - 1 hour    Patient Stated Goals wants to try to get as much relief from pain, wants to learn to do exercises at home.    Currently in Pain? Yes    Pain Score 2     Pain Location Back    Pain Orientation Lower  [facility] Adult PT Treatment/Exercise - 09/09/19 0948      Therapeutic Activites    Therapeutic Activities Other Therapeutic Activities    Other Therapeutic Activities 30% on ODI. Discussed POC with pt and today being pt's last visit, discussed extending or D/C. Pt reporting having a lot going on right now and would prefer D/C and to continue working on his exercises on his own at home due to having improvements in his back pain/activity tolerance. Discussed with pt that if he continues to have pain then pt will need to get another referral from his physician to resume PT, pt verbalized understanding.               Access Code: [email or web address] URL: [email or web address] Date: 09/09/2019 Prepared by: Janann August  Reviewed final HEP:   Exercises Supine Lower Trunk Rotation - 2 x daily - 7 x weekly - 1  sets - 10 reps Hooklying Single Knee to Chest Stretch - 2 x daily - 7 x weekly - 1 sets - 10 reps - 20-30 hold - cues to hold or 20-30 seconds (pt previously had only been holding for 5 seconds) Standing Lumbar Extension with Counter - 2 x daily - 7 x weekly - 1 sets - 10 reps Seated Hamstring Stretch - 1-2 x daily - 7 x weekly - 3 sets - 30 hold - cues for technique and holding for 30 seconds  Standing Lumbar Spine Flexion Stretch Counter - 1 x daily - 7 x weekly - 1-2 sets - 10 reps Prone Press Up on Elbows - 1 x daily - 7 x weekly - 1-2 sets - 10 reps Hooklying Single Leg Bent Knee Fallouts with Resistance - 1 x daily - 5 x weekly - 2 sets - 10 reps - with use of red band, cues for TA activation       PT Education - 09/09/19 1158    Education Details see TA. reviewed HEP.    Person(s) Educated Patient    Methods Explanation;Demonstration;Handout    Comprehension Verbalized understanding;Returned demonstration;Verbal cues required            PT Short Term Goals - 08/17/19 1013      PT SHORT TERM GOAL #1   Title ALL STGS = LTGS             PT Long Term Goals - 09/09/19 0941      PT LONG TERM GOAL #1   Title Pt will be independent with HEP for strengthening, ROM, and general mobility. ALL LTGS DUE 09/09/19    Time 4    Period Weeks    Status Achieved      PT LONG TERM GOAL #2   Title Pt will be able to stand >10 minutes before needing to sit down to find a chair in order to improve tolerance for IADLs.    Baseline pt reports that he can stand longer from before    Time 4    Period Weeks    Status Achieved      PT LONG TERM GOAL #3   Title Pt will decr ODI score to 14% or less in order to demo decr disability.    Baseline 24% on 08/11/19, 30% on 09/09/19    Time 4    Period Weeks    Status Not Met      PT LONG TERM GOAL #4   Title The patient will verbalize understanding of community wellness for post d/c.    Baseline pt verbalizing  walking program for post d/c and  HEP    Time 4    Period Weeks    Status Achieved              PHYSICAL THERAPY DISCHARGE SUMMARY  Visits from Start of Care: 7  Current functional level related to goals / functional outcomes: See LTGs.   Remaining deficits: Low back pain, decr activity tolerance, hip weakness   Education / Equipment: HEP  Plan: Patient agrees to discharge.  Patient goals were partially met. Patient is being discharged due to being pleased with the current functional level.  ?????             Plan - 09/09/19 1211    Clinical Impression Statement Focus of today's skilled session was assessing pt's LTGs. Pt achieved 3 out of 4 LTGs in regards to HEP and pt reporting being able to incr his standing/walking tolerance since coming to therapy. Pt had an incr in his ODI score from a 24% to a 30% at today's visit, indicating continued moderate disability. However pt does report some questions were confusing and overall reports an improvement since coming to therapy. Discussed D/C vs. Continuing POC with pt wishing to D/C at this time - pt reports improvements in his mobility and pain (however does still have 2/10 low back pain) and wishes to work on his exercises at home and resume PT in the future if needed. Discussed need for another referral in the future if he does wish to return with pt verbalizing understanding.    Personal Factors and Comorbidities Comorbidity 3+;Time since onset of injury/illness/exacerbation;Past/Current Experience    Comorbidities type 2 diabetes, HLD, HTN, a fib, stage 3 CKD, CVA 12/2013, R ankle arthritis    Examination-Activity Limitations Locomotion Level;Stand;Sleep    Examination-Participation Restrictions Community Activity   playing with grandkids   Stability/Clinical Decision Making Stable/Uncomplicated    Rehab Potential Excellent    PT Frequency 2x / week   followed by 1x week for 2 weeks   PT Duration 2 weeks    PT Treatment/Interventions ADLs/Self Care Home  Management;Therapeutic activities;Functional mobility training;Therapeutic exercise;Neuromuscular re-education;Balance training;Patient/family education;Manual techniques    PT Next Visit Plan D/C    PT Home Exercise Plan Access Code: [email or web address]: [email or web address]: 05/25/2021Prepared by: Gwenyth Bouillon PatelExercisesSupine Lower Trunk Rotation - 2 x daily - 7 x weekly - 1 sets - 10 repsHooklying Single Knee to Chest Stretch - 2 x daily - 7 x weekly - 1 sets - 10 reps - 5 holdStanding Lumbar Extension with Counter - 2 x daily - 7 x weekly - 1 sets - 10 reps    Consulted and Agree with Plan of Care Patient           Patient will benefit from skilled therapeutic intervention in order to improve the following deficits and impairments:  Abnormal gait, Decreased mobility, Decreased range of motion, Decreased strength, Hypomobility, Impaired flexibility, Pain, Improper body mechanics, Decreased activity tolerance  Visit Diagnosis: Chronic midline low back pain without sciatica  Muscle weakness (generalized)     Problem List Patient Active Problem List   Diagnosis Date Noted  . Dilated aortic root ([place]) 10/13/2014  . Permanent atrial fibrillation ([place])   . Type 2 diabetes mellitus with other circulatory complications ([place]) 81/85/6314  . HLD (hyperlipidemia) 06/14/2014  . Polycythemia 03/11/2014  . Cerebral infarction due to unspecified mechanism 03/11/2014  . Chronic anticoagulation 03/11/2014  . CVA (cerebral infarction) 12/29/2013  . Encounter for therapeutic drug monitoring 06/25/2013  .  Essential hypertension, benign 04/05/2013  . Coronary artery calcification seen on CAT scan 03/25/2009    Arliss Journey, PT, DPT  09/09/2019, 12:11 PM  [place] [address] [place], [place], [address] Phone: [phone]   Fax:  [phone]  Name: Kc Summerson [name] MRN: [id number] Date of Birth:  15-Mar-1946

## 2019-09-18 ENCOUNTER — Other Ambulatory Visit: Payer: Self-pay | Admitting: Cardiology

## 2019-09-20 NOTE — Telephone Encounter (Addendum)
Pt last saw Dr Radford Pax 11/10/18 telemedicine Covid-19, last labs 08/02/19 Creat 1.2 at [facility] per [facility], age 74, weight 106.6kg, based on specified criteria pt is on appropriate dosage of Eliquis 5mg  BID.  Will refill rx.

## 2019-10-20 ENCOUNTER — Other Ambulatory Visit: Payer: Self-pay | Admitting: Cardiology

## 2019-10-21 ENCOUNTER — Telehealth: Payer: Self-pay | Admitting: Cardiology

## 2019-10-21 ENCOUNTER — Other Ambulatory Visit: Payer: Self-pay

## 2019-10-21 MED ORDER — EZETIMIBE 10 MG PO TABS: 10.0000 mg | ORAL_TABLET | Freq: Every day | ORAL | 0 refills | Status: DC

## 2019-10-21 NOTE — Telephone Encounter (Signed)
Pt's medication was sent to pt's pharmacy as requested. Confirmation received.  °

## 2019-10-21 NOTE — Telephone Encounter (Signed)
Patient states he needs to have dental work done and the office performing the procedure would like to inquire about whether or not Eliquis needs to be held. Patient states Dr. Saul Fordyce dentist office faxed the request for cardiac clearance a few weeks ago. However, they will resubmit it.

## 2019-10-26 NOTE — Telephone Encounter (Signed)
Spoke with patient to confirm which Dr Owens Shark DDS as there were multiple. Patient confirmed that it was Dr Jeannie Done DDS. I called and spoke with Dr Jeannie Done DDS and informed her that we still have not received that cardiac clearance form for patient. Confirmed fax number [phone] and she will have her staff fax this to our office.

## 2019-11-16 ENCOUNTER — Telehealth: Payer: Self-pay | Admitting: *Deleted

## 2019-11-16 NOTE — Telephone Encounter (Signed)
Left message to call back.  Kerin Ransom PA-C 11/16/2019 11:43 AM

## 2019-11-16 NOTE — Telephone Encounter (Signed)
Patient with diagnosis of atrial fibrillation on Eliquis for anticoagulation.    Procedure: Osteo Perio Surgery Date of procedure: 11/19/19  CHADS2-VASc score of  7 (HTN, AGEx2, DM2, stroke, CAD)  CrCl 60 ml/min Platelet count 240K  Per office protocol, patient can hold Eliquis for 1 day prior to procedure.    Patient should restart Eliquis as safely as possible at discretion of procedure MD   Lorel Monaco, PharmD PGY2 Ambulatory Care Resident [place]

## 2019-11-16 NOTE — Telephone Encounter (Signed)
Follow Up:     Pt is returning your call. 

## 2019-11-16 NOTE — Telephone Encounter (Signed)
   Primary Cardiologist: Fransico Him, MD  Chart reviewed and patient contacted by phone today as part of pre-operative protocol coverage. Given past medical history and time since last visit, based on ACC/AHA guidelines, Norman [name] would be at acceptable risk for the planned procedure without further cardiovascular testing.   OK to hold Eliquis one day pre op and resume as soon as safe post op.   I will route this recommendation to the requesting party via Epic fax function and remove from pre-op pool.  Please call with questions.  Kerin Ransom, PA-C 11/16/2019, 1:51 PM

## 2019-11-16 NOTE — Telephone Encounter (Signed)
Pharmacy please comment on anticoagulation in this patient prior to extensive dental, oral surgery.  Kerin Ransom PA-C 11/16/2019 10:56 AM

## 2019-11-16 NOTE — Telephone Encounter (Signed)
   [facility] Pre-operative Risk Assessment    HEARTCARE STAFF: - Please ensure there is not already an duplicate clearance open for this procedure. - Under Visit Info/Reason for Call, type in Other and utilize the format Clearance MM/DD/YY or Clearance TBD. Do not use dashes or single digits. - If request is for dental extraction, please clarify the # of teeth to be extracted.  Request for surgical clearance:  1. What type of surgery is being performed? OSTEO PERIO SURGERY; THERE WILL BE CUTTING INTO THE GUMLINES, POSSIBLE SHAVING OF BONE, LAYING A FLAP AND RE STITCHING FLAP BACK IN PLACE. THIS IS TO BE DONE ON 2-3 TEETH ; THERE ARE NO TEETH TO BE REMOVED    2. When is this surgery scheduled? 11/19/19 IS PREFERRED DATE THOUGH 11/23/19 IS A POSSIBILITY    3. What type of clearance is required (medical clearance vs. Pharmacy clearance to hold med vs. Both)? BOTH  4. Are there any medications that need to be held prior to surgery and how long? ELQUIS   5. Practice name and name of physician performing surgery? Jeralene Peters, DDS   6. What is the office phone number? [phone]   7.   What is the office fax number? [phone]  8.   Anesthesia type (None, local, MAC, general) ? LOCAL   Julaine Hua 11/16/2019, 10:44 AM  _________________________________________________________________   (provider comments below)

## 2019-12-02 NOTE — Progress Notes (Signed)
Date:  12/03/2019   ID:  Norman [name], DOB [birth year], MRN [id number]   PCP:  Antony Contras, MD  Cardiologist:  Fransico Him, MD  Electrophysiologist:  None   Chief Complaint:  Afib, CAD, HTN, HLD  History of Present Illness:    Norman [name] is a 74 y.o. male with a hx of permanentatrial fibrillation, carotid artery stenosis,coronary artery calcifications by CT,hyperlipidemia and dilated aortic root and HTN.  He is here today for followup and is doing well.  He denies any chest pain or pressure, SOB, DOE, PND, orthopnea, LE edema, dizziness, palpitations or syncope. He is compliant with his meds and is tolerating meds with no SE.    Prior CV studies:   The following studies were reviewed today:  EKG  Past Medical History:  Diagnosis Date  . Anxiety   . Blood in urine 2 1/2 WEEKS AGO  . BPH (benign prostatic hypertrophy)    Mild  . Coronary artery calcification seen on CAT scan 2011   no ischemia on nuclear stress test 2011  . Dilated aortic root ([place])    69mm by echo 2017  . Dysrhythmia    afib  . Epididymal cyst 2005   Bilateral and small bilateral hydroceles with most recent ultra sound  . Glaucoma   . History of kidney stones   . Hypercholesteremia   . Hypertension   . Kidney stones    "I've passed them all"  . Migraine    "got off caffeine; they went away"  . Permanent atrial fibrillation ([place])   . Stroke [facility])    TIA ? - 2009   . Tendinitis    History in Rotator cuff  . TIA (transient ischemic attack) ?12/28/2013  . Type II diabetes mellitus ([place])    Past Surgical History:  Procedure Laterality Date  . CARDIOVERSION  X 1  . CYSTOSCOPY WITH LITHOLAPAXY N/A 12/05/2017   Procedure: CYSTOSCOPY WITH LITHOLAPAXY;  Surgeon: Kathie Rhodes, MD;  [facility];  Service: Urology;  Laterality: N/A;  . CYSTOSCOPY WITH RETROGRADE PYELOGRAM, URETEROSCOPY AND STENT PLACEMENT Right 08/18/2015   Procedure: CYSTOSCOPY WITH RIGHT  RETROGRADE PYELOGRAM,  RIGHT URETEROGRAM.;  Surgeon: Kathie Rhodes, MD;  Location[facility];  Service: Urology;  Laterality: Right;  . TONSILLECTOMY       Current Meds  Medication Sig  . atorvastatin (LIPITOR) 80 MG tablet Take 80 mg by mouth every evening.   . chlorthalidone (HYGROTON) 25 MG tablet Take 12.5 mg by mouth daily.   . COMBIGAN 0.2-0.5 % ophthalmic solution Place 1 drop into both eyes 2 (two) times daily.  Marland Kitchen diltiazem (TIAZAC) 240 MG 24 hr capsule Take 1 capsule by mouth daily.  Marland Kitchen ELIQUIS 5 MG TABS tablet TAKE 1 TABLET BY MOUTH TWICE A DAY  . ezetimibe (ZETIA) 10 MG tablet Take 1 tablet (10 mg total) by mouth daily. Please make yearly appt with Dr. Radford Pax for August for future refills. 1st attempt  . LUMIGAN 0.01 % SOLN   . metFORMIN (GLUCOPHAGE) 500 MG tablet Take 500 mg by mouth daily with breakfast.  . metoprolol tartrate (LOPRESSOR) 25 MG tablet Take 25 mg by mouth 2 (two) times daily.  . valsartan (DIOVAN) 320 MG tablet Take 320 mg by mouth daily.  Marland Kitchen zolpidem (AMBIEN CR) 12.5 MG CR tablet Take 12.5 mg by mouth at bedtime.      Allergies:   Patient has no known allergies.   Social History   Tobacco Use  .  Smoking status: Never Smoker  . Smokeless tobacco: Never Used  Vaping Use  . Vaping Use: Never used  Substance Use Topics  . Alcohol use: Yes    Alcohol/week: 1.0 standard drink    Types: 1 Glasses of wine per week    Comment: occ  . Drug use: No     Family Hx: The patient's family history includes Diabetes in his maternal aunt; Heart attack in his father; Kidney failure in his mother.  ROS:   Please see the history of present illness.     All other systems reviewed and are negative.   Labs/Other Tests and Data Reviewed:    Recent Labs: 01/06/2019: ALT 28   Recent Lipid Panel Lab Results  Component Value Date/Time   CHOL 104 01/06/2019 08:01 AM   TRIG 83 01/06/2019 08:01 AM   HDL 32 (L) 01/06/2019 08:01 AM   CHOLHDL 3.3 01/06/2019 08:01 AM   CHOLHDL 3.6 12/29/2013  04:00 AM   LDLCALC 55 01/06/2019 08:01 AM    Wt Readings from Last 3 Encounters:  12/03/19 228 lb 12.8 oz (103.8 kg)  11/19/18 235 lb (106.6 kg)  11/10/18 230 lb (104.3 kg)     Objective:    Vital Signs:  BP 132/78   Pulse 64   Ht 6' (1.829 m)   Wt 228 lb 12.8 oz (103.8 kg)   SpO2 95%   BMI 31.03 kg/m    GEN: Well nourished, well developed in no acute distress HEENT: Normal NECK: No JVD; No carotid bruits LYMPHATICS: No lymphadenopathy CARDIAC:RRR, no murmurs, rubs, gallops RESPIRATORY:  Clear to auscultation without rales, wheezing or rhonchi  ABDOMEN: Soft, non-tender, non-distended MUSCULOSKELETAL:  No edema; No deformity  SKIN: Warm and dry NEUROLOGIC:  Alert and oriented x 3 PSYCHIATRIC:  Normal affect   EKG was performed today and showed atrial fibrillation with CVR at 64bpm and no ST changes  ASSESSMENT & PLAN:    1.  Permanent atrial fibrillation  -HR is controlled on exam today -continue Diltiazem 240mg  daily and Lopressor 25mg  BID -he has not had any bleeding problems on DOAC -continue apixaban 5mg  BID -SCr was 1.2 and Hbg 17.4 in May 2021  2.  Hypertension  -BP is well controlled -continue Diltiazem CD 240mg  daily, Lopressor 25mg  BID, chlorthalidone 12.5mg  daily and Valsartan 320mg  daily -SCr stable at 1.2  3.  Dilated ascending aorta  - this was normal on last echo 2017  4.  Coronary artery calcifications  -this was noted on prior Chest CT.   -2D echo 10/2018 with normal LVF  -Lexiscan myoview with no ischemia 10/2018 -he denies any anginal sx  5.  Type 2 Dm  - this is followed by his PCP.   -His last HbA1C was 6.2 a year ago.   -Continue Metformin 500mg  daily.   6.  Hyperlipidemia  - his LDL goal is < 70 due to DM and dilated aorta.  -LDL was 56 in May 2021 -continue atorvastatin 80mg  daily   Medication Adjustments/Labs and Tests Ordered: Current medicines are reviewed at length with the patient today.  Concerns regarding medicines are  outlined above.  Tests Ordered: No orders of the defined types were placed in this encounter.  Medication Changes: No orders of the defined types were placed in this encounter.   Disposition:  Follow up in 6 month(s) virtual visit  Signed, Fransico Him, MD  12/03/2019 9:36 AM    [place]

## 2019-12-03 ENCOUNTER — Ambulatory Visit: Payer: Medicare PPO | Admitting: Cardiology

## 2019-12-03 ENCOUNTER — Encounter: Payer: Self-pay | Admitting: Cardiology

## 2019-12-03 ENCOUNTER — Other Ambulatory Visit: Payer: Self-pay

## 2019-12-03 VITALS — BP 132/78 | HR 64 | Ht 72.0 in | Wt 228.8 lb

## 2019-12-03 DIAGNOSIS — I2510 Atherosclerotic heart disease of native coronary artery without angina pectoris: Secondary | ICD-10-CM

## 2019-12-03 DIAGNOSIS — E7800 Pure hypercholesterolemia, unspecified: Secondary | ICD-10-CM

## 2019-12-03 DIAGNOSIS — I10 Essential (primary) hypertension: Secondary | ICD-10-CM

## 2019-12-03 DIAGNOSIS — E1159 Type 2 diabetes mellitus with other circulatory complications: Secondary | ICD-10-CM

## 2019-12-03 DIAGNOSIS — I77810 Thoracic aortic ectasia: Secondary | ICD-10-CM

## 2019-12-03 DIAGNOSIS — I4821 Permanent atrial fibrillation: Secondary | ICD-10-CM | POA: Diagnosis not present

## 2019-12-03 NOTE — Patient Instructions (Signed)

## 2019-12-06 NOTE — Addendum Note (Signed)
Addended by: Gar Ponto on: 12/06/2019 07:01 AM   Modules accepted: Orders

## 2020-01-08 ENCOUNTER — Ambulatory Visit: Payer: Medicare PPO | Attending: Internal Medicine

## 2020-01-08 DIAGNOSIS — Z23 Encounter for immunization: Secondary | ICD-10-CM

## 2020-01-08 NOTE — Progress Notes (Signed)
   Covid-19 Vaccination Clinic  Name:  Daivion Pape III    MRN: [id number] DOB: 1946-01-21  01/08/2020  Mr. Whinery was observed post Covid-19 immunization for 15 minutes without incident. He was provided with Vaccine Information Sheet and instruction to access the V-Safe system.   Mr. Debes was instructed to call 911 with any severe reactions post vaccine: Marland Kitchen Difficulty breathing  . Swelling of face and throat  . A fast heartbeat  . A bad rash all over body  . Dizziness and weakness

## 2020-01-10 ENCOUNTER — Other Ambulatory Visit: Payer: Self-pay | Admitting: Cardiology

## 2020-01-27 DIAGNOSIS — Z0000 Encounter for general adult medical examination without abnormal findings: Secondary | ICD-10-CM | POA: Diagnosis not present

## 2020-01-27 DIAGNOSIS — I10 Essential (primary) hypertension: Secondary | ICD-10-CM | POA: Diagnosis not present

## 2020-01-27 DIAGNOSIS — Z8673 Personal history of transient ischemic attack (TIA), and cerebral infarction without residual deficits: Secondary | ICD-10-CM | POA: Diagnosis not present

## 2020-01-27 DIAGNOSIS — M19079 Primary osteoarthritis, unspecified ankle and foot: Secondary | ICD-10-CM | POA: Diagnosis not present

## 2020-01-27 DIAGNOSIS — Z1389 Encounter for screening for other disorder: Secondary | ICD-10-CM | POA: Diagnosis not present

## 2020-01-27 DIAGNOSIS — E1169 Type 2 diabetes mellitus with other specified complication: Secondary | ICD-10-CM | POA: Diagnosis not present

## 2020-01-27 DIAGNOSIS — I4891 Unspecified atrial fibrillation: Secondary | ICD-10-CM | POA: Diagnosis not present

## 2020-01-27 DIAGNOSIS — D6869 Other thrombophilia: Secondary | ICD-10-CM | POA: Diagnosis not present

## 2020-01-27 DIAGNOSIS — G4700 Insomnia, unspecified: Secondary | ICD-10-CM | POA: Diagnosis not present

## 2020-01-27 DIAGNOSIS — E782 Mixed hyperlipidemia: Secondary | ICD-10-CM | POA: Diagnosis not present

## 2020-03-07 ENCOUNTER — Other Ambulatory Visit: Payer: Self-pay | Admitting: Cardiology

## 2020-03-07 NOTE — Telephone Encounter (Signed)
Pt last saw Dr Radford Pax 12/03/19, last labs 01/27/20 Creat 1.28 at [facility] per [place], age 74, weight 103.8kg, based on specified criteria pt is on appropriate dosage of Eliquis 5mg  BID.  Will refill rx.

## 2020-03-28 DIAGNOSIS — M25511 Pain in right shoulder: Secondary | ICD-10-CM | POA: Diagnosis not present

## 2020-03-29 DIAGNOSIS — H401131 Primary open-angle glaucoma, bilateral, mild stage: Secondary | ICD-10-CM | POA: Diagnosis not present

## 2020-03-29 DIAGNOSIS — H52203 Unspecified astigmatism, bilateral: Secondary | ICD-10-CM | POA: Diagnosis not present

## 2020-03-29 DIAGNOSIS — Z961 Presence of intraocular lens: Secondary | ICD-10-CM | POA: Diagnosis not present

## 2020-03-29 DIAGNOSIS — E119 Type 2 diabetes mellitus without complications: Secondary | ICD-10-CM | POA: Diagnosis not present

## 2020-04-03 DIAGNOSIS — M25511 Pain in right shoulder: Secondary | ICD-10-CM | POA: Diagnosis not present

## 2020-04-03 DIAGNOSIS — M542 Cervicalgia: Secondary | ICD-10-CM | POA: Diagnosis not present

## 2020-04-05 ENCOUNTER — Telehealth: Payer: Self-pay | Admitting: Cardiology

## 2020-04-05 NOTE — Telephone Encounter (Signed)
Spoke with the patient who states that he has been getting dizzy when he changes positions or when he puts his hands above his head. He states that he does not feel like he is going to pass out, just very dizzy. He does not have any BP/HR readings when this occurs. He stats that his BP is usually well controlled. It was elevated when he saw his orthopedist and he also complained of the dizziness to them and they advised him to call us. Patient states that he is staying hydrated. Advised him to change positions slowly and monitor HR/BP during episodes.

## 2020-04-05 NOTE — Telephone Encounter (Signed)
STAT if patient feels like he/she is going to faint   1) Are you dizzy now? no  2) Do you feel faint or have you passed out? No but states it feels like the world is spinning.  3) Do you have any other symptoms? If he raises his hands up, if he stands up too fast or raises up from a laying down position  4) Have you checked your HR and BP (record if available)? States when he is at home it runs 120-125/70-75 then when he went to his Orthopedic doctor his BP was 159/99. He had expressed to his doctor that he was feeling dizzy and he was advised to call our office and schedule an appt. I don't see anything open until February, please advise if OK to wait that long.

## 2020-04-06 NOTE — Telephone Encounter (Signed)
Spoke with the patient and advised him to call his PCP. Patient verbalized understanding.

## 2020-04-06 NOTE — Telephone Encounter (Signed)
Sx sound like vertigo please get him in with his PCP

## 2020-04-07 DIAGNOSIS — R310 Gross hematuria: Secondary | ICD-10-CM | POA: Diagnosis not present

## 2020-04-07 DIAGNOSIS — M50821 Other cervical disc disorders at C4-C5 level: Secondary | ICD-10-CM | POA: Diagnosis not present

## 2020-04-07 DIAGNOSIS — N200 Calculus of kidney: Secondary | ICD-10-CM | POA: Diagnosis not present

## 2020-04-07 DIAGNOSIS — M5412 Radiculopathy, cervical region: Secondary | ICD-10-CM | POA: Diagnosis not present

## 2020-04-07 DIAGNOSIS — M6281 Muscle weakness (generalized): Secondary | ICD-10-CM | POA: Diagnosis not present

## 2020-04-11 DIAGNOSIS — M6281 Muscle weakness (generalized): Secondary | ICD-10-CM | POA: Diagnosis not present

## 2020-04-11 DIAGNOSIS — M50821 Other cervical disc disorders at C4-C5 level: Secondary | ICD-10-CM | POA: Diagnosis not present

## 2020-04-11 DIAGNOSIS — M5412 Radiculopathy, cervical region: Secondary | ICD-10-CM | POA: Diagnosis not present

## 2020-04-14 DIAGNOSIS — M5412 Radiculopathy, cervical region: Secondary | ICD-10-CM | POA: Diagnosis not present

## 2020-04-14 DIAGNOSIS — M6281 Muscle weakness (generalized): Secondary | ICD-10-CM | POA: Diagnosis not present

## 2020-04-14 DIAGNOSIS — M50821 Other cervical disc disorders at C4-C5 level: Secondary | ICD-10-CM | POA: Diagnosis not present

## 2020-04-18 DIAGNOSIS — M5412 Radiculopathy, cervical region: Secondary | ICD-10-CM | POA: Diagnosis not present

## 2020-04-18 DIAGNOSIS — M50821 Other cervical disc disorders at C4-C5 level: Secondary | ICD-10-CM | POA: Diagnosis not present

## 2020-04-18 DIAGNOSIS — M6281 Muscle weakness (generalized): Secondary | ICD-10-CM | POA: Diagnosis not present

## 2020-04-21 DIAGNOSIS — M5412 Radiculopathy, cervical region: Secondary | ICD-10-CM | POA: Diagnosis not present

## 2020-04-21 DIAGNOSIS — M6281 Muscle weakness (generalized): Secondary | ICD-10-CM | POA: Diagnosis not present

## 2020-04-21 DIAGNOSIS — M50821 Other cervical disc disorders at C4-C5 level: Secondary | ICD-10-CM | POA: Diagnosis not present

## 2020-04-26 DIAGNOSIS — M50821 Other cervical disc disorders at C4-C5 level: Secondary | ICD-10-CM | POA: Diagnosis not present

## 2020-04-26 DIAGNOSIS — M5412 Radiculopathy, cervical region: Secondary | ICD-10-CM | POA: Diagnosis not present

## 2020-04-26 DIAGNOSIS — M6281 Muscle weakness (generalized): Secondary | ICD-10-CM | POA: Diagnosis not present

## 2020-04-27 DIAGNOSIS — R310 Gross hematuria: Secondary | ICD-10-CM | POA: Diagnosis not present

## 2020-04-27 DIAGNOSIS — N200 Calculus of kidney: Secondary | ICD-10-CM | POA: Diagnosis not present

## 2020-05-01 DIAGNOSIS — M5412 Radiculopathy, cervical region: Secondary | ICD-10-CM | POA: Diagnosis not present

## 2020-05-01 DIAGNOSIS — M50821 Other cervical disc disorders at C4-C5 level: Secondary | ICD-10-CM | POA: Diagnosis not present

## 2020-05-01 DIAGNOSIS — M6281 Muscle weakness (generalized): Secondary | ICD-10-CM | POA: Diagnosis not present

## 2020-05-04 DIAGNOSIS — M6281 Muscle weakness (generalized): Secondary | ICD-10-CM | POA: Diagnosis not present

## 2020-05-04 DIAGNOSIS — M50821 Other cervical disc disorders at C4-C5 level: Secondary | ICD-10-CM | POA: Diagnosis not present

## 2020-05-04 DIAGNOSIS — M5412 Radiculopathy, cervical region: Secondary | ICD-10-CM | POA: Diagnosis not present

## 2020-05-05 DIAGNOSIS — R310 Gross hematuria: Secondary | ICD-10-CM | POA: Diagnosis not present

## 2020-05-05 DIAGNOSIS — K429 Umbilical hernia without obstruction or gangrene: Secondary | ICD-10-CM | POA: Diagnosis not present

## 2020-05-05 DIAGNOSIS — N281 Cyst of kidney, acquired: Secondary | ICD-10-CM | POA: Diagnosis not present

## 2020-05-05 DIAGNOSIS — N200 Calculus of kidney: Secondary | ICD-10-CM | POA: Diagnosis not present

## 2020-05-15 DIAGNOSIS — M50821 Other cervical disc disorders at C4-C5 level: Secondary | ICD-10-CM | POA: Diagnosis not present

## 2020-06-08 DIAGNOSIS — N35811 Other urethral stricture, male, meatal: Secondary | ICD-10-CM | POA: Diagnosis not present

## 2020-06-08 DIAGNOSIS — R310 Gross hematuria: Secondary | ICD-10-CM | POA: Diagnosis not present

## 2020-06-08 DIAGNOSIS — N200 Calculus of kidney: Secondary | ICD-10-CM | POA: Diagnosis not present

## 2020-06-14 ENCOUNTER — Encounter: Payer: Self-pay | Admitting: Cardiology

## 2020-06-14 ENCOUNTER — Other Ambulatory Visit: Payer: Self-pay

## 2020-06-14 ENCOUNTER — Ambulatory Visit: Payer: Medicare PPO | Admitting: Cardiology

## 2020-06-14 VITALS — BP 112/70 | HR 70 | Ht 72.0 in | Wt 223.0 lb

## 2020-06-14 DIAGNOSIS — I77810 Thoracic aortic ectasia: Secondary | ICD-10-CM

## 2020-06-14 DIAGNOSIS — E7800 Pure hypercholesterolemia, unspecified: Secondary | ICD-10-CM | POA: Diagnosis not present

## 2020-06-14 DIAGNOSIS — E1159 Type 2 diabetes mellitus with other circulatory complications: Secondary | ICD-10-CM

## 2020-06-14 DIAGNOSIS — I10 Essential (primary) hypertension: Secondary | ICD-10-CM | POA: Diagnosis not present

## 2020-06-14 DIAGNOSIS — I2510 Atherosclerotic heart disease of native coronary artery without angina pectoris: Secondary | ICD-10-CM | POA: Diagnosis not present

## 2020-06-14 DIAGNOSIS — I4821 Permanent atrial fibrillation: Secondary | ICD-10-CM | POA: Diagnosis not present

## 2020-06-14 NOTE — Addendum Note (Signed)
Addended by: Antonieta Iba on: 06/14/2020 09:32 AM   Modules accepted: Orders

## 2020-06-14 NOTE — Patient Instructions (Signed)
Medication Instructions:  Your physician recommends that you continue on your current medications as directed. Please refer to the Current Medication list given to you today.  *If you need a refill on your cardiac medications before your next appointment, please call your pharmacy*  Follow-Up: At [facility], you and your health needs are our priority.  As part of our continuing mission to provide you with exceptional heart care, we have created designated Provider Care Teams.  These Care Teams include your primary Cardiologist (physician) and Advanced Practice Providers (APPs -  Physician Assistants and Nurse Practitioners) who all work together to provide you with the care you need, when you need it.  Your next appointment:   1 year(s)  The format for your next appointment:   In Person  Provider:   You may see Fransico Him, MD or one of the following Advanced Practice Providers on your designated Care Team:    Melina Copa, PA-C  Ermalinda Barrios, PA-C    Other Instructions You have been referred to see an electrophysiologist - Dr. Quentin Ore

## 2020-06-14 NOTE — Progress Notes (Addendum)
Date:  06/14/2020   ID:  Norman [name], DOB 06/01/1945, MRN [id number]   PCP:  Antony Contras, MD  Cardiologist:  Fransico Him, MD  Electrophysiologist:  None   Chief Complaint:  Afib, CAD, HTN, HLD  History of Present Illness:    Norman [name] is a 75 y.o. male with a hx of permanentatrial fibrillation, carotid artery stenosis,coronary artery calcifications by CT,hyperlipidemia and dilated aortic root and HTN.  He is here today for followup and is doing well.  He denies any chest pain or pressure, SOB, DOE, PND, orthopnea, LE edema, palpitations or syncope.  He had some problems with vertigo which has resolved. He is compliant with his meds and is tolerating meds with no SE.    Prior CV studies:   The following studies were reviewed today:  None  Past Medical History:  Diagnosis Date  . Anxiety   . Blood in urine 2 1/2 WEEKS AGO  . BPH (benign prostatic hypertrophy)    Mild  . Coronary artery calcification seen on CAT scan 2011   no ischemia on nuclear stress test 2011  . Dilated aortic root ([place])    2mm by echo 2017  . Dysrhythmia    afib  . Epididymal cyst 2005   Bilateral and small bilateral hydroceles with most recent ultra sound  . Glaucoma   . History of kidney stones   . Hypercholesteremia   . Hypertension   . Kidney stones    "I've passed them all"  . Migraine    "got off caffeine; they went away"  . Permanent atrial fibrillation ([place])   . Stroke [facility])    TIA ? - 2009   . Tendinitis    History in Rotator cuff  . TIA (transient ischemic attack) ?12/28/2013  . Type II diabetes mellitus ([place])    Past Surgical History:  Procedure Laterality Date  . CARDIOVERSION  X 1  . CYSTOSCOPY WITH LITHOLAPAXY N/A 12/05/2017   Procedure: CYSTOSCOPY WITH LITHOLAPAXY;  Surgeon: Kathie Rhodes, MD;  Location: [facility];  Service: Urology;  Laterality: N/A;  . CYSTOSCOPY WITH RETROGRADE PYELOGRAM, URETEROSCOPY AND STENT PLACEMENT Right 08/18/2015   Procedure:  CYSTOSCOPY WITH RIGHT  RETROGRADE PYELOGRAM, RIGHT URETEROGRAM.;  Surgeon: Kathie Rhodes, MD;  Location: [facility];  Service: Urology;  Laterality: Right;  . TONSILLECTOMY       Current Meds  Medication Sig  . atorvastatin (LIPITOR) 80 MG tablet Take 80 mg by mouth every evening.   . chlorthalidone (HYGROTON) 25 MG tablet Take 12.5 mg by mouth daily.   . COMBIGAN 0.2-0.5 % ophthalmic solution Place 1 drop into both eyes 2 (two) times daily.  Marland Kitchen diltiazem (TIAZAC) 240 MG 24 hr capsule Take 1 capsule by mouth daily.  Marland Kitchen ELIQUIS 5 MG TABS tablet TAKE 1 TABLET BY MOUTH TWICE A DAY  . ezetimibe (ZETIA) 10 MG tablet TAKE ONE TABLET BY MOUTH ONE TIME DAILY  . LUMIGAN 0.01 % SOLN   . metFORMIN (GLUCOPHAGE) 500 MG tablet Take 500 mg by mouth daily with breakfast.  . metoprolol tartrate (LOPRESSOR) 25 MG tablet Take 25 mg by mouth 2 (two) times daily.  . valsartan (DIOVAN) 320 MG tablet Take 320 mg by mouth daily.  Marland Kitchen zolpidem (AMBIEN CR) 12.5 MG CR tablet Take 12.5 mg by mouth at bedtime.     Allergies:   Patient has no known allergies.   Social History   Tobacco Use  . Smoking status: Never Smoker  .  Smokeless tobacco: Never Used  Vaping Use  . Vaping Use: Never used  Substance Use Topics  . Alcohol use: Yes    Alcohol/week: 1.0 standard drink    Types: 1 Glasses of wine per week    Comment: occ  . Drug use: No     Family Hx: The patient's family history includes Diabetes in his maternal aunt; Heart attack in his father; Kidney failure in his mother.  ROS:   Please see the history of present illness.     All other systems reviewed and are negative.   Labs/Other Tests and Data Reviewed:    Recent Labs: No results found for requested labs within last 8760 hours.   Recent Lipid Panel Lab Results  Component Value Date/Time   CHOL 104 01/06/2019 08:01 AM   TRIG 83 01/06/2019 08:01 AM   HDL 32 (L) 01/06/2019 08:01 AM   CHOLHDL 3.3 01/06/2019 08:01 AM   CHOLHDL 3.6 12/29/2013 04:00  AM   LDLCALC 55 01/06/2019 08:01 AM    Wt Readings from Last 3 Encounters:  06/14/20 223 lb (101.2 kg)  12/03/19 228 lb 12.8 oz (103.8 kg)  11/19/18 235 lb (106.6 kg)     Objective:    Vital Signs:  BP 112/70 (BP Location: Left Arm, Patient Position: Sitting, Cuff Size: Normal)   Pulse 70   Ht 6' (1.829 m)   Wt 223 lb (101.2 kg)   SpO2 98%   BMI 30.24 kg/m    GEN: Well nourished, well developed in no acute distress HEENT: Normal NECK: No JVD; No carotid bruits LYMPHATICS: No lymphadenopathy CARDIAC:irregularly irregular, no murmurs, rubs, gallops RESPIRATORY:  Clear to auscultation without rales, wheezing or rhonchi  ABDOMEN: Soft, non-tender, non-distended MUSCULOSKELETAL:  No edema; No deformity  SKIN: Warm and dry NEUROLOGIC:  Alert and oriented x 3 PSYCHIATRIC:  Normal affect    ASSESSMENT & PLAN:    1.  Permanent atrial fibrillation  -he is well rate controlled on exam -continue Diltiazem 240mg  daily and Lopressor 25mg  BID -he has not had any bleeding problems on DOAC -continue apixaban 5mg  BID -SCr was 1 in Feb 2022 and Hbg 16.5 in Nov 2022 -he is having prostate problems and recently had a cystoscopy with TURP but is still having hematuria.  HIs Urologist would like him to come off anticoagulation for 5-7 days.  Unfortunately is has a hx of CVA in the past and is in chronic atrial fibrillation so his CHADS2VASC score is high which places him at high risk of cardioembolic events off anticoagulation.  Needs to assess risk/benefit of being off anticoagulation as he is at risk of CVA if off anticoagulation for more than 48 hours -we did discuss the Watchman device so that he does not have to deal with anticoagulation going forward and I will refer him to Dr. Quentin Ore -I have seen Norman [name] is a 75 y.o. male in the office today. The patient is felt to be a poor candidate for long-term anticoagulation because of ongoing hematuria.  Their CHADS-2-Vasc Score and  unadjusted Ischemic Stroke Rate (% per year) is equal to 11.2 % stroke rate/year from a score of 7, necessitating a strategy of stroke prevention with either long-term oral anticoagulation or left atrial appendage occlusion therapy. We have discussed their bleeding risk in the context of their comorbid medical problems, as well as the rationale for referral for evaluation of Watchman left atrial appendage occlusion therapy. While the patient is at high long-term bleeding risk,  they may be appropriate for short-term anticoagulation. Based on this individual patient's stroke and bleeding risk, a shared decision has been made to refer the patient for consideration of Watchman left atrial appendage closure utilizing the [employer] of Cardiology shared decision tool.  2.  Hypertension  -BP is adequately controlled on exam today -continue Diltiazem CD 240mg  daily, Lopressor 25mg  BID, chlorthalidone 12.5mg  daily and Valsartan 320mg  daily -SCr stable at 1 and K+ 4.4  3.  Dilated ascending aorta  - this was normal on last echo 2017  4.  Coronary artery calcifications  -this was noted on prior Chest CT.   -2D echo 10/2018 with normal LVF  -Lexiscan myoview with no ischemia 10/2018 -he has not had any anginal symptomx  5.  Type 2 Dm  - this is followed by his PCP.   -His last HbA1C was 6.4 in Nov 2021 -Continue Metformin 500mg  daily.   6.  Hyperlipidemia  - his LDL goal is < 70 due to DM and dilated aorta.  -LDL was 20 in Nov 2021 -continue atorvastatin 80mg  daily   Followup with me in 1 year  Medication Adjustments/Labs and Tests Ordered: Current medicines are reviewed at length with the patient today.  Concerns regarding medicines are outlined above.  Tests Ordered: No orders of the defined types were placed in this encounter.  Medication Changes: No orders of the defined types were placed in this encounter.   Disposition:  Follow up in 6 month(s) virtual  visit  Signed, Fransico Him, MD  06/14/2020 9:12 AM    [place]

## 2020-06-29 ENCOUNTER — Other Ambulatory Visit: Payer: Self-pay

## 2020-06-29 ENCOUNTER — Encounter: Payer: Self-pay | Admitting: Cardiology

## 2020-06-29 ENCOUNTER — Ambulatory Visit: Payer: Medicare PPO | Admitting: Cardiology

## 2020-06-29 VITALS — BP 128/74 | HR 53 | Ht 72.0 in | Wt 225.2 lb

## 2020-06-29 DIAGNOSIS — I639 Cerebral infarction, unspecified: Secondary | ICD-10-CM

## 2020-06-29 DIAGNOSIS — I4821 Permanent atrial fibrillation: Secondary | ICD-10-CM | POA: Diagnosis not present

## 2020-06-29 DIAGNOSIS — I10 Essential (primary) hypertension: Secondary | ICD-10-CM | POA: Diagnosis not present

## 2020-06-29 DIAGNOSIS — R319 Hematuria, unspecified: Secondary | ICD-10-CM

## 2020-06-29 NOTE — Patient Instructions (Signed)
Medication Instructions:  Your physician recommends that you continue on your current medications as directed. Please refer to the Current Medication list given to you today. *If you need a refill on your cardiac medications before your next appointment, please call your pharmacy*  Lab Work: You will get a BMP on the same day as your ECHO.  Please schedule same day.  If you have labs (blood work) drawn today and your tests are completely normal, you will receive your results only by: Marland Kitchen MyChart Message (if you have MyChart) OR . A paper copy in the mail If you have any lab test that is abnormal or we need to change your treatment, we will call you to review the results.  Testing/Procedures: Your physician has requested that you have an echocardiogram. Echocardiography is a painless test that uses sound waves to create images of your heart. It provides your doctor with information about the size and shape of your heart and how well your heart's chambers and valves are working. This procedure takes approximately one hour. There are no restrictions for this procedure.  Please schedule ECHO  Follow-Up:  Your next appointment will be at the afib clinic:  AFIB CLINIC INFORMATION: Your appointment is scheduled on: ____________ at ____________ The AFib Clinic is located in the Heart and Vascular Specialty Clinics at [facility]. Parking instructions/directions: [address] (off [address]). When you pull in to Entrance C,  there is an underground parking garage to your right. The code to enter the garage is _________________. Take the  elevators to the first floor. Follow the signs to the Heart and Vascular Specialty Clinics. You will see  registration at the end of the hallway.  Phone number: [phone]   Left Atrial Appendage Closure Device Implantation  Left atrial appendage (LAA) closure device implantation is a procedure to put a small device in the LAA of the heart. The LAA  is a small sac in the wall of the heart's left upper chamber. Blood clots can form in the LAA in people with atrial fibrillation (AFib). The device closes the LAA to help prevent a blood clot and stroke.

## 2020-06-29 NOTE — Progress Notes (Signed)
Electrophysiology Office Note:    Date:  06/29/2020   ID:  Norman [name], DOB [birth year], MRN [id number]  PCP:  Antony Contras, MD  [facility] Cardiologist:  Fransico Him, MD  [facility] Electrophysiologist:  Vickie Epley, MD   Referring MD: Sueanne Margarita, MD   Chief Complaint: Permanent atrial fibrillation  History of Present Illness:    Norman [name] is a 75 y.o. male who presents for an evaluation of permanent atrial fibrillation at the request of Dr. Radford Pax. Their medical history includes carotid artery stenosis, CVA, coronary artery calcifications on CT, hyperlipidemia, hypertension and hematuria secondary to a bleeding prostate.  He last saw Dr. Radford Pax on June 14, 2020.  For his atrial fibrillation he is rate controlled on diltiazem and Lopressor.  The patient has had recurrent issues with materia.  His urologist is planning his procedure during which she would need to be off his anticoagulation for least 5 to 7 days.  Given his history of stroke, the patient would like to pursue a strategy to help prevent recurrent episode of stroke while avoiding long-term exposure anticoagulation.  He is active.  Past Medical History:  Diagnosis Date  . Anxiety   . Blood in urine 2 1/2 WEEKS AGO  . BPH (benign prostatic hypertrophy)    Mild  . Coronary artery calcification seen on CAT scan 2011   no ischemia on nuclear stress test 2011  . Dilated aortic root ([place])    10mm by echo 2017  . Dysrhythmia    afib  . Epididymal cyst 2005   Bilateral and small bilateral hydroceles with most recent ultra sound  . Glaucoma   . History of kidney stones   . Hypercholesteremia   . Hypertension   . Kidney stones    "I've passed them all"  . Migraine    "got off caffeine; they went away"  . Permanent atrial fibrillation ([place])   . Stroke [facility])    TIA ? - 2009   . Tendinitis    History in Rotator cuff  . TIA (transient ischemic attack) ?12/28/2013  . Type II  diabetes mellitus ([place])     Past Surgical History:  Procedure Laterality Date  . CARDIOVERSION  X 1  . CYSTOSCOPY WITH LITHOLAPAXY N/A 12/05/2017   Procedure: CYSTOSCOPY WITH LITHOLAPAXY;  Surgeon: Kathie Rhodes, MD;  [facility];  Service: Urology;  Laterality: N/A;  . CYSTOSCOPY WITH RETROGRADE PYELOGRAM, URETEROSCOPY AND STENT PLACEMENT Right 08/18/2015   Procedure: CYSTOSCOPY WITH RIGHT  RETROGRADE PYELOGRAM, RIGHT URETEROGRAM.;  Surgeon: Kathie Rhodes, MD;  Location[facility];  Service: Urology;  Laterality: Right;  . TONSILLECTOMY      Current Medications: Current Meds  Medication Sig  . atorvastatin (LIPITOR) 80 MG tablet Take 80 mg by mouth every evening.   . chlorthalidone (HYGROTON) 25 MG tablet Take 12.5 mg by mouth daily.   . COMBIGAN 0.2-0.5 % ophthalmic solution Place 1 drop into both eyes 2 (two) times daily.  Marland Kitchen diltiazem (TIAZAC) 240 MG 24 hr capsule Take 1 capsule by mouth daily.  Marland Kitchen ELIQUIS 5 MG TABS tablet TAKE 1 TABLET BY MOUTH TWICE A DAY  . ezetimibe (ZETIA) 10 MG tablet TAKE ONE TABLET BY MOUTH ONE TIME DAILY  . LUMIGAN 0.01 % SOLN   . metFORMIN (GLUCOPHAGE) 500 MG tablet Take 500 mg by mouth daily with breakfast.  . metoprolol tartrate (LOPRESSOR) 25 MG tablet Take 25 mg by mouth 2 (two) times daily.  Marland Kitchen  valsartan (DIOVAN) 320 MG tablet Take 320 mg by mouth daily.  Marland Kitchen zolpidem (AMBIEN CR) 12.5 MG CR tablet Take 12.5 mg by mouth at bedtime.     Allergies:   Patient has no known allergies.   Social History   Socioeconomic History  . Marital status: Married    Spouse name: Not on file  . Number of children: 2  . Years of education: college  . Highest education level: Not on file  Occupational History  . Not on file  Tobacco Use  . Smoking status: Never Smoker  . Smokeless tobacco: Never Used  Vaping Use  . Vaping Use: Never used  Substance and Sexual Activity  . Alcohol use: Yes    Alcohol/week: 1.0 standard drink    Types: 1 Glasses of wine per week     Comment: occ  . Drug use: No  . Sexual activity: Not on file  Other Topics Concern  . Not on file  Social History Narrative   Patient is married with 2 children.   Patient is right handed.   Patient has college education.   Patient drinks very little caffeine.   Social Determinants of Health   Financial Resource Strain: Not on file  Food Insecurity: Not on file  Transportation Needs: Not on file  Physical Activity: Not on file  Stress: Not on file  Social Connections: Not on file     Family History: The patient's family history includes Diabetes in his maternal aunt; Heart attack in his father; Kidney failure in his mother.  ROS:   Please see the history of present illness.    All other systems reviewed and are negative.  EKGs/Labs/Other Studies Reviewed:    The following studies were reviewed today:  November 19, 2018 SPECT  There was no ST segment deviation noted during stress.  Nuclear stress EF: 62%.  The left ventricular ejection fraction is normal (55-65%).  The study is normal.  This is a low risk study.  November 19, 2018 echo personally reviewed Left ventricular function normal, 60% Mildly dilated left atrium   EKG:  The ekg ordered today demonstrates atrial fibrillation  Recent Labs: No results found for requested labs within last 8760 hours.  Recent Lipid Panel    Component Value Date/Time   CHOL 104 01/06/2019 0801   TRIG 83 01/06/2019 0801   HDL 32 (L) 01/06/2019 0801   CHOLHDL 3.3 01/06/2019 0801   CHOLHDL 3.6 12/29/2013 0400   VLDL 22 12/29/2013 0400   LDLCALC 55 01/06/2019 0801    Physical Exam:    VS:  BP 128/74   Pulse (!) 53   Ht 6' (1.829 m)   Wt 225 lb 3.2 oz (102.2 kg)   SpO2 96%   BMI 30.54 kg/m     Wt Readings from Last 3 Encounters:  06/29/20 225 lb 3.2 oz (102.2 kg)  06/14/20 223 lb (101.2 kg)  12/03/19 228 lb 12.8 oz (103.8 kg)     GEN:  Well nourished, well developed in no acute distress.  Appears younger  than stated age. HEENT: Normal NECK: No JVD; No carotid bruits LYMPHATICS: No lymphadenopathy CARDIAC: Irregularly irregular, no murmurs, rubs, gallops RESPIRATORY:  Clear to auscultation without rales, wheezing or rhonchi  ABDOMEN: Soft, non-tender, non-distended MUSCULOSKELETAL:  No edema; No deformity  SKIN: Warm and dry NEUROLOGIC:  Alert and oriented x 3 PSYCHIATRIC:  Normal affect   ASSESSMENT:    1. Permanent atrial fibrillation ([place])   2. Primary hypertension  3. Cerebral infarction, unspecified mechanism ([place])   4. Hematuria, unspecified type    PLAN:    In order of problems listed above:  1. Permanent atrial fibrillation Rate controlled on Cardizem and metoprolol.  Currently is tolerating Eliquis 5 mg by mouth twice daily.  Has had difficulty with hematuria and is working with the urologist.  He would like to pursue a strategy for long-term secondary stroke prophylaxis that avoid long-term exposure anticoagulation.  I think is a very reasonable candidate for the watchman procedure.  I discussed the watchman procedure in detail during today's visit including the risks, expected recovery time and need for short-term anticoagulation.  He would like to proceed with scheduling.  I have seen Marko Stai [name] in the office today who is being considered for a Watchman left atrial appendage closure device. I believe they will benefit from this procedure given their history of atrial fibrillation, CHA2DS2-VASc score of 6 and unadjusted ischemic stroke rate of 9.7% per year. Unfortunately, the patient is not felt to be a long term anticoagulation candidate secondary to hematuria. The patient's chart has been reviewed and I feel that they would be a candidate for short term oral anticoagulation after Watchman implant.   It is my belief that after undergoing a LAA closure procedure, Marko Stai [name] will not need long term anticoagulation which eliminates anticoagulation side  effects and major bleeding risk.   Procedural risks for the Watchman implant have been reviewed with the patient including a 0.5% risk of stroke, <1% risk of perforation and <1% risk of device embolization.    The published clinical data on the safety and effectiveness of WATCHMAN include but are not limited to the following: - Holmes DR, Mechele Claude, Sick P et al. for the PROTECT AF Investigators. Percutaneous closure of the left atrial appendage versus warfarin therapy for prevention of stroke in patients with atrial fibrillation: a randomised non-inferiority trial. Lancet 2009; 374: 534-42. Mechele Claude, Doshi SK, Abelardo Diesel D et al. on behalf of the PROTECT AF Investigators. Percutaneous Left Atrial Appendage Closure for Stroke Prophylaxis in Patients With Atrial Fibrillation 2.3-Year Follow-up of the PROTECT AF (Watchman Left Atrial Appendage System for Embolic Protection in Patients With Atrial Fibrillation) Trial. Circulation 2013; 127:720-729. - Alli O, Doshi S,  Kar S, Reddy VY, Sievert H et al. Quality of Life Assessment in the Randomized PROTECT AF (Percutaneous Closure of the Left Atrial Appendage Versus Warfarin Therapy for Prevention of Stroke in Patients With Atrial Fibrillation) Trial of Patients at Risk for Stroke With Nonvalvular Atrial Fibrillation. J Am Coll Cardiol 2013; 42:6834-1. Vertell Limber DR, Tarri Abernethy, Price M, [place], Sievert H, Doshi S, Huber K, Reddy V. Prospective randomized evaluation of the Watchman left atrial appendage Device in patients with atrial fibrillation versus long-term warfarin therapy; the PREVAIL trial. Journal of the [employer] of Cardiology, Vol. 4, No. 1, 2014, 1-11. - Kar S, Doshi SK, Sadhu A, Horton R, Osorio J et al. Primary outcome evaluation of a next-generation left atrial appendage closure device: results from the PINNACLE FLX trial. Circulation 2021;143(18)1754-1762.    After today's visit with the patient which was dedicated solely for  shared decision making visit regarding LAA closure device, the patient decided to proceed with the LAA appendage closure procedure scheduled to be done in the near future at [facility]. Prior to the procedure, I would like to obtain a gated CT scan of the chest with contrast timed for PV/LA visualization.  Additionally, the patient will need an updated echocardiogram.  2.  Hypertension Controlled on current regimen   Total time spent with patient today 65 minutes. This includes reviewing records, evaluating the patient and coordinating care.  Medication Adjustments/Labs and Tests Ordered: Current medicines are reviewed at length with the patient today.  Concerns regarding medicines are outlined above.  Orders Placed This Encounter  Procedures  . Basic Metabolic Panel (BMET)  . EKG 12-Lead  . ECHOCARDIOGRAM COMPLETE   No orders of the defined types were placed in this encounter.    Signed, Lars Mage, MD, [facility], [facility]  06/29/2020 7:46 PM    Electrophysiology [facility]

## 2020-07-03 NOTE — Addendum Note (Signed)
Addended by: Willeen Cass A on: 07/03/2020 03:04 PM   Modules accepted: Orders

## 2020-07-06 ENCOUNTER — Telehealth: Payer: Self-pay | Admitting: Cardiology

## 2020-07-06 ENCOUNTER — Telehealth: Payer: Self-pay

## 2020-07-06 NOTE — Telephone Encounter (Signed)
Left detailed message for Pt.  Pt needs to have lab work rescheduled for prior to cardiac CT.  Cardiac CT is scheduled for 07/17/20  When Pt calls back-please reschedule lab work.

## 2020-07-06 NOTE — Telephone Encounter (Signed)
WHEN PT CALLS BACK PLEASE-find out what date he can come in for labs next week and reschedule.

## 2020-07-06 NOTE — Telephone Encounter (Signed)
Duplicate phone note.

## 2020-07-06 NOTE — Telephone Encounter (Signed)
Pt rescheduled for lab work.

## 2020-07-06 NOTE — Telephone Encounter (Signed)
Me     07/06/20 9:04 AM Note Left detailed message for Pt.  Pt needs to have lab work rescheduled for prior to cardiac CT.  Cardiac CT is scheduled for 07/17/20  When Pt calls back-please reschedule lab work.

## 2020-07-06 NOTE — Telephone Encounter (Signed)
Pt is returning call to [employer]. Pt states he is needing a Lab order entered before his CT that is scheduled for 07/17/20, I see labwork order for ECHO. Please advise

## 2020-07-10 ENCOUNTER — Other Ambulatory Visit: Payer: Medicare PPO | Admitting: *Deleted

## 2020-07-10 ENCOUNTER — Other Ambulatory Visit: Payer: Self-pay

## 2020-07-10 DIAGNOSIS — I4821 Permanent atrial fibrillation: Secondary | ICD-10-CM

## 2020-07-10 LAB — BASIC METABOLIC PANEL
BUN/Creatinine Ratio: 12 (ref 10–24)
BUN: 13 mg/dL (ref 8–27)
CO2: 30 mmol/L — ABNORMAL HIGH (ref 20–29)
Calcium: 9.8 mg/dL (ref 8.6–10.2)
Chloride: 96 mmol/L (ref 96–106)
Creatinine, Ser: 1.11 mg/dL (ref 0.76–1.27)
Glucose: 269 mg/dL — ABNORMAL HIGH (ref 65–99)
Potassium: 4.1 mmol/L (ref 3.5–5.2)
Sodium: 135 mmol/L (ref 134–144)
eGFR: 68 mL/min/{1.73_m2} (ref >59)

## 2020-07-13 ENCOUNTER — Telehealth (HOSPITAL_COMMUNITY): Payer: Self-pay | Admitting: *Deleted

## 2020-07-13 NOTE — Telephone Encounter (Signed)
Pt returning call regarding upcoming cardiac imaging study; pt verbalizes understanding of appt date/time, parking situation and where to check in, pre-test NPO status and medications ordered, and verified current allergies; name and call back number provided for further questions should they arise  Jaydyn Menon RN Navigator Cardiac Imaging [facility] [phone] office [phone] cell  

## 2020-07-13 NOTE — Telephone Encounter (Signed)
Attempted to call patient regarding upcoming cardiac CT appointment. °Left message on voicemail with name and callback number ° °Raynald Rouillard RN Navigator Cardiac Imaging °[facility] °[phone] Office °[phone] Cell ° °

## 2020-07-17 ENCOUNTER — Other Ambulatory Visit: Payer: Self-pay

## 2020-07-17 ENCOUNTER — Ambulatory Visit (HOSPITAL_COMMUNITY)
Admission: RE | Admit: 2020-07-17 | Discharge: 2020-07-17 | Disposition: A | Discharge status: Home / Self Care | Payer: Medicare PPO | Source: Ambulatory Visit | Attending: Cardiology | Admitting: Cardiology

## 2020-07-17 DIAGNOSIS — I4821 Permanent atrial fibrillation: Secondary | ICD-10-CM | POA: Diagnosis not present

## 2020-07-17 DIAGNOSIS — R319 Hematuria, unspecified: Secondary | ICD-10-CM | POA: Insufficient documentation

## 2020-07-17 DIAGNOSIS — I639 Cerebral infarction, unspecified: Secondary | ICD-10-CM | POA: Insufficient documentation

## 2020-07-17 DIAGNOSIS — I10 Essential (primary) hypertension: Secondary | ICD-10-CM | POA: Insufficient documentation

## 2020-07-17 MED ORDER — IOHEXOL 350 MG/ML SOLN
80.0000 mL | Freq: Once | INTRAVENOUS | Status: AC | PRN
  Administered 2020-07-17: 80 mL via INTRAVENOUS

## 2020-07-18 ENCOUNTER — Telehealth (HOSPITAL_COMMUNITY): Payer: Self-pay | Admitting: Nurse Practitioner

## 2020-07-18 NOTE — Telephone Encounter (Signed)
My Chart

## 2020-07-18 NOTE — Telephone Encounter (Signed)
Call returned this morning by Mr.Manganiello and patient has agreed to have Watchman implanted on 08/03/2020. He has been updated and notified of all appointments and additional steps in the procedure process.Mr. Delair agrees with plan and all questions answered at this time. Contact information shared and patient encouraged to call with any addition questions.   Ambrose Pancoast, RN Watchman Coordinator

## 2020-07-18 NOTE — Telephone Encounter (Signed)
Call placed this morning to discuss CT results and next steps in Watchman process. Patient not available at this time and detailed voice mail left with call back number.  Ambrose Pancoast, RN Watchman Coordinator

## 2020-07-27 DIAGNOSIS — N200 Calculus of kidney: Secondary | ICD-10-CM | POA: Diagnosis not present

## 2020-07-27 DIAGNOSIS — R310 Gross hematuria: Secondary | ICD-10-CM | POA: Diagnosis not present

## 2020-07-27 DIAGNOSIS — Q541 Hypospadias, penile: Secondary | ICD-10-CM | POA: Diagnosis not present

## 2020-07-31 ENCOUNTER — Encounter (HOSPITAL_COMMUNITY): Payer: Self-pay | Admitting: Physician Assistant

## 2020-07-31 ENCOUNTER — Ambulatory Visit (HOSPITAL_COMMUNITY)
Admission: RE | Admit: 2020-07-31 | Discharge: 2020-07-31 | Disposition: A | Discharge status: Home / Self Care | Payer: Medicare PPO | Source: Ambulatory Visit | Attending: Physician Assistant | Admitting: Physician Assistant

## 2020-07-31 ENCOUNTER — Other Ambulatory Visit: Payer: Self-pay

## 2020-07-31 ENCOUNTER — Ambulatory Visit (HOSPITAL_BASED_OUTPATIENT_CLINIC_OR_DEPARTMENT_OTHER): Payer: Medicare PPO

## 2020-07-31 ENCOUNTER — Other Ambulatory Visit: Payer: Medicare PPO

## 2020-07-31 ENCOUNTER — Ambulatory Visit (HOSPITAL_BASED_OUTPATIENT_CLINIC_OR_DEPARTMENT_OTHER)
Admission: RE | Admit: 2020-07-31 | Discharge: 2020-07-31 | Disposition: A | Discharge status: Home / Self Care | Payer: Medicare PPO | Source: Ambulatory Visit | Attending: Physician Assistant | Admitting: Physician Assistant

## 2020-07-31 VITALS — BP 128/88 | HR 52 | Ht 72.0 in | Wt 222.2 lb

## 2020-07-31 DIAGNOSIS — E785 Hyperlipidemia, unspecified: Secondary | ICD-10-CM | POA: Diagnosis present

## 2020-07-31 DIAGNOSIS — I081 Rheumatic disorders of both mitral and tricuspid valves: Secondary | ICD-10-CM | POA: Insufficient documentation

## 2020-07-31 DIAGNOSIS — I4821 Permanent atrial fibrillation: Secondary | ICD-10-CM

## 2020-07-31 DIAGNOSIS — Z7901 Long term (current) use of anticoagulants: Secondary | ICD-10-CM | POA: Insufficient documentation

## 2020-07-31 DIAGNOSIS — Z833 Family history of diabetes mellitus: Secondary | ICD-10-CM | POA: Diagnosis not present

## 2020-07-31 DIAGNOSIS — I4891 Unspecified atrial fibrillation: Secondary | ICD-10-CM | POA: Diagnosis present

## 2020-07-31 DIAGNOSIS — I10 Essential (primary) hypertension: Secondary | ICD-10-CM | POA: Diagnosis present

## 2020-07-31 DIAGNOSIS — E669 Obesity, unspecified: Secondary | ICD-10-CM | POA: Insufficient documentation

## 2020-07-31 DIAGNOSIS — I2510 Atherosclerotic heart disease of native coronary artery without angina pectoris: Secondary | ICD-10-CM | POA: Diagnosis present

## 2020-07-31 DIAGNOSIS — Z7984 Long term (current) use of oral hypoglycemic drugs: Secondary | ICD-10-CM | POA: Diagnosis not present

## 2020-07-31 DIAGNOSIS — N400 Enlarged prostate without lower urinary tract symptoms: Secondary | ICD-10-CM | POA: Diagnosis present

## 2020-07-31 DIAGNOSIS — F419 Anxiety disorder, unspecified: Secondary | ICD-10-CM | POA: Diagnosis present

## 2020-07-31 DIAGNOSIS — I6529 Occlusion and stenosis of unspecified carotid artery: Secondary | ICD-10-CM | POA: Insufficient documentation

## 2020-07-31 DIAGNOSIS — I340 Nonrheumatic mitral (valve) insufficiency: Secondary | ICD-10-CM | POA: Diagnosis not present

## 2020-07-31 DIAGNOSIS — Z7182 Exercise counseling: Secondary | ICD-10-CM | POA: Insufficient documentation

## 2020-07-31 DIAGNOSIS — Z6830 Body mass index (BMI) 30.0-30.9, adult: Secondary | ICD-10-CM | POA: Diagnosis not present

## 2020-07-31 DIAGNOSIS — H409 Unspecified glaucoma: Secondary | ICD-10-CM | POA: Diagnosis present

## 2020-07-31 DIAGNOSIS — D6869 Other thrombophilia: Secondary | ICD-10-CM | POA: Diagnosis present

## 2020-07-31 DIAGNOSIS — I517 Cardiomegaly: Secondary | ICD-10-CM | POA: Diagnosis not present

## 2020-07-31 DIAGNOSIS — Z006 Encounter for examination for normal comparison and control in clinical research program: Secondary | ICD-10-CM | POA: Diagnosis not present

## 2020-07-31 DIAGNOSIS — Z79899 Other long term (current) drug therapy: Secondary | ICD-10-CM | POA: Insufficient documentation

## 2020-07-31 DIAGNOSIS — Z8673 Personal history of transient ischemic attack (TIA), and cerebral infarction without residual deficits: Secondary | ICD-10-CM | POA: Diagnosis not present

## 2020-07-31 DIAGNOSIS — E119 Type 2 diabetes mellitus without complications: Secondary | ICD-10-CM | POA: Insufficient documentation

## 2020-07-31 DIAGNOSIS — Z20822 Contact with and (suspected) exposure to covid-19: Secondary | ICD-10-CM | POA: Diagnosis present

## 2020-07-31 LAB — BASIC METABOLIC PANEL
Anion gap: 7 (ref 5–15)
BUN: 16 mg/dL (ref 8–23)
CO2: 26 mmol/L (ref 22–32)
Calcium: 9.3 mg/dL (ref 8.9–10.3)
Chloride: 101 mmol/L (ref 98–111)
Creatinine, Ser: 1.14 mg/dL (ref 0.61–1.24)
GFR, Estimated: >60 mL/min (ref >60)
Glucose, Bld: 140 mg/dL — ABNORMAL HIGH (ref 70–99)
Potassium: 3.7 mmol/L (ref 3.5–5.1)
Sodium: 134 mmol/L — ABNORMAL LOW (ref 135–145)

## 2020-07-31 LAB — CBC
HCT: 50.3 % (ref 39.0–52.0)
Hemoglobin: 16.6 g/dL (ref 13.0–17.0)
MCH: 32 pg (ref 26.0–34.0)
MCHC: 33 g/dL (ref 30.0–36.0)
MCV: 96.9 fL (ref 80.0–100.0)
Platelets: 213 10*3/uL (ref 150–400)
RBC: 5.19 MIL/uL (ref 4.22–5.81)
RDW: 13.2 % (ref 11.5–15.5)
WBC: 7.5 10*3/uL (ref 4.0–10.5)
nRBC: 0 % (ref 0.0–0.2)

## 2020-07-31 LAB — ECHOCARDIOGRAM COMPLETE
Height: 72 in
S' Lateral: 2.5 cm
Weight: 3555.2 oz

## 2020-07-31 MED ORDER — ASPIRIN EC 81 MG PO TBEC: 81.0000 mg | DELAYED_RELEASE_TABLET | Freq: Every day | ORAL | 2 refills | Status: AC

## 2020-07-31 NOTE — Progress Notes (Signed)
Primary Care Physician: Antony Contras, MD Primary Cardiologist: Dr Radford Pax Primary Electrophysiologist: Dr Quentin Ore  Referring Physician: Dr Penni Homans [name] is a 75 y.o. male with a history of carotid artery stenosis, DM, prior CVA, CAD, HLD, HTN, atrial fibrillation who presents for follow up in the [facility]. Patient is on Eliquis for a CHADS2VASC score of 7. Patient was seen by Dr Quentin Ore for [facility] device consideration. He is not felt to be a candidate for long term anticoagulation due to hematuria. He is scheduled for Watchman implant 08/03/20. He is in rate controlled afib today and asymptomatic.   Today, he denies symptoms of palpitations, chest pain, shortness of breath, orthopnea, PND, lower extremity edema, dizziness, presyncope, syncope, snoring, daytime somnolence, bleeding, or neurologic sequela. The patient is tolerating medications without difficulties and is otherwise without complaint today.    Atrial Fibrillation Risk Factors:  he does not have symptoms or diagnosis of sleep apnea. he does not have a history of rheumatic fever.   he has a BMI of Body mass index is 30.14 kg/m.Marland Kitchen Filed Weights   07/31/20 0844  Weight: 100.8 kg    Family History  Problem Relation Age of Onset  . Kidney failure Mother   . Heart attack Father        stenosis  . Diabetes Maternal Aunt      Atrial Fibrillation Management history:  Previous antiarrhythmic drugs: none Previous cardioversions: remotely  Previous ablations: none CHADS2VASC score: 7 Anticoagulation history: Eliquis   Past Medical History:  Diagnosis Date  . Anxiety   . Blood in urine 2 1/2 WEEKS AGO  . BPH (benign prostatic hypertrophy)    Mild  . Coronary artery calcification seen on CAT scan 2011   no ischemia on nuclear stress test 2011  . Dilated aortic root ([place])    48mm by echo 2017  . Dysrhythmia    afib  . Epididymal cyst 2005   Bilateral and small  bilateral hydroceles with most recent ultra sound  . Glaucoma   . History of kidney stones   . Hypercholesteremia   . Hypertension   . Kidney stones    "I've passed them all"  . Migraine    "got off caffeine; they went away"  . Permanent atrial fibrillation ([place])   . Stroke [facility])    TIA ? - 2009   . Tendinitis    History in Rotator cuff  . TIA (transient ischemic attack) ?12/28/2013  . Type II diabetes mellitus ([place])    Past Surgical History:  Procedure Laterality Date  . CARDIOVERSION  X 1  . CYSTOSCOPY WITH LITHOLAPAXY N/A 12/05/2017   Procedure: CYSTOSCOPY WITH LITHOLAPAXY;  Surgeon: Kathie Rhodes, MD;  Location: [facility];  Service: Urology;  Laterality: N/A;  . CYSTOSCOPY WITH RETROGRADE PYELOGRAM, URETEROSCOPY AND STENT PLACEMENT Right 08/18/2015   Procedure: CYSTOSCOPY WITH RIGHT  RETROGRADE PYELOGRAM, RIGHT URETEROGRAM.;  Surgeon: Kathie Rhodes, MD;  Location: [facility];  Service: Urology;  Laterality: Right;  . TONSILLECTOMY      Current Outpatient Medications  Medication Sig Dispense Refill  . atorvastatin (LIPITOR) 80 MG tablet Take 80 mg by mouth every evening.     . chlorthalidone (HYGROTON) 25 MG tablet Take 12.5 mg by mouth daily.     . COMBIGAN 0.2-0.5 % ophthalmic solution Place 1 drop into both eyes 2 (two) times daily.    Marland Kitchen diltiazem (TIAZAC) 240 MG 24 hr capsule Take 1 capsule by mouth  daily.    Marland Kitchen ELIQUIS 5 MG TABS tablet TAKE 1 TABLET BY MOUTH TWICE A DAY 180 tablet 1  . ezetimibe (ZETIA) 10 MG tablet TAKE ONE TABLET BY MOUTH ONE TIME DAILY 90 tablet 3  . LUMIGAN 0.01 % SOLN     . metFORMIN (GLUCOPHAGE) 500 MG tablet Take 500 mg by mouth daily with breakfast.    . metoprolol tartrate (LOPRESSOR) 25 MG tablet Take 25 mg by mouth 2 (two) times daily.    . valsartan (DIOVAN) 320 MG tablet Take 320 mg by mouth daily.    Marland Kitchen zolpidem (AMBIEN CR) 12.5 MG CR tablet Take 12.5 mg by mouth at bedtime.     No current facility-administered medications for this encounter.     No Known Allergies  Social History   Socioeconomic History  . Marital status: Married    Spouse name: Not on file  . Number of children: 2  . Years of education: college  . Highest education level: Not on file  Occupational History  . Not on file  Tobacco Use  . Smoking status: Never Smoker  . Smokeless tobacco: Never Used  Vaping Use  . Vaping Use: Never used  Substance and Sexual Activity  . Alcohol use: Yes    Alcohol/week: 1.0 standard drink    Types: 1 Glasses of wine per week    Comment: occ  . Drug use: No  . Sexual activity: Not on file  Other Topics Concern  . Not on file  Social History Narrative   Patient is married with 2 children.   Patient is right handed.   Patient has college education.   Patient drinks very little caffeine.   Social Determinants of Health   Financial Resource Strain: Not on file  Food Insecurity: Not on file  Transportation Needs: Not on file  Physical Activity: Not on file  Stress: Not on file  Social Connections: Not on file  Intimate Partner Violence: Not on file     ROS- All systems are reviewed and negative except as per the HPI above.  Physical Exam: Vitals:   07/31/20 0844  BP: 128/88  Pulse: (!) 52  Weight: 100.8 kg  Height: 6' (1.829 m)    GEN- The patient is well appearing obese elderly male, alert and oriented x 3 today.   Head- normocephalic, atraumatic Eyes-  Sclera clear, conjunctiva pink Ears- hearing intact Oropharynx- clear Neck- supple  Lungs- Clear to ausculation bilaterally, normal work of breathing Heart- irregular rate and rhythm, no murmurs, rubs or gallops  GI- soft, NT, ND, + BS Extremities- no clubbing, cyanosis, or edema MS- no significant deformity or atrophy Skin- no rash or lesion Psych- euthymic mood, full affect Neuro- strength and sensation are intact  Wt Readings from Last 3 Encounters:  07/31/20 100.8 kg  06/29/20 102.2 kg  06/14/20 101.2 kg    EKG today demonstrates   Afib Vent. rate 52 BPM PR interval * ms QRS duration 96 ms QT/QTcB 428/398 ms  Echo 11/19/18 demonstrated  1. The left ventricle has normal systolic function with an ejection  fraction of 60-65%. The cavity size was normal. There is mildly increased  left ventricular wall thickness. Left ventricular diastolic function could  not be evaluated secondary to atrial  fibrillation.  2. Left atrial size was mildly dilated.  3. There is mild mitral annular calcification present.  4. The aortic valve is tricuspid. Mild sclerosis of the aortic valve.  Aortic valve regurgitation was not assessed by  color flow Doppler.  5. The aorta is normal unless otherwise noted.   Cardiac CT 07/17/20 The left atrial appendage is large chicken wing type with two lobes that is directed anteriorly. Thrombus: There is mixing artifact in the LAA on contrast imaging, but there is no thrombus on delayed imaging. Landing Zone measurement: 25.4 mm x 21.4 mm LAA Length (maximum): 21.1 mm Watchman FLX Device: A 31 mm device is recommended with 18% compression.  Epic records are reviewed at length today  HAS-BLED score 4 Hypertension Yes  Abnormal renal and liver function (Dialysis, transplant, Cr >2.26 mg/dL /Cirrhosis or Bilirubin >2x Normal or AST/ALT/AP >3x Normal) No  Stroke Yes  Bleeding Yes  Labile INR (Unstable/high INR) No  Elderly (>65) Yes  Drugs or alcohol (? 8 drinks/week, anti-plt or NSAID) No   CHA2DS2-VASc Score = 7  The patient's score is based upon: CHF History: No HTN History: Yes Diabetes History: Yes Stroke History: Yes Vascular Disease History: Yes Age Score: 2 Gender Score: 0      ASSESSMENT AND PLAN: 1. Permanent Atrial Fibrillation (ICD10:  I48.11) The patient's CHA2DS2-VASc score is 7, indicating a 11.2% annual risk of stroke.   Norman [name] is a 75 y.o. male in the office today who has been referred by for a Watchman left atrial appendage closure device.  He  is not felt to be a long term anticoagulation candidate secondary to hematuria.  The patients chart has been reviewed and they would be a candidate for short term oral anticoagulation.  Procedural risks for the Watchman implant have again been reviewed with the patient including a 0.5% risk of stroke, <1% risk of perforation, <1% risk of device embolization.     Check CXR Check bmet/cbc Continue Eliquis 5 mg BID, hold morning of procedure. Start ASA 81 mg daily day of procedure.  2. Secondary Hypercoagulable State (ICD10:  D68.69) The patient is at significant risk for stroke/thromboembolism based upon his CHA2DS2-VASc Score of 7.  Continue Apixaban (Eliquis) for now. See plans above.  3. Obesity Body mass index is 30.14 kg/m. Lifestyle modification was discussed at length including regular exercise and weight reduction.  4. HTN Stable, no changes today.  5. CAD Noted on CT No anginal symptoms.   Follow up for Watchman implant 08/03/20.    [facility] [address] [place], [place] [address] [phone] 07/31/2020 8:56 AM

## 2020-07-31 NOTE — Patient Instructions (Addendum)
Day of procedure you will start Aspirin 81mg  once a day   LEFT ATRIAL APPENDAGE CLOSURE INSTRUCTIONS:  You are scheduled for a Left Atrial Appendage Device Implantation on Thursday, May 12th  1. Please arrive at the [facility] (Main Entrance A) at [facility]: [address] [place], [place] [address] at 730AM   (This time is two hours before your procedure to ensure your preparation). Free valet parking service is available. You are allowed ONE visitor in the waiting room during your procedure. Both you and your guest must wear masks. Special note: Every effort is made to have your procedure done on time. Please understand that emergencies sometimes delay scheduled procedures.  2.   Do not eat or drink after midnight prior to your procedure.     3.   Medication Instructions: Take ONLY the listed medications morning of procedure with a sip of water: Chlorthalidone, Diltiazem, Metoprolol, Valsartan, Aspirin  4. Plan for one night stay--bring personal belongings. When you are discharged, you will need someone to drive you home and stay with you for 24 hours.  5. Bring a current list of your medications and current insurance cards.  6. Please wear clothes that are easy to get on and off and wear slip-on shoes.  7. Ambrose Pancoast, the [facility], will arrange follow-up for you during your hospital stay and you will be discharged with your appointment dates/times. His direct number is [phone] if you need assistance.  **If you have any questions, do not hesitate to call the office! You will also be contacted the week of your procedure to confirm instructions.**

## 2020-07-31 NOTE — H&P (View-Only) (Signed)
  Primary Care Physician: Swayne, David, MD Primary Cardiologist: Dr Turner Primary Electrophysiologist: Dr Lambert  Referring Physician: Dr Lambert   Norman [name] is a 75 y.o. male with a history of carotid artery stenosis, DM, prior CVA, CAD, HLD, HTN, atrial fibrillation who presents for follow up in the  [facility]. Patient is on Eliquis for a CHADS2VASC score of 7. Patient was seen by Dr Lambert for Watchman device consideration. He is not felt to be a candidate for long term anticoagulation due to hematuria. He is scheduled for Watchman implant 08/03/20. He is in rate controlled afib today and asymptomatic.   Today, he denies symptoms of palpitations, chest pain, shortness of breath, orthopnea, PND, lower extremity edema, dizziness, presyncope, syncope, snoring, daytime somnolence, bleeding, or neurologic sequela. The patient is tolerating medications without difficulties and is otherwise without complaint today.    Atrial Fibrillation Risk Factors:  he does not have symptoms or diagnosis of sleep apnea. he does not have a history of rheumatic fever.   he has a BMI of Body mass index is 30.14 kg/m.. Filed Weights   07/31/20 0844  Weight: 100.8 kg    Family History  Problem Relation Age of Onset  . Kidney failure Mother   . Heart attack Father        stenosis  . Diabetes Maternal Aunt      Atrial Fibrillation Management history:  Previous antiarrhythmic drugs: none Previous cardioversions: remotely  Previous ablations: none CHADS2VASC score: 7 Anticoagulation history: Eliquis   Past Medical History:  Diagnosis Date  . Anxiety   . Blood in urine 2 1/2 WEEKS AGO  . BPH (benign prostatic hypertrophy)    Mild  . Coronary artery calcification seen on CAT scan 2011   no ischemia on nuclear stress test 2011  . Dilated aortic root ([facility])    37mm by echo 2017  . Dysrhythmia    afib  . Epididymal cyst 2005   Bilateral and small  bilateral hydroceles with most recent ultra sound  . Glaucoma   . History of kidney stones   . Hypercholesteremia   . Hypertension   . Kidney stones    "I've passed them all"  . Migraine    "got off caffeine; they went away"  . Permanent atrial fibrillation ([facility])   . Stroke ([facility])    TIA ? - 2009   . Tendinitis    History in Rotator cuff  . TIA (transient ischemic attack) ?12/28/2013  . Type II diabetes mellitus ([facility])    Past Surgical History:  Procedure Laterality Date  . CARDIOVERSION  X 1  . CYSTOSCOPY WITH LITHOLAPAXY N/A 12/05/2017   Procedure: CYSTOSCOPY WITH LITHOLAPAXY;  Surgeon: Ottelin, Mark, MD;  Location: [facility];  Service: Urology;  Laterality: N/A;  . CYSTOSCOPY WITH RETROGRADE PYELOGRAM, URETEROSCOPY AND STENT PLACEMENT Right 08/18/2015   Procedure: CYSTOSCOPY WITH RIGHT  RETROGRADE PYELOGRAM, RIGHT URETEROGRAM.;  Surgeon: Mark Ottelin, MD;  Location: [facility];  Service: Urology;  Laterality: Right;  . TONSILLECTOMY      Current Outpatient Medications  Medication Sig Dispense Refill  . atorvastatin (LIPITOR) 80 MG tablet Take 80 mg by mouth every evening.     . chlorthalidone (HYGROTON) 25 MG tablet Take 12.5 mg by mouth daily.     . COMBIGAN 0.2-0.5 % ophthalmic solution Place 1 drop into both eyes 2 (two) times daily.    . diltiazem (TIAZAC) 240 MG 24 hr capsule Take 1 capsule by mouth   daily.    Marland Kitchen ELIQUIS 5 MG TABS tablet TAKE 1 TABLET BY MOUTH TWICE A DAY 180 tablet 1  . ezetimibe (ZETIA) 10 MG tablet TAKE ONE TABLET BY MOUTH ONE TIME DAILY 90 tablet 3  . LUMIGAN 0.01 % SOLN     . metFORMIN (GLUCOPHAGE) 500 MG tablet Take 500 mg by mouth daily with breakfast.    . metoprolol tartrate (LOPRESSOR) 25 MG tablet Take 25 mg by mouth 2 (two) times daily.    . valsartan (DIOVAN) 320 MG tablet Take 320 mg by mouth daily.    Marland Kitchen zolpidem (AMBIEN CR) 12.5 MG CR tablet Take 12.5 mg by mouth at bedtime.     No current facility-administered medications for this encounter.     No Known Allergies  Social History   Socioeconomic History  . Marital status: Married    Spouse name: Not on file  . Number of children: 2  . Years of education: college  . Highest education level: Not on file  Occupational History  . Not on file  Tobacco Use  . Smoking status: Never Smoker  . Smokeless tobacco: Never Used  Vaping Use  . Vaping Use: Never used  Substance and Sexual Activity  . Alcohol use: Yes    Alcohol/week: 1.0 standard drink    Types: 1 Glasses of wine per week    Comment: occ  . Drug use: No  . Sexual activity: Not on file  Other Topics Concern  . Not on file  Social History Narrative   Patient is married with 2 children.   Patient is right handed.   Patient has college education.   Patient drinks very little caffeine.   Social Determinants of Health   Financial Resource Strain: Not on file  Food Insecurity: Not on file  Transportation Needs: Not on file  Physical Activity: Not on file  Stress: Not on file  Social Connections: Not on file  Intimate Partner Violence: Not on file     ROS- All systems are reviewed and negative except as per the HPI above.  Physical Exam: Vitals:   07/31/20 0844  BP: 128/88  Pulse: (!) 52  Weight: 100.8 kg  Height: 6' (1.829 m)    GEN- The patient is well appearing obese elderly male, alert and oriented x 3 today.   Head- normocephalic, atraumatic Eyes-  Sclera clear, conjunctiva pink Ears- hearing intact Oropharynx- clear Neck- supple  Lungs- Clear to ausculation bilaterally, normal work of breathing Heart- irregular rate and rhythm, no murmurs, rubs or gallops  GI- soft, NT, ND, + BS Extremities- no clubbing, cyanosis, or edema MS- no significant deformity or atrophy Skin- no rash or lesion Psych- euthymic mood, full affect Neuro- strength and sensation are intact  Wt Readings from Last 3 Encounters:  07/31/20 100.8 kg  06/29/20 102.2 kg  06/14/20 101.2 kg    EKG today demonstrates   Afib Vent. rate 52 BPM PR interval * ms QRS duration 96 ms QT/QTcB 428/398 ms  Echo 11/19/18 demonstrated  1. The left ventricle has normal systolic function with an ejection  fraction of 60-65%. The cavity size was normal. There is mildly increased  left ventricular wall thickness. Left ventricular diastolic function could  not be evaluated secondary to atrial  fibrillation.  2. Left atrial size was mildly dilated.  3. There is mild mitral annular calcification present.  4. The aortic valve is tricuspid. Mild sclerosis of the aortic valve.  Aortic valve regurgitation was not assessed by  color flow Doppler.  5. The aorta is normal unless otherwise noted.   Cardiac CT 07/17/20 The left atrial appendage is large chicken wing type with two lobes that is directed anteriorly. Thrombus: There is mixing artifact in the LAA on contrast imaging, but there is no thrombus on delayed imaging. Landing Zone measurement: 25.4 mm x 21.4 mm LAA Length (maximum): 21.1 mm Watchman FLX Device: A 31 mm device is recommended with 18% compression.  Epic records are reviewed at length today  HAS-BLED score 4 Hypertension Yes  Abnormal renal and liver function (Dialysis, transplant, Cr >2.26 mg/dL /Cirrhosis or Bilirubin >2x Normal or AST/ALT/AP >3x Normal) No  Stroke Yes  Bleeding Yes  Labile INR (Unstable/high INR) No  Elderly (>65) Yes  Drugs or alcohol (? 8 drinks/week, anti-plt or NSAID) No   CHA2DS2-VASc Score = 7  The patient's score is based upon: CHF History: No HTN History: Yes Diabetes History: Yes Stroke History: Yes Vascular Disease History: Yes Age Score: 2 Gender Score: 0      ASSESSMENT AND PLAN: 1. Permanent Atrial Fibrillation (ICD10:  I48.11) The patient's CHA2DS2-VASc score is 7, indicating a 11.2% annual risk of stroke.   Norman [name] is a 75 y.o. male in the office today who has been referred by for a Watchman left atrial appendage closure device.  He  is not felt to be a long term anticoagulation candidate secondary to hematuria.  The patients chart has been reviewed and they would be a candidate for short term oral anticoagulation.  Procedural risks for the Watchman implant have again been reviewed with the patient including a 0.5% risk of stroke, <1% risk of perforation, <1% risk of device embolization.     Check CXR Check bmet/cbc Continue Eliquis 5 mg BID, hold morning of procedure. Start ASA 81 mg daily day of procedure.  2. Secondary Hypercoagulable State (ICD10:  D68.69) The patient is at significant risk for stroke/thromboembolism based upon his CHA2DS2-VASc Score of 7.  Continue Apixaban (Eliquis) for now. See plans above.  3. Obesity Body mass index is 30.14 kg/m. Lifestyle modification was discussed at length including regular exercise and weight reduction.  4. HTN Stable, no changes today.  5. CAD Noted on CT No anginal symptoms.   Follow up for Watchman implant 08/03/20.    Norman Jeryn Bertoni PA-C [facility] [address] [place], [place] [address] [phone] 07/31/2020 8:56 AM 

## 2020-08-01 ENCOUNTER — Other Ambulatory Visit (HOSPITAL_COMMUNITY)
Admission: RE | Admit: 2020-08-01 | Discharge: 2020-08-01 | Disposition: A | Discharge status: Home / Self Care | Payer: Medicare PPO | Source: Ambulatory Visit | Attending: Cardiology | Admitting: Cardiology

## 2020-08-01 DIAGNOSIS — Z01812 Encounter for preprocedural laboratory examination: Secondary | ICD-10-CM | POA: Insufficient documentation

## 2020-08-01 DIAGNOSIS — Z20822 Contact with and (suspected) exposure to covid-19: Secondary | ICD-10-CM | POA: Insufficient documentation

## 2020-08-01 LAB — SARS CORONAVIRUS 2 (TAT 6-24 HRS): SARS Coronavirus 2: NEGATIVE

## 2020-08-02 NOTE — Progress Notes (Signed)
Anesthesia Chart Review:  Case: [id number] Date/Time: 08/03/20 0930   Procedures:      LEFT ATRIAL APPENDAGE OCCLUSION (N/A )     TRANSESOPHAGEAL ECHOCARDIOGRAM (TEE) (N/A )   Anesthesia type: General   Diagnosis: Atrial fibrillation, unspecified type ([place]) [I48.91]   Pre-op diagnosis: afib   Location: [place] CATH LAB 6 / [place] INVASIVE CV LAB   Providers: Vickie Epley, MD      DISCUSSION: Patient is a 75 year old male scheduled for the above procedure.  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  At 06/14/20 visit with Dr. Radford Pax, she notes that patient is having hematuria and needs a cystoscopy with TURP in the future. He would need to come off anticoagulation for 5-7 days, but given prior CVA and chronic afib would be at high risk for cardioembolic events off anticoagulation. He was interested in being considered for Watchman device, so was referred to Dr. Quentin Ore. Seen at [facility] by Malka So, [place] on 07/31/20 with instructions provided.   08/01/2020 preprocedure COVID-19 test negative.  Anesthesia team to evaluate on the day of procedure.   PROVIDERS: Antony Contras, MD is PCP Lars Mage, MD is EP Fransico Him, MD is cardiologist   LABS:  Lab Results  Component Value Date   WBC 7.5 07/31/2020   HGB 16.6 07/31/2020   HCT 50.3 07/31/2020   PLT 213 07/31/2020   GLUCOSE 140 (H) 07/31/2020   NA 134 (L) 07/31/2020   K 3.7 07/31/2020   CL 101 07/31/2020   CREATININE 1.14 07/31/2020   BUN 16 07/31/2020   CO2 26 07/31/2020      IMAGES: CXR 07/31/20: FINDINGS: Lungs are adequately inflated without focal airspace consolidation or effusion. Mild stable cardiomegaly. Remainder of the exam is unchanged. IMPRESSION: No active  cardiopulmonary disease.   EKG: 07/31/20: Atrial fibrillation with slow ventricular response with a competing junctional pacemaker Abnormal ECG No significant change since last tracing Confirmed by Lauree Chandler [phone]) on 07/31/2020 4:06:43 PM   CV: Echo 07/31/20: IMPRESSIONS  1. Left ventricular ejection fraction, by estimation, is 70 to 75%. The  left ventricle has hyperdynamic function. The left ventricle has no  regional wall motion abnormalities. Left ventricular diastolic parameters  are consistent with Grade I diastolic  dysfunction (impaired relaxation).  2. Right ventricular systolic function is normal. The right ventricular  size is normal.  3. The mitral valve is normal in structure. Moderate mitral valve  regurgitation. No evidence of mitral stenosis.  4. The aortic valve is normal in structure. Aortic valve regurgitation is  not visualized. No aortic stenosis is present.  5. The inferior vena cava is normal in size with greater than 50%  respiratory variability, suggesting right atrial pressure of 3 mmHg.    CT Coronary 07/17/20: IMPRESSION: 1. The left atrial appendage is large chicken wing type with two lobes that is directed anteriorly. 2. A 31 mm watchman FLX device is recommended based on the above landing zone measurements (25.4 mm maximum diameter; 18% compression). 3. There is no thrombus in the left atrial appendage on delayed imaging. 4. A mid and mid IAS puncture site is recommended. An anterior curve catheter is recommended. 5. Optimal deployment angle: RAO 1 CRA 1 6. Normal coronary origin. Right dominance. CAC score of 976, which is 75th percentile  for age- and sex-matched controls.   Nuclear stress test 11/19/18:  There was no ST segment deviation noted during stress.  Nuclear stress EF: 62%.  The left ventricular ejection fraction is normal (55-65%).  The study is normal.  This is a low risk study.    Carotid US  12/29/13: Summary:  Findings suggest 1-39% internal carotid artery stenosis  bilaterally. Vertebral arteries are patent with antegrade flow.     Past Medical History:  Diagnosis Date  . Anxiety   . Blood in urine 2 1/2 WEEKS AGO  . BPH (benign prostatic hypertrophy)    Mild  . Coronary artery calcification seen on CAT scan 2011   no ischemia on nuclear stress test 2011  . Dilated aortic root ([place])    48mm by echo 2017  . Dysrhythmia    afib  . Epididymal cyst 2005   Bilateral and small bilateral hydroceles with most recent ultra sound  . Glaucoma   . History of kidney stones   . Hypercholesteremia   . Hypertension   . Kidney stones    "I've passed them all"  . Migraine    "got off caffeine; they went away"  . Permanent atrial fibrillation ([place])   . Stroke [facility])    TIA ? - 2009   . Tendinitis    History in Rotator cuff  . TIA (transient ischemic attack) ?12/28/2013  . Type II diabetes mellitus ([place])     Past Surgical History:  Procedure Laterality Date  . CARDIOVERSION  X 1  . CYSTOSCOPY WITH LITHOLAPAXY N/A 12/05/2017   Procedure: CYSTOSCOPY WITH LITHOLAPAXY;  Surgeon: Kathie Rhodes, MD;  Location: [facility]S;  Service: Urology;  Laterality: N/A;  . CYSTOSCOPY WITH RETROGRADE PYELOGRAM, URETEROSCOPY AND STENT PLACEMENT Right 08/18/2015   Procedure: CYSTOSCOPY WITH RIGHT  RETROGRADE PYELOGRAM, RIGHT URETEROGRAM.;  Surgeon: Kathie Rhodes, MD;  Location: [facility];  Service: Urology;  Laterality: Right;  . TONSILLECTOMY      MEDICATIONS: No current facility-administered medications for this encounter.   Marland Kitchen atorvastatin (LIPITOR) 80 MG tablet  . chlorthalidone (HYGROTON) 25 MG tablet  . COMBIGAN 0.2-0.5 % ophthalmic solution  . diltiazem (TIAZAC) 240 MG 24 hr capsule  . ELIQUIS 5 MG TABS tablet  . ezetimibe (ZETIA) 10 MG tablet  . LUMIGAN 0.01 % SOLN  . metFORMIN (GLUCOPHAGE) 500 MG tablet  . metoprolol tartrate (LOPRESSOR) 25 MG tablet  . valsartan (DIOVAN) 320 MG tablet   . zolpidem (AMBIEN CR) 12.5 MG CR tablet  . [START ON 08/03/2020] aspirin EC 81 MG tablet    Myra Gianotti, PA-C Surgical Short Stay/Anesthesiology [facility] Phone [phone] [facility] Phone [phone] 08/02/2020 4:31 PM

## 2020-08-02 NOTE — Progress Notes (Signed)
preop call not needed for this procedure  EKG:07/31/20 CXR: 07/31/20 ECHO: 07/31/20 Stress Test: 11/19/18 Cardiac Cath:

## 2020-08-02 NOTE — Anesthesia Preprocedure Evaluation (Addendum)
Anesthesia Evaluation  Patient identified by MRN, date of birth, ID band Patient awake    Reviewed: Allergy & Precautions, H&P , NPO status , Patient's Chart, lab work & pertinent test results  Airway Mallampati: II   Neck ROM: full    Dental   Pulmonary    breath sounds clear to auscultation       Cardiovascular hypertension, + CAD  + dysrhythmias Atrial Fibrillation  Rhythm:irregular Rate:Normal     Neuro/Psych  Headaches, PSYCHIATRIC DISORDERS Anxiety CVA    GI/Hepatic   Endo/Other  diabetes, Type 2  Renal/GU Renal diseasestones     Musculoskeletal   Abdominal   Peds  Hematology   Anesthesia Other Findings   Reproductive/Obstetrics                            Anesthesia Physical Anesthesia Plan  ASA: III  Anesthesia Plan: General   Post-op Pain Management:    Induction: Intravenous  PONV Risk Score and Plan: 2 and Ondansetron, Dexamethasone and Treatment may vary due to age or medical condition  Airway Management Planned: Oral ETT  Additional Equipment: Arterial line and TEE  Intra-op Plan:   Post-operative Plan: Extubation in OR  Informed Consent: I have reviewed the patients History and Physical, chart, labs and discussed the procedure including the risks, benefits and alternatives for the proposed anesthesia with the patient or authorized representative who has indicated his/her understanding and acceptance.     Dental advisory given  Plan Discussed with: CRNA, Anesthesiologist and Surgeon  Anesthesia Plan Comments: (PAT note written 08/02/2020 by Myra Gianotti, PA-C. )       Anesthesia Quick Evaluation

## 2020-08-03 ENCOUNTER — Other Ambulatory Visit: Payer: Self-pay

## 2020-08-03 ENCOUNTER — Inpatient Hospital Stay (HOSPITAL_COMMUNITY)
Admission: RE | Admit: 2020-08-03 | Discharge: 2020-08-04 | DRG: 274 | Disposition: A | Discharge status: Home / Self Care | Payer: Medicare PPO | Attending: Cardiology | Admitting: Cardiology

## 2020-08-03 ENCOUNTER — Encounter (HOSPITAL_COMMUNITY): Payer: Self-pay | Admitting: Cardiology

## 2020-08-03 ENCOUNTER — Ambulatory Visit (HOSPITAL_COMMUNITY): Payer: Medicare PPO | Attending: Cardiology

## 2020-08-03 ENCOUNTER — Inpatient Hospital Stay (HOSPITAL_COMMUNITY): Payer: Medicare PPO | Admitting: Vascular Surgery

## 2020-08-03 ENCOUNTER — Encounter (HOSPITAL_COMMUNITY)
Admission: RE | Disposition: A | Discharge status: Home / Self Care | Payer: Self-pay | Source: Home / Self Care | Attending: Cardiology

## 2020-08-03 DIAGNOSIS — Z7984 Long term (current) use of oral hypoglycemic drugs: Secondary | ICD-10-CM | POA: Diagnosis not present

## 2020-08-03 DIAGNOSIS — E785 Hyperlipidemia, unspecified: Secondary | ICD-10-CM | POA: Diagnosis present

## 2020-08-03 DIAGNOSIS — H409 Unspecified glaucoma: Secondary | ICD-10-CM | POA: Diagnosis present

## 2020-08-03 DIAGNOSIS — I4821 Permanent atrial fibrillation: Principal | ICD-10-CM | POA: Diagnosis present

## 2020-08-03 DIAGNOSIS — E669 Obesity, unspecified: Secondary | ICD-10-CM | POA: Diagnosis present

## 2020-08-03 DIAGNOSIS — I10 Essential (primary) hypertension: Secondary | ICD-10-CM | POA: Diagnosis present

## 2020-08-03 DIAGNOSIS — Z7901 Long term (current) use of anticoagulants: Secondary | ICD-10-CM | POA: Diagnosis not present

## 2020-08-03 DIAGNOSIS — N400 Enlarged prostate without lower urinary tract symptoms: Secondary | ICD-10-CM | POA: Diagnosis present

## 2020-08-03 DIAGNOSIS — Z6830 Body mass index (BMI) 30.0-30.9, adult: Secondary | ICD-10-CM | POA: Diagnosis not present

## 2020-08-03 DIAGNOSIS — Z20822 Contact with and (suspected) exposure to covid-19: Secondary | ICD-10-CM | POA: Diagnosis present

## 2020-08-03 DIAGNOSIS — Z006 Encounter for examination for normal comparison and control in clinical research program: Secondary | ICD-10-CM | POA: Diagnosis not present

## 2020-08-03 DIAGNOSIS — Z8673 Personal history of transient ischemic attack (TIA), and cerebral infarction without residual deficits: Secondary | ICD-10-CM | POA: Diagnosis not present

## 2020-08-03 DIAGNOSIS — Z79899 Other long term (current) drug therapy: Secondary | ICD-10-CM

## 2020-08-03 DIAGNOSIS — Z833 Family history of diabetes mellitus: Secondary | ICD-10-CM

## 2020-08-03 DIAGNOSIS — E119 Type 2 diabetes mellitus without complications: Secondary | ICD-10-CM | POA: Diagnosis present

## 2020-08-03 DIAGNOSIS — D6869 Other thrombophilia: Secondary | ICD-10-CM | POA: Diagnosis present

## 2020-08-03 DIAGNOSIS — F419 Anxiety disorder, unspecified: Secondary | ICD-10-CM | POA: Diagnosis present

## 2020-08-03 DIAGNOSIS — I2510 Atherosclerotic heart disease of native coronary artery without angina pectoris: Secondary | ICD-10-CM | POA: Diagnosis present

## 2020-08-03 DIAGNOSIS — I4891 Unspecified atrial fibrillation: Secondary | ICD-10-CM | POA: Diagnosis present

## 2020-08-03 HISTORY — PX: LEFT ATRIAL APPENDAGE OCCLUSION: EP1229

## 2020-08-03 HISTORY — PX: TEE WITHOUT CARDIOVERSION: SHX5443

## 2020-08-03 LAB — GLUCOSE, CAPILLARY
Glucose-Capillary: 141 mg/dL — ABNORMAL HIGH (ref 70–99)
Glucose-Capillary: 122 mg/dL — ABNORMAL HIGH (ref 70–99)

## 2020-08-03 LAB — SURGICAL PCR SCREEN
MRSA, PCR: NEGATIVE
Staphylococcus aureus: NEGATIVE

## 2020-08-03 LAB — ABO/RH: ABO/RH(D): O POS

## 2020-08-03 LAB — TYPE AND SCREEN
ABO/RH(D): O POS
Antibody Screen: NEGATIVE

## 2020-08-03 LAB — HEMOGLOBIN A1C
Hgb A1c MFr Bld: 6.3 % — ABNORMAL HIGH (ref 4.8–5.6)
Mean Plasma Glucose: 134.11 mg/dL

## 2020-08-03 LAB — POCT ACTIVATED CLOTTING TIME: Activated Clotting Time: 428 seconds

## 2020-08-03 SURGERY — LEFT ATRIAL APPENDAGE OCCLUSION
Anesthesia: General

## 2020-08-03 MED ORDER — HEPARIN (PORCINE) IN NACL 1000-0.9 UT/500ML-% IV SOLN
INTRAVENOUS | Status: DC | PRN
  Administered 2020-08-03: 500 mL

## 2020-08-03 MED ORDER — HEPARIN (PORCINE) IN NACL 2000-0.9 UNIT/L-% IV SOLN
INTRAVENOUS | Status: AC
  Filled 2020-08-03: qty 1000

## 2020-08-03 MED ORDER — SODIUM CHLORIDE 0.9 % IV SOLN: INTRAVENOUS | Status: DC

## 2020-08-03 MED ORDER — CHLORTHALIDONE 25 MG PO TABS
12.5000 mg | ORAL_TABLET | Freq: Every day | ORAL | Status: DC
  Administered 2020-08-04: 12.5 mg via ORAL
  Filled 2020-08-03: qty 0.5

## 2020-08-03 MED ORDER — SUGAMMADEX SODIUM 200 MG/2ML IV SOLN
INTRAVENOUS | Status: DC | PRN
  Administered 2020-08-03: 200 mg via INTRAVENOUS

## 2020-08-03 MED ORDER — ONDANSETRON HCL 4 MG/2ML IJ SOLN: 4.0000 mg | Freq: Four times a day (QID) | INTRAMUSCULAR | Status: DC | PRN

## 2020-08-03 MED ORDER — ATORVASTATIN CALCIUM 80 MG PO TABS
80.0000 mg | ORAL_TABLET | Freq: Every evening | ORAL | Status: DC
  Administered 2020-08-03: 80 mg via ORAL
  Filled 2020-08-03: qty 1

## 2020-08-03 MED ORDER — LACTATED RINGERS IV SOLN: INTRAVENOUS | Status: DC

## 2020-08-03 MED ORDER — PHENYLEPHRINE 40 MCG/ML (10ML) SYRINGE FOR IV PUSH (FOR BLOOD PRESSURE SUPPORT)
PREFILLED_SYRINGE | INTRAVENOUS | Status: DC | PRN
  Administered 2020-08-03 (×2): 80 ug via INTRAVENOUS

## 2020-08-03 MED ORDER — SODIUM CHLORIDE 0.9 % IV SOLN: 250.0000 mL | INTRAVENOUS | Status: DC | PRN

## 2020-08-03 MED ORDER — IRBESARTAN 150 MG PO TABS
300.0000 mg | ORAL_TABLET | Freq: Every day | ORAL | Status: DC
  Administered 2020-08-04: 300 mg via ORAL
  Filled 2020-08-03: qty 2

## 2020-08-03 MED ORDER — CEFAZOLIN SODIUM-DEXTROSE 2-4 GM/100ML-% IV SOLN
2.0000 g | INTRAVENOUS | Status: AC
Start: 2020-08-03 — End: 2020-08-03
  Administered 2020-08-03: 2 g via INTRAVENOUS
  Filled 2020-08-03: qty 100

## 2020-08-03 MED ORDER — LIDOCAINE 2% (20 MG/ML) 5 ML SYRINGE
INTRAMUSCULAR | Status: DC | PRN
  Administered 2020-08-03: 50 mg via INTRAVENOUS

## 2020-08-03 MED ORDER — MENTHOL 3 MG MT LOZG
1.0000 | LOZENGE | OROMUCOSAL | Status: DC | PRN
  Filled 2020-08-03: qty 9

## 2020-08-03 MED ORDER — ONDANSETRON HCL 4 MG/2ML IJ SOLN
INTRAMUSCULAR | Status: DC | PRN
  Administered 2020-08-03: 4 mg via INTRAVENOUS

## 2020-08-03 MED ORDER — SODIUM CHLORIDE 0.9% FLUSH
3.0000 mL | Freq: Two times a day (BID) | INTRAVENOUS | Status: DC
  Administered 2020-08-03 – 2020-08-04 (×2): 3 mL via INTRAVENOUS

## 2020-08-03 MED ORDER — IOHEXOL 350 MG/ML SOLN
INTRAVENOUS | Status: DC | PRN
  Administered 2020-08-03: 10 mL
  Administered 2020-08-03: 8 mL

## 2020-08-03 MED ORDER — MIDAZOLAM HCL 5 MG/5ML IJ SOLN
INTRAMUSCULAR | Status: DC | PRN
  Administered 2020-08-03: 2 mg via INTRAVENOUS

## 2020-08-03 MED ORDER — PHENYLEPHRINE HCL-NACL 10-0.9 MG/250ML-% IV SOLN
INTRAVENOUS | Status: DC | PRN
  Administered 2020-08-03: 50 ug/min via INTRAVENOUS

## 2020-08-03 MED ORDER — ROCURONIUM BROMIDE 10 MG/ML (PF) SYRINGE
PREFILLED_SYRINGE | INTRAVENOUS | Status: DC | PRN
  Administered 2020-08-03: 60 mg via INTRAVENOUS

## 2020-08-03 MED ORDER — APIXABAN 5 MG PO TABS
5.0000 mg | ORAL_TABLET | Freq: Two times a day (BID) | ORAL | Status: DC
  Administered 2020-08-03 – 2020-08-04 (×2): 5 mg via ORAL
  Filled 2020-08-03 (×2): qty 1

## 2020-08-03 MED ORDER — EZETIMIBE 10 MG PO TABS
10.0000 mg | ORAL_TABLET | Freq: Every day | ORAL | Status: DC
  Administered 2020-08-04: 10 mg via ORAL
  Filled 2020-08-03: qty 1

## 2020-08-03 MED ORDER — METOPROLOL TARTRATE 25 MG PO TABS
25.0000 mg | ORAL_TABLET | Freq: Two times a day (BID) | ORAL | Status: DC
  Administered 2020-08-03 – 2020-08-04 (×2): 25 mg via ORAL
  Filled 2020-08-03 (×2): qty 1

## 2020-08-03 MED ORDER — HEPARIN (PORCINE) IN NACL 2000-0.9 UNIT/L-% IV SOLN
INTRAVENOUS | Status: DC | PRN
  Administered 2020-08-03: 1000 mL

## 2020-08-03 MED ORDER — HEPARIN (PORCINE) IN NACL 1000-0.9 UT/500ML-% IV SOLN
INTRAVENOUS | Status: AC
  Filled 2020-08-03: qty 500

## 2020-08-03 MED ORDER — GLYCOPYRROLATE PF 0.2 MG/ML IJ SOSY
PREFILLED_SYRINGE | INTRAMUSCULAR | Status: DC | PRN
  Administered 2020-08-03: .2 mg via INTRAVENOUS

## 2020-08-03 MED ORDER — ASPIRIN EC 81 MG PO TBEC
81.0000 mg | DELAYED_RELEASE_TABLET | Freq: Every day | ORAL | Status: DC
  Administered 2020-08-04: 81 mg via ORAL
  Filled 2020-08-03: qty 1

## 2020-08-03 MED ORDER — PROPOFOL 10 MG/ML IV BOLUS
INTRAVENOUS | Status: DC | PRN
  Administered 2020-08-03: 130 mg via INTRAVENOUS

## 2020-08-03 MED ORDER — PROTAMINE SULFATE 10 MG/ML IV SOLN
INTRAVENOUS | Status: DC | PRN
  Administered 2020-08-03: 30 mg via INTRAVENOUS

## 2020-08-03 MED ORDER — BIMATOPROST 0.01 % OP SOLN: 1.0000 [drp] | Freq: Every day | OPHTHALMIC | Status: DC

## 2020-08-03 MED ORDER — DILTIAZEM HCL ER COATED BEADS 240 MG PO CP24
240.0000 mg | ORAL_CAPSULE | Freq: Every day | ORAL | Status: DC
  Administered 2020-08-04: 240 mg via ORAL
  Filled 2020-08-03: qty 1

## 2020-08-03 MED ORDER — HEPARIN SODIUM (PORCINE) 1000 UNIT/ML IJ SOLN
INTRAMUSCULAR | Status: DC | PRN
  Administered 2020-08-03: 15000 [IU] via INTRAVENOUS

## 2020-08-03 MED ORDER — BRIMONIDINE TARTRATE-TIMOLOL 0.2-0.5 % OP SOLN: 1.0000 [drp] | Freq: Two times a day (BID) | OPHTHALMIC | Status: DC

## 2020-08-03 MED ORDER — CHLORHEXIDINE GLUCONATE 0.12 % MT SOLN
15.0000 mL | Freq: Once | OROMUCOSAL | Status: AC
  Administered 2020-08-03: 15 mL via OROMUCOSAL
  Filled 2020-08-03 (×2): qty 15

## 2020-08-03 MED ORDER — SODIUM CHLORIDE 0.9% FLUSH: 3.0000 mL | INTRAVENOUS | Status: DC | PRN

## 2020-08-03 MED ORDER — FENTANYL CITRATE (PF) 100 MCG/2ML IJ SOLN
INTRAMUSCULAR | Status: DC | PRN
  Administered 2020-08-03: 100 ug via INTRAVENOUS

## 2020-08-03 MED ORDER — ACETAMINOPHEN 325 MG PO TABS: 650.0000 mg | ORAL_TABLET | ORAL | Status: DC | PRN

## 2020-08-03 MED ORDER — DEXAMETHASONE SODIUM PHOSPHATE 10 MG/ML IJ SOLN
INTRAMUSCULAR | Status: DC | PRN
  Administered 2020-08-03: 5 mg via INTRAVENOUS

## 2020-08-03 SURGICAL SUPPLY — 17 items
CATH DIAG 6FR PIGTAIL ANGLED (CATHETERS) ×2
DILATOR VESSEL 38 20CM 12FR (INTRODUCER) ×2
DILATOR VESSEL 38 20CM 16FR (INTRODUCER) ×2
KIT HEART LEFT (KITS) ×2
KIT VERSACROSS LRG ACCESS (CATHETERS) ×2
PACK CARDIAC CATHETERIZATION (CUSTOM PROCEDURE TRAY) ×2
PAD DEFIB LIFELINK (PAD) ×2
PERCLOSE PROGLIDE 6F (VASCULAR PRODUCTS) ×4
SHEATH PERFORMER 16FR 30 (SHEATH) ×2
SHEATH PINNACLE 8F 10CM (SHEATH) ×2
SHEATH PROBE COVER 6X72 (BAG) ×2
TRANSDUCER W/STOPCOCK (MISCELLANEOUS) ×2
TUBING CIL FLEX 10 FLL-RA (TUBING) ×4
WATCHMAN FLX 31 (Prosthesis & Implant Heart) ×2 IMPLANT
WATCHMAN FLX PROCEDURE DEVICE (KITS) ×2 IMPLANT
WATCHMAN PROCED TRUSEAL ACCESS (SHEATH) ×2
WATCHMAN TRUSEAL ANTER CURVE (SHEATH) ×2

## 2020-08-03 NOTE — Transfer of Care (Signed)
Immediate Anesthesia Transfer of Care Note  Patient: Norman [name]  Procedure(s) Performed: LEFT ATRIAL APPENDAGE OCCLUSION (N/A ) TRANSESOPHAGEAL ECHOCARDIOGRAM (TEE) (N/A )  Patient Location: Cath Lab  Anesthesia Type:General  Level of Consciousness: drowsy and patient cooperative  Airway & Oxygen Therapy: Patient Spontanous Breathing and Patient connected to nasal cannula oxygen  Post-op Assessment: Report given to RN, Post -op Vital signs reviewed and stable and Patient moving all extremities  Post vital signs: Reviewed and stable  Last Vitals:  Vitals Value Taken Time  BP 111/64 08/03/20 1056  Temp    Pulse 51 08/03/20 1056  Resp 24 08/03/20 1056  SpO2 95 % 08/03/20 1056  Vitals shown include unvalidated device data.  Last Pain:  Vitals:   08/03/20 0800  TempSrc:   PainSc: 0-No pain      Patients Stated Pain Goal: 3 (63/84/53 6468)  Complications: No complications documented.

## 2020-08-03 NOTE — Discharge Summary (Signed)
ELECTROPHYSIOLOGY PROCEDURE DISCHARGE SUMMARY    Patient ID: Norman [name],  MRN: [id number], DOB/AGE: 1945/04/06 75 y.o.  Admit date: 08/03/2020 Discharge date: 08/04/20  Primary Care Physician: Antony Contras, MD  Primary Cardiologist: Fransico Him, MD  Electrophysiologist: Vickie Epley, MD  Primary Discharge Diagnosis:  Permanent Atrial Fibrillation Poor candidacy for long term anticoagulation due to GU bleeding/bleeding prostate CHA2DS2Vasc is 6 on Eliquis  Secondary Discharge Diagnosis:  1. BPH with hx of prostate bleeding 2. HTN 3. CVA/TIA 4. DM  Procedures This Admission:  1. Left atrial appendage occlusive device placement on 08/03/20 by Dr. Quentin Ore.  CONCLUSIONS:  1.Successful implantation of a WATCHMAN left atrial appendage occlusive device   2. TEE demonstrating no LAA thrombus 3. No early apparent complications.  Brief HPI: Norman [name] is a 75 y.o. male with a history of Permanent Atrial Fibrillation. The patient was seen as an outpatient and thought to be a poor candidate for continued anticoagulation due to GU bleeding. Risks, benefits, and alternatives to left atrial appendage occlusive device placement device were reviewed with the patient who wished to proceed.  The patient underwent intra-procedural TEE.  Hospital Course:  The patient was admitted and underwent Left Atrial appendage occlusive device placement with details as outlined above.  He was monitored on telemetry overnight which demonstrated AFib 70's.  R groin was without complication on the day of discharge.   The patient feels well, no CP, SOB, site discomfort, he was examined by Dr. Quentin Ore and considered to be stable for discharge.  Wound care and restrictions were reviewed with the patient.  Follow up with Adline Peals, PA in 1 month and repeat TEE in approximately 45 days (both scheduled) post procedure to ensure proper seal of the device. They will follow up with Dr. Quentin Ore  in 12 weeks for post ablation follow up.    Discharge anticoagulation will be ASA 81mg  daily and Eliquis 5mg  BID  Physical Exam: Vitals:   08/03/20 2106 08/03/20 2300 08/04/20 0300 08/04/20 0734  BP: 113/71 117/68 134/82 118/82  Pulse: 75 77 77 75  Resp:  18 16   Temp:  97.8 F (36.6 C) 97.8 F (36.6 C) 98.1 F (36.7 C)  TempSrc:  Oral Oral Oral  SpO2:  95% 96% 95%  Weight:      Height:        GEN- The patient is well appearing, alert and oriented x 3 today.   HEENT: normocephalic, atraumatic; sclera clear, conjunctiva pink; hearing intact; oropharynx clear; neck supple  Lungs- CTA b/l, normal work of breathing.  No wheezes, rales, rhonchi Heart- RRR, no murmurs, rubs or gallops  GI- soft, non-tender, non-distended, bowel sounds present  Extremities- no clubbing, cyanosis, or edema; R groin is stable without hematoma/bruit MS- no significant deformity or atrophy Skin- warm and dry, no rash or lesion Psych- euthymic mood, full affect Neuro- strength and sensation are intact   Labs:   Lab Results  Component Value Date   WBC 7.5 07/31/2020   HGB 16.6 07/31/2020   HCT 50.3 07/31/2020   MCV 96.9 07/31/2020   PLT 213 07/31/2020    Recent Labs  Lab 08/04/20 0103  NA 133*  K 4.2  CL 99  CO2 28  BUN 19  CREATININE 1.38*  CALCIUM 9.4  GLUCOSE 153*     Discharge Medications:  Allergies as of 08/04/2020   No Known Allergies     Medication List    TAKE these medications  aspirin EC 81 MG tablet Take 1 tablet (81 mg total) by mouth daily.   atorvastatin 80 MG tablet Commonly known as: LIPITOR Take 80 mg by mouth every evening.   chlorthalidone 25 MG tablet Commonly known as: HYGROTON Take 12.5 mg by mouth daily.   Combigan 0.2-0.5 % ophthalmic solution Generic drug: brimonidine-timolol Place 1 drop into both eyes 2 (two) times daily.   diltiazem 240 MG 24 hr capsule Commonly known as: TIAZAC Take 240 mg by mouth daily.   Eliquis 5 MG Tabs  tablet Generic drug: apixaban TAKE 1 TABLET BY MOUTH TWICE A DAY What changed: how much to take   ezetimibe 10 MG tablet Commonly known as: ZETIA TAKE ONE TABLET BY MOUTH ONE TIME DAILY   Lumigan 0.01 % Soln Generic drug: bimatoprost Place 1 drop into both eyes at bedtime.   metFORMIN 500 MG tablet Commonly known as: GLUCOPHAGE Take 500 mg by mouth daily with breakfast.   metoprolol tartrate 25 MG tablet Commonly known as: LOPRESSOR Take 25 mg by mouth 2 (two) times daily.   valsartan 320 MG tablet Commonly known as: DIOVAN Take 320 mg by mouth daily.   zolpidem 12.5 MG CR tablet Commonly known as: AMBIEN CR Take 12.5 mg by mouth at bedtime.       Disposition: Home Discharge Instructions    Diet - low sodium heart healthy   Complete by: As directed    Increase activity slowly   Complete by: As directed       Follow-up Information    [facility].   Specialty: Cardiology Why: 09/12/20 @ 11:00AM with R. Fenton, PA-C Contact information: [address] [id number] [place] [phone]              Duration of Discharge Encounter: Greater than 30 minutes including physician time.  Signed, Baldwin [place], PA-C  08/04/2020 9:52 AM

## 2020-08-03 NOTE — Interval H&P Note (Signed)
History and Physical Interval Note:  08/03/2020 9:19 AM  Norman [name]  has presented today for surgery, with the diagnosis of afib.  The various methods of treatment have been discussed with the patient and family. After consideration of risks, benefits and other options for treatment, the patient has consented to  Procedure(s): LEFT ATRIAL APPENDAGE OCCLUSION (N/A) TRANSESOPHAGEAL ECHOCARDIOGRAM (TEE) (N/A) as a surgical intervention.  The patient's history has been reviewed, patient examined, no change in status, stable for surgery.  I have reviewed the patient's chart and labs.  Questions were answered to the patient's satisfaction.    Discussed procedure in detail with the patient including the risks and he wishes to proceed.  Procedural risks for the Watchman implant have been reviewed with the patient including bleeding, vascular damage, death, a 0.5% risk of stroke, <1% risk of perforation and <1% risk of device embolization.    Lysbeth Galas T. Quentin Ore, MD, [facility], [facility] Cardiac Electrophysiology

## 2020-08-03 NOTE — Progress Notes (Signed)
  Echocardiogram Echocardiogram Transesophageal with color, doppler, and 3D has been performed.  Darlina Sicilian M 08/03/2020, 11:09 AM

## 2020-08-03 NOTE — Plan of Care (Signed)
  Problem: Education: °Goal: Knowledge of General Education information will improve °Description: Including pain rating scale, medication(s)/side effects and non-pharmacologic comfort measures °Outcome: Progressing °  °Problem: Health Behavior/Discharge Planning: °Goal: Ability to manage health-related needs will improve °Outcome: Progressing °  °Problem: Clinical Measurements: °Goal: Ability to maintain clinical measurements within normal limits will improve °Outcome: Progressing °  °Problem: Clinical Measurements: °Goal: Diagnostic test results will improve °Outcome: Progressing °  °Problem: Activity: °Goal: Risk for activity intolerance will decrease °Outcome: Progressing °  °Problem: Nutrition: °Goal: Adequate nutrition will be maintained °Outcome: Progressing °  °Problem: Pain Managment: °Goal: General experience of comfort will improve °Outcome: Progressing °  °Problem: Skin Integrity: °Goal: Risk for impaired skin integrity will decrease °Outcome: Progressing °  °

## 2020-08-03 NOTE — Anesthesia Procedure Notes (Signed)
Procedure Name: Intubation Date/Time: 08/03/2020 9:46 AM Performed by: [name], CRNA Pre-anesthesia Checklist: Patient identified, Emergency Drugs available, Suction available and Patient being monitored Patient Re-evaluated:Patient Re-evaluated prior to induction Oxygen Delivery Method: Circle System Utilized Preoxygenation: Pre-oxygenation with 100% oxygen Induction Type: IV induction Ventilation: Mask ventilation without difficulty Laryngoscope Size: Mac and 4 Grade View: Grade II Tube type: Oral Number of attempts: 1 Airway Equipment and Method: Stylet Placement Confirmation: ETT inserted through vocal cords under direct vision,  positive ETCO2 and breath sounds checked- equal and bilateral Secured at: 24 cm Tube secured with: Tape Dental Injury: Teeth and Oropharynx as per pre-operative assessment

## 2020-08-04 ENCOUNTER — Encounter (HOSPITAL_COMMUNITY): Payer: Self-pay | Admitting: Cardiology

## 2020-08-04 LAB — BASIC METABOLIC PANEL
Anion gap: 6 (ref 5–15)
BUN: 19 mg/dL (ref 8–23)
CO2: 28 mmol/L (ref 22–32)
Calcium: 9.4 mg/dL (ref 8.9–10.3)
Chloride: 99 mmol/L (ref 98–111)
Creatinine, Ser: 1.38 mg/dL — ABNORMAL HIGH (ref 0.61–1.24)
GFR, Estimated: 53 mL/min — ABNORMAL LOW (ref >60)
Glucose, Bld: 153 mg/dL — ABNORMAL HIGH (ref 70–99)
Potassium: 4.2 mmol/L (ref 3.5–5.1)
Sodium: 133 mmol/L — ABNORMAL LOW (ref 135–145)

## 2020-08-04 NOTE — Discharge Instructions (Signed)
[facility] Procedure, Care After  Procedure MD: Dr. Benson Norway Clinical Coordinator: Ambrose Pancoast, RN  This sheet gives you information about how to care for yourself after your procedure. Your health care provider may also give you more specific instructions. If you have problems or questions, contact your health care provider.  What can I expect after the procedure? After the procedure, it is common to have:  Bruising around your puncture site.  Tenderness around your puncture site.  Tiredness (fatigue).  Medication instructions . It is very important to continue to take your blood thinner as directed by your doctor after the Watchman procedure. Call your procedure doctor's office with question or concerns. . If you are on Coumadin (warfarin), you will have your INR checked the week after your procedure, with a goal INR of 2.0 - 3.0. Marland Kitchen Please follow your medication instructions on your discharge summary. Only take the medications listed on your discharge paperwork.  Follow up . You will be seen in 1 month after your procedure in the Atrial Fibrillation Clinic . You will have another TEE (Transesophageal Echocardiogram) approximately 6 weeks after your procedure mark to check your device, scheduled for 09/14/20 . You will follow up the MD/APP who performed your procedure 6 months after your procedure . The Watchman Clinical Coordinator will check in with you from time to time, including 1 and 2 years after your procedure.    Follow these instructions at home: Puncture site care   Follow instructions from your health care provider about how to take care of your puncture site. Make sure you: ? If present, leave stitches (sutures), skin glue, or adhesive strips in place.  ? If a large square bandage is present, this may be removed 24 hours after surgery.   Check your puncture site every day for signs of infection. Check for: ? Redness, swelling, or pain. ? Fluid or blood. If  your puncture site starts to bleed, lie down on your back, apply firm pressure to the area, and contact your health care provider. ? Warmth. ? Pus or a bad smell. Driving  Do not drive yourself home if you received sedation  Do not drive for at least 4 days after your procedure or however long your health care provider recommends. (Do not resume driving if you have previously been instructed not to drive for other health reasons.)  Do not spend greater than 1 hour at a time in a car for the first 3 days. Stop and take a break with a 5 minute walk at least every hour.   Do not drive or use heavy machinery while taking prescription pain medicine.  Activity  Avoid activities that take a lot of effort, including exercise, for at least 7 days after your procedure.  For the first 3 days, avoid sitting for longer than one hour at a time.   Avoid alcoholic beverages, signing paperwork, or participating in legal proceedings for 24 hours after receiving sedation  Do not lift anything that is heavier than 10 lb (4.5 kg) for one week.   No sexual activity for 1 week.   Return to your normal activities as told by your health care provider. Ask your health care provider what activities are safe for you. General instructions  Take over-the-counter and prescription medicines only as told by your health care provider.  Do not use any products that contain nicotine or tobacco, such as cigarettes and e-cigarettes. If you need help quitting, ask your health care provider.  You may shower after 24 hours, but Do not take baths, swim, or use a hot tub for 1 week.   Do not drink alcohol for 24 hours after your procedure.  Keep all follow-up visits as told by your health care provider. This is important.  Dental Work: You will require antibiotics prior to any dental work, including cleanings, for 6 months after your Watchman implantation to help protect you from infection. After 6 months, antibiotics  are no longer required. Contact a health care provider if:  You have redness, mild swelling, or pain around your puncture site.  You have soreness in your throat or at your puncture site that does not improve after several days  You have fluid or blood coming from your puncture site that stops after applying firm pressure to the area.  Your puncture site feels warm to the touch.  You have pus or a bad smell coming from your puncture site.  You have a fever.  You have chest pain or discomfort that spreads to your neck, jaw, or arm.  You are sweating a lot.  You feel nauseous.  You have a fast or irregular heartbeat.  You have shortness of breath.  You are dizzy or light-headed and feel the need to lie down.  You have pain or numbness in the arm or leg closest to your puncture site. Get help right away if:  Your puncture site suddenly swells.  Your puncture site is bleeding and the bleeding does not stop after applying firm pressure to the area. These symptoms may represent a serious problem that is an emergency. Do not wait to see if the symptoms will go away. Get medical help right away. Call your local emergency services (911 in the [place] Do not drive yourself to the hospital. Summary  After the procedure, it is normal to have bruising and tenderness at the puncture site in your groin, neck, or forearm.  Check your puncture site every day for signs of infection.  Get help right away if your puncture site is bleeding and the bleeding does not stop after applying firm pressure to the area. This is a medical emergency.  This information is not intended to replace advice given to you by your health care provider. Make sure you discuss any questions you have with your health care provider.

## 2020-08-04 NOTE — Anesthesia Postprocedure Evaluation (Signed)
Anesthesia Post Note  Patient: Norman [name]  Procedure(s) Performed: LEFT ATRIAL APPENDAGE OCCLUSION (N/A ) TRANSESOPHAGEAL ECHOCARDIOGRAM (TEE) (N/A )     Patient location during evaluation: Cath Lab Anesthesia Type: General Level of consciousness: awake and alert Pain management: pain level controlled Vital Signs Assessment: post-procedure vital signs reviewed and stable Respiratory status: spontaneous breathing, nonlabored ventilation, respiratory function stable and patient connected to nasal cannula oxygen Cardiovascular status: blood pressure returned to baseline and stable Postop Assessment: no apparent nausea or vomiting Anesthetic complications: no   No complications documented.  Last Vitals:  Vitals:   08/04/20 0300 08/04/20 0734  BP: 134/82 118/82  Pulse: 77 75  Resp: 16   Temp: 36.6 C 36.7 C  SpO2: 96% 95%    Last Pain:  Vitals:   08/04/20 0734  TempSrc: Oral  PainSc:                  [name]

## 2020-08-07 ENCOUNTER — Telehealth: Payer: Self-pay | Admitting: Nurse Practitioner

## 2020-08-07 NOTE — Telephone Encounter (Addendum)
   Post Watchman follow up call made to [facility] regarding Watchman implant on 5/12. .Patient reports no complications or concern at this time. We reviewed [facility] medication regimen and also discussed upcoming 45 day TEE appointment. Patient agrees with treatment regimen and next steps in the Watchman process. Contact information provided and instructed to call me with any question before TEE appointment.    Ambrose Pancoast, RN Watchman Coordinator

## 2020-08-11 DIAGNOSIS — D6869 Other thrombophilia: Secondary | ICD-10-CM | POA: Diagnosis not present

## 2020-08-11 DIAGNOSIS — E782 Mixed hyperlipidemia: Secondary | ICD-10-CM | POA: Diagnosis not present

## 2020-08-11 DIAGNOSIS — I10 Essential (primary) hypertension: Secondary | ICD-10-CM | POA: Diagnosis not present

## 2020-08-11 DIAGNOSIS — M19079 Primary osteoarthritis, unspecified ankle and foot: Secondary | ICD-10-CM | POA: Diagnosis not present

## 2020-08-11 DIAGNOSIS — I4891 Unspecified atrial fibrillation: Secondary | ICD-10-CM | POA: Diagnosis not present

## 2020-08-11 DIAGNOSIS — G4700 Insomnia, unspecified: Secondary | ICD-10-CM | POA: Diagnosis not present

## 2020-08-11 DIAGNOSIS — E1169 Type 2 diabetes mellitus with other specified complication: Secondary | ICD-10-CM | POA: Diagnosis not present

## 2020-08-11 DIAGNOSIS — I69398 Other sequelae of cerebral infarction: Secondary | ICD-10-CM | POA: Diagnosis not present

## 2020-08-11 DIAGNOSIS — Z8673 Personal history of transient ischemic attack (TIA), and cerebral infarction without residual deficits: Secondary | ICD-10-CM | POA: Diagnosis not present

## 2020-09-12 ENCOUNTER — Other Ambulatory Visit (HOSPITAL_COMMUNITY)
Admission: RE | Admit: 2020-09-12 | Discharge: 2020-09-12 | Disposition: A | Discharge status: Home / Self Care | Payer: Medicare PPO | Source: Ambulatory Visit | Attending: Cardiovascular Disease | Admitting: Cardiovascular Disease

## 2020-09-12 ENCOUNTER — Ambulatory Visit (HOSPITAL_COMMUNITY)
Admit: 2020-09-12 | Discharge: 2020-09-12 | Disposition: A | Discharge status: Home / Self Care | Payer: Medicare PPO | Source: Ambulatory Visit | Attending: Physician Assistant | Admitting: Physician Assistant

## 2020-09-12 ENCOUNTER — Encounter (HOSPITAL_COMMUNITY): Payer: Self-pay | Admitting: Physician Assistant

## 2020-09-12 ENCOUNTER — Other Ambulatory Visit: Payer: Self-pay

## 2020-09-12 VITALS — BP 138/80 | HR 70 | Ht 72.0 in | Wt 219.2 lb

## 2020-09-12 DIAGNOSIS — D6869 Other thrombophilia: Secondary | ICD-10-CM | POA: Insufficient documentation

## 2020-09-12 DIAGNOSIS — E785 Hyperlipidemia, unspecified: Secondary | ICD-10-CM | POA: Insufficient documentation

## 2020-09-12 DIAGNOSIS — U071 COVID-19: Secondary | ICD-10-CM | POA: Diagnosis not present

## 2020-09-12 DIAGNOSIS — I4821 Permanent atrial fibrillation: Secondary | ICD-10-CM | POA: Diagnosis not present

## 2020-09-12 DIAGNOSIS — Z7984 Long term (current) use of oral hypoglycemic drugs: Secondary | ICD-10-CM | POA: Insufficient documentation

## 2020-09-12 DIAGNOSIS — Z8673 Personal history of transient ischemic attack (TIA), and cerebral infarction without residual deficits: Secondary | ICD-10-CM | POA: Diagnosis not present

## 2020-09-12 DIAGNOSIS — Z7901 Long term (current) use of anticoagulants: Secondary | ICD-10-CM | POA: Insufficient documentation

## 2020-09-12 DIAGNOSIS — I6529 Occlusion and stenosis of unspecified carotid artery: Secondary | ICD-10-CM | POA: Insufficient documentation

## 2020-09-12 DIAGNOSIS — Z7982 Long term (current) use of aspirin: Secondary | ICD-10-CM | POA: Diagnosis not present

## 2020-09-12 DIAGNOSIS — Z01812 Encounter for preprocedural laboratory examination: Secondary | ICD-10-CM | POA: Insufficient documentation

## 2020-09-12 DIAGNOSIS — I10 Essential (primary) hypertension: Secondary | ICD-10-CM | POA: Insufficient documentation

## 2020-09-12 DIAGNOSIS — Z79899 Other long term (current) drug therapy: Secondary | ICD-10-CM | POA: Diagnosis not present

## 2020-09-12 DIAGNOSIS — I2510 Atherosclerotic heart disease of native coronary artery without angina pectoris: Secondary | ICD-10-CM | POA: Diagnosis not present

## 2020-09-12 LAB — BASIC METABOLIC PANEL
Anion gap: 9 (ref 5–15)
BUN: 17 mg/dL (ref 8–23)
CO2: 26 mmol/L (ref 22–32)
Calcium: 10.3 mg/dL (ref 8.9–10.3)
Chloride: 98 mmol/L (ref 98–111)
Creatinine, Ser: 1.29 mg/dL — ABNORMAL HIGH (ref 0.61–1.24)
GFR, Estimated: 57 mL/min — ABNORMAL LOW (ref >60)
Glucose, Bld: 138 mg/dL — ABNORMAL HIGH (ref 70–99)
Potassium: 4.4 mmol/L (ref 3.5–5.1)
Sodium: 133 mmol/L — ABNORMAL LOW (ref 135–145)

## 2020-09-12 LAB — CBC
HCT: 52.4 % — ABNORMAL HIGH (ref 39.0–52.0)
Hemoglobin: 17.4 g/dL — ABNORMAL HIGH (ref 13.0–17.0)
MCH: 31.6 pg (ref 26.0–34.0)
MCHC: 33.2 g/dL (ref 30.0–36.0)
MCV: 95.3 fL (ref 80.0–100.0)
Platelets: 274 10*3/uL (ref 150–400)
RBC: 5.5 MIL/uL (ref 4.22–5.81)
RDW: 12.7 % (ref 11.5–15.5)
WBC: 8.7 10*3/uL (ref 4.0–10.5)
nRBC: 0 % (ref 0.0–0.2)

## 2020-09-12 LAB — SARS CORONAVIRUS 2 (TAT 6-24 HRS): SARS Coronavirus 2: POSITIVE — AB

## 2020-09-12 NOTE — Patient Instructions (Signed)
TEE scheduled for Thursday, June 23rd  - Arrive at the [facility] and go to admitting at 730AM  - Do not eat or drink anything after midnight the night prior to your procedure.  - Take all your morning medication (except diabetic medications) with a sip of water prior to arrival.  - You will not be able to drive home after your procedure.  Patients will be asked to: to mask in public and hand hygiene (no longer quarantine) in the 3 days prior to surgery, to report if any COVID-19-like illness or household contacts to COVID-19 to determine need for testing

## 2020-09-12 NOTE — H&P (View-Only) (Signed)
  Primary Care Physician: Swayne, David, MD Primary Cardiologist: Dr Turner Primary Electrophysiologist: Dr Lambert  Referring Physician: Dr Lambert   Norman [name] is a 75 y.o. male with a history of carotid artery stenosis, DM, prior CVA, CAD, HLD, HTN, atrial fibrillation who presents for follow up in the [facility]. Patient is on Eliquis for a CHADS2VASC score of 7. Patient was seen by Dr Lambert for Watchman device consideration. He is not felt to be a candidate for long term anticoagulation due to hematuria. He is scheduled for Watchman implant 08/03/20.   On follow up today, patient is s/p Watchman implant 08/03/20. He denies any CP or groin issues. He also denies any current bleeding issues. He is scheduled for TEE on 09/14/20.  Today, he denies symptoms of palpitations, chest pain, shortness of breath, orthopnea, PND, lower extremity edema, dizziness, presyncope, syncope, snoring, daytime somnolence, bleeding, or neurologic sequela. The patient is tolerating medications without difficulties and is otherwise without complaint today.    Atrial Fibrillation Risk Factors:  he does not have symptoms or diagnosis of sleep apnea. he does not have a history of rheumatic fever.   he has a BMI of Body mass index is 29.73 kg/m.. Filed Weights   09/12/20 1058  Weight: 99.4 kg     Family History  Problem Relation Age of Onset   Kidney failure Mother    Heart attack Father        stenosis   Diabetes Maternal Aunt      Atrial Fibrillation Management history:  Previous antiarrhythmic drugs: none Previous cardioversions: remotely  Previous ablations: none CHADS2VASC score: 7 Anticoagulation history: Eliquis   Past Medical History:  Diagnosis Date   Anxiety    Blood in urine 2 1/2 WEEKS AGO   BPH (benign prostatic hypertrophy)    Mild   Coronary artery calcification seen on CAT scan 2011   no ischemia on nuclear stress test 2011    Dilated aortic root (HCC)    37mm by echo 2017   Dysrhythmia    afib   Epididymal cyst 2005   Bilateral and small bilateral hydroceles with most recent ultra sound   Glaucoma    History of kidney stones    Hypercholesteremia    Hypertension    Kidney stones    "I've passed them all"   Migraine    "got off caffeine; they went away"   Permanent atrial fibrillation (HCC)    Stroke (HCC)    TIA ? - 2009    Tendinitis    History in Rotator cuff   TIA (transient ischemic attack) ?12/28/2013   Type II diabetes mellitus (HCC)    Past Surgical History:  Procedure Laterality Date   CARDIOVERSION  X 1   CYSTOSCOPY WITH LITHOLAPAXY N/A 12/05/2017   Procedure: CYSTOSCOPY WITH LITHOLAPAXY;  Surgeon: Ottelin, Mark, MD;  Location: [facility];  Service: Urology;  Laterality: N/A;   CYSTOSCOPY WITH RETROGRADE PYELOGRAM, URETEROSCOPY AND STENT PLACEMENT Right 08/18/2015   Procedure: CYSTOSCOPY WITH RIGHT  RETROGRADE PYELOGRAM, RIGHT URETEROGRAM.;  Surgeon: Mark Ottelin, MD;  Location: [facility];  Service: Urology;  Laterality: Right;   LEFT ATRIAL APPENDAGE OCCLUSION N/A 08/03/2020   Procedure: LEFT ATRIAL APPENDAGE OCCLUSION;  Surgeon: Lambert, Cameron T, MD;  [facility]: [facility];  Service: Cardiovascular;  Laterality: N/A;   TEE WITHOUT CARDIOVERSION N/A 08/03/2020   Procedure: TRANSESOPHAGEAL ECHOCARDIOGRAM (TEE);  Surgeon: Lambert, Cameron T, MD;  Location: [facility];    Service: Cardiovascular;  Laterality: N/A;   TONSILLECTOMY      Current Outpatient Medications  Medication Sig Dispense Refill   acetaminophen (TYLENOL) 500 MG tablet Take 1,000 mg by mouth every 6 (six) hours as needed for moderate pain or headache.     aspirin EC 81 MG tablet Take 1 tablet (81 mg total) by mouth daily. 150 tablet 2   atorvastatin (LIPITOR) 80 MG tablet Take 80 mg by mouth every evening.      chlorthalidone (HYGROTON) 25 MG tablet Take 12.5 mg by mouth daily.      COMBIGAN 0.2-0.5 % ophthalmic  solution Place 1 drop into both eyes 2 (two) times daily.     diltiazem (TIAZAC) 240 MG 24 hr capsule Take 240 mg by mouth daily.     ELIQUIS 5 MG TABS tablet TAKE 1 TABLET BY MOUTH TWICE A DAY 180 tablet 1   ezetimibe (ZETIA) 10 MG tablet TAKE ONE TABLET BY MOUTH ONE TIME DAILY 90 tablet 3   LUMIGAN 0.01 % SOLN Place 1 drop into both eyes at bedtime.     metFORMIN (GLUCOPHAGE) 500 MG tablet Take 500 mg by mouth daily with breakfast.     metoprolol tartrate (LOPRESSOR) 25 MG tablet Take 25 mg by mouth 2 (two) times daily.     OVER THE COUNTER MEDICATION Apply 1 application topically 3 (three) times a week. O'Keeffe's Healthy Feet Cream     valsartan (DIOVAN) 320 MG tablet Take 320 mg by mouth daily.     zolpidem (AMBIEN CR) 12.5 MG CR tablet Take 12.5 mg by mouth at bedtime.     No current facility-administered medications for this encounter.    No Known Allergies  Social History   Socioeconomic History   Marital status: Married    Spouse name: Not on file   Number of children: 2   Years of education: college   Highest education level: Not on file  Occupational History   Not on file  Tobacco Use   Smoking status: Never   Smokeless tobacco: Never  Vaping Use   Vaping Use: Never used  Substance and Sexual Activity   Alcohol use: Yes    Alcohol/week: 1.0 standard drink    Types: 1 Glasses of wine per week    Comment: occ   Drug use: No   Sexual activity: Not on file  Other Topics Concern   Not on file  Social History Narrative   Patient is married with 2 children.   Patient is right handed.   Patient has college education.   Patient drinks very little caffeine.   Social Determinants of Health   Financial Resource Strain: Not on file  Food Insecurity: Not on file  Transportation Needs: Not on file  Physical Activity: Not on file  Stress: Not on file  Social Connections: Not on file  Intimate Partner Violence: Not on file     ROS- All systems are reviewed and  negative except as per the HPI above.  Physical Exam: Vitals:   09/12/20 1058  BP: 138/80  Pulse: 70  SpO2: 97%  Weight: 99.4 kg  Height: 6' (1.829 m)    GEN- The patient is a well appearing elderly male, alert and oriented x 3 today.   HEENT-head normocephalic, atraumatic, sclera clear, conjunctiva pink, hearing intact, trachea midline. Lungs- Clear to ausculation bilaterally, normal work of breathing Heart- irregular rate and rhythm, no murmurs, rubs or gallops  GI- soft, NT, ND, + BS Extremities- no clubbing,   cyanosis, or edema MS- no significant deformity or atrophy Skin- no rash or lesion Psych- euthymic mood, full affect Neuro- strength and sensation are intact   Wt Readings from Last 3 Encounters:  09/12/20 99.4 kg  08/03/20 100.8 kg  07/31/20 100.8 kg    EKG is not ordered today.  Echo 11/19/18 demonstrated  1. The left ventricle has normal systolic function with an ejection  fraction of 60-65%. The cavity size was normal. There is mildly increased  left ventricular wall thickness. Left ventricular diastolic function could  not be evaluated secondary to atrial   fibrillation.   2. Left atrial size was mildly dilated.   3. There is mild mitral annular calcification present.   4. The aortic valve is tricuspid. Mild sclerosis of the aortic valve.  Aortic valve regurgitation was not assessed by color flow Doppler.   5. The aorta is normal unless otherwise noted.   Cardiac CT 07/17/20 The left atrial appendage is large chicken wing type with two lobes that is directed anteriorly. Thrombus: There is mixing artifact in the LAA on contrast imaging, but there is no thrombus on delayed imaging. Landing Zone measurement: 25.4 mm x 21.4 mm LAA Length (maximum): 21.1 mm Watchman FLX Device: A 31 mm device is recommended with 18% compression.  Epic records are reviewed at length today  HAS-BLED score 4 Hypertension Yes  Abnormal renal and liver function (Dialysis,  transplant, Cr >2.26 mg/dL /Cirrhosis or Bilirubin >2x Normal or AST/ALT/AP >3x Normal) No  Stroke Yes  Bleeding Yes  Labile INR (Unstable/high INR) No  Elderly (>65) Yes  Drugs or alcohol (? 8 drinks/week, anti-plt or NSAID) No   CHA2DS2-VASc Score = 7  The patient's score is based upon: CHF History: No HTN History: Yes Diabetes History: Yes Stroke History: Yes Vascular Disease History: Yes Age Score: 2 Gender Score: 0      ASSESSMENT AND PLAN: 1. Permanent Atrial Fibrillation (ICD10:  I48.11) The patient's CHA2DS2-VASc score is 7, indicating a 11.2% annual risk of stroke.   S/p Watchman implant 08/03/20 Scheduled for TEE on 09/14/20. If criteria met, will change Eliquis to Plavix. (Day 45 6/26) Check bmet/cbc Continue Eliquis 5 mg BID and ASA 81 mg daily for now.  2. Secondary Hypercoagulable State (ICD10:  D68.69) The patient is at significant risk for stroke/thromboembolism based upon his CHA2DS2-VASc Score of 7.  Continue Apixaban (Eliquis) for now. See plans above.  3. HTN Stable, no changes today.  4. CAD Noted on CT No anginal symptoms.   Follow up for TEE on 09/14/20 and with Dr Lambert for 3 month post implant.   Ricky Patte Winkel PA-C [facility] [address] [place], [place] [address] [phone] 09/12/2020 11:11 AM 

## 2020-09-12 NOTE — Progress Notes (Addendum)
Primary Care Physician: Antony Contras, MD Primary Cardiologist: Dr Radford Pax Primary Electrophysiologist: Dr Quentin Ore  Referring Physician: Dr Penni Homans III is a 75 y.o. male with a history of carotid artery stenosis, DM, prior CVA, CAD, HLD, HTN, atrial fibrillation who presents for follow up in the [facility]. Patient is on Eliquis for a CHADS2VASC score of 7. Patient was seen by Dr Quentin Ore for [facility] device consideration. He is not felt to be a candidate for long term anticoagulation due to hematuria. He is scheduled for Watchman implant 08/03/20.   On follow up today, patient is s/p Watchman implant 08/03/20. He denies any CP or groin issues. He also denies any current bleeding issues. He is scheduled for TEE on 09/14/20.  Today, he denies symptoms of palpitations, chest pain, shortness of breath, orthopnea, PND, lower extremity edema, dizziness, presyncope, syncope, snoring, daytime somnolence, bleeding, or neurologic sequela. The patient is tolerating medications without difficulties and is otherwise without complaint today.    Atrial Fibrillation Risk Factors:  he does not have symptoms or diagnosis of sleep apnea. he does not have a history of rheumatic fever.   he has a BMI of Body mass index is 29.73 kg/m.Marland Kitchen Filed Weights   09/12/20 1058  Weight: 99.4 kg     Family History  Problem Relation Age of Onset   Kidney failure Mother    Heart attack Father        stenosis   Diabetes Maternal Aunt      Atrial Fibrillation Management history:  Previous antiarrhythmic drugs: none Previous cardioversions: remotely  Previous ablations: none CHADS2VASC score: 7 Anticoagulation history: Eliquis   Past Medical History:  Diagnosis Date   Anxiety    Blood in urine 2 1/2 WEEKS AGO   BPH (benign prostatic hypertrophy)    Mild   Coronary artery calcification seen on CAT scan 2011   no ischemia on nuclear stress test 2011    Dilated aortic root (HCC)    39m by echo 2017   Dysrhythmia    afib   Epididymal cyst 2005   Bilateral and small bilateral hydroceles with most recent ultra sound   Glaucoma    History of kidney stones    Hypercholesteremia    Hypertension    Kidney stones    "I've passed them all"   Migraine    "got off caffeine; they went away"   Permanent atrial fibrillation ([facility]    [facility]    TIA ? - 2009    Tendinitis    History in Rotator cuff   TIA (transient ischemic attack) ?12/28/2013   [place] II diabetes mellitus ([place]    Past Surgical History:  Procedure Laterality Date   CARDIOVERSION  X 1   CYSTOSCOPY WITH LITHOLAPAXY N/A 12/05/2017   Procedure: CYSTOSCOPY WITH LITHOLAPAXY;  Surgeon: OKathie Rhodes MD;  [facility]: [facility];  Service: Urology;  Laterality: N/A;   CYSTOSCOPY WITH RETROGRADE PYELOGRAM, URETEROSCOPY AND STENT PLACEMENT Right 08/18/2015   Procedure: CYSTOSCOPY WITH RIGHT  RETROGRADE PYELOGRAM, RIGHT URETEROGRAM.;  Surgeon: MKathie Rhodes MD;  Location: [facility];  Service: Urology;  Laterality: Right;   LEFT ATRIAL APPENDAGE OCCLUSION N/A 08/03/2020   Procedure: LEFT ATRIAL APPENDAGE OCCLUSION;  Surgeon: LVickie Epley MD;  [facility];  Service: Cardiovascular;  Laterality: N/A;   TEE WITHOUT CARDIOVERSION N/A 08/03/2020   Procedure: TRANSESOPHAGEAL ECHOCARDIOGRAM (TEE);  Surgeon: LVickie Epley MD;  [facility];  Service: Cardiovascular;  Laterality: N/A;   TONSILLECTOMY      Current Outpatient Medications  Medication Sig Dispense Refill   acetaminophen (TYLENOL) 500 MG tablet Take 1,000 mg by mouth every 6 (six) hours as needed for moderate pain or headache.     aspirin EC 81 MG tablet Take 1 tablet (81 mg total) by mouth daily. 150 tablet 2   atorvastatin (LIPITOR) 80 MG tablet Take 80 mg by mouth every evening.      chlorthalidone (HYGROTON) 25 MG tablet Take 12.5 mg by mouth daily.      COMBIGAN 0.2-0.5 % ophthalmic  solution Place 1 drop into both eyes 2 (two) times daily.     diltiazem (TIAZAC) 240 MG 24 hr capsule Take 240 mg by mouth daily.     ELIQUIS 5 MG TABS tablet TAKE 1 TABLET BY MOUTH TWICE A DAY 180 tablet 1   ezetimibe (ZETIA) 10 MG tablet TAKE ONE TABLET BY MOUTH ONE TIME DAILY 90 tablet 3   LUMIGAN 0.01 % SOLN Place 1 drop into both eyes at bedtime.     metFORMIN (GLUCOPHAGE) 500 MG tablet Take 500 mg by mouth daily with breakfast.     metoprolol tartrate (LOPRESSOR) 25 MG tablet Take 25 mg by mouth 2 (two) times daily.     OVER THE COUNTER MEDICATION Apply 1 application topically 3 (three) times a week. O'Keeffe's Healthy Feet Cream     valsartan (DIOVAN) 320 MG tablet Take 320 mg by mouth daily.     zolpidem (AMBIEN CR) 12.5 MG CR tablet Take 12.5 mg by mouth at bedtime.     No current facility-administered medications for this encounter.    No Known Allergies  Social History   Socioeconomic History   Marital status: Married    Spouse name: Not on file   Number of children: 2   Years of education: college   Highest education level: Not on file  Occupational History   Not on file  Tobacco Use   Smoking status: Never   Smokeless tobacco: Never  Vaping Use   Vaping Use: Never used  Substance and Sexual Activity   Alcohol use: Yes    Alcohol/week: 1.0 standard drink    Types: 1 Glasses of wine per week    Comment: occ   Drug use: No   Sexual activity: Not on file  Other Topics Concern   Not on file  Social History Narrative   Patient is married with 2 children.   Patient is right handed.   Patient has college education.   Patient drinks very little caffeine.   Social Determinants of Health   Financial Resource Strain: Not on file  Food Insecurity: Not on file  Transportation Needs: Not on file  Physical Activity: Not on file  Stress: Not on file  Social Connections: Not on file  Intimate Partner Violence: Not on file     ROS- All systems are reviewed and  negative except as per the HPI above.  Physical Exam: Vitals:   09/12/20 1058  BP: 138/80  Pulse: 70  SpO2: 97%  Weight: 99.4 kg  Height: 6' (1.829 m)    GEN- The patient is a well appearing elderly male, alert and oriented x 3 today.   HEENT-head normocephalic, atraumatic, sclera clear, conjunctiva pink, hearing intact, trachea midline. Lungs- Clear to ausculation bilaterally, normal work of breathing Heart- irregular rate and rhythm, no murmurs, rubs or gallops  GI- soft, NT, ND, + BS Extremities- no clubbing,  cyanosis, or edema MS- no significant deformity or atrophy Skin- no rash or lesion Psych- euthymic mood, full affect Neuro- strength and sensation are intact   Wt Readings from Last 3 Encounters:  09/12/20 99.4 kg  08/03/20 100.8 kg  07/31/20 100.8 kg    EKG is not ordered today.  Echo 11/19/18 demonstrated  1. The left ventricle has normal systolic function with an ejection  fraction of 60-65%. The cavity size was normal. There is mildly increased  left ventricular wall thickness. Left ventricular diastolic function could  not be evaluated secondary to atrial   fibrillation.   2. Left atrial size was mildly dilated.   3. There is mild mitral annular calcification present.   4. The aortic valve is tricuspid. Mild sclerosis of the aortic valve.  Aortic valve regurgitation was not assessed by color flow Doppler.   5. The aorta is normal unless otherwise noted.   Cardiac CT 07/17/20 The left atrial appendage is large chicken wing type with two lobes that is directed anteriorly. Thrombus: There is mixing artifact in the LAA on contrast imaging, but there is no thrombus on delayed imaging. Landing Zone measurement: 25.4 mm x 21.4 mm LAA Length (maximum): 21.1 mm Watchman FLX Device: A 31 mm device is recommended with 18% compression.  Epic records are reviewed at length today  HAS-BLED score 4 Hypertension Yes  Abnormal renal and liver function (Dialysis,  transplant, Cr >2.26 mg/dL /Cirrhosis or Bilirubin >2x Normal or AST/ALT/AP >3x Normal) No  Stroke Yes  Bleeding Yes  Labile INR (Unstable/high INR) No  Elderly (>65) Yes  Drugs or alcohol (? 8 drinks/week, anti-plt or NSAID) No   CHA2DS2-VASc Score = 7  The patient's score is based upon: CHF History: No HTN History: Yes Diabetes History: Yes Stroke History: Yes Vascular Disease History: Yes Age Score: 2 Gender Score: 0      ASSESSMENT AND PLAN: 1. Permanent Atrial Fibrillation (ICD10:  I48.11) The patient's CHA2DS2-VASc score is 7, indicating a 11.2% annual risk of stroke.   S/p Watchman implant 08/03/20 Scheduled for TEE on 09/14/20. If criteria met, will change Eliquis to Plavix. (Day 45 6/26) Check bmet/cbc Continue Eliquis 5 mg BID and ASA 81 mg daily for now.  2. Secondary Hypercoagulable State (ICD10:  D68.69) The patient is at significant risk for stroke/thromboembolism based upon his CHA2DS2-VASc Score of 7.  Continue Apixaban (Eliquis) for now. See plans above.  3. HTN Stable, no changes today.  4. CAD Noted on CT No anginal symptoms.   Follow up for TEE on 09/14/20 and with Dr Quentin Ore for 3 month post implant.   [facility] [address] [place], [place] [address] [phone] 09/12/2020 11:11 AM

## 2020-09-13 ENCOUNTER — Telehealth: Payer: Self-pay | Admitting: Nurse Practitioner

## 2020-09-13 ENCOUNTER — Telehealth: Payer: Self-pay | Admitting: Cardiovascular Disease

## 2020-09-13 ENCOUNTER — Telehealth (HOSPITAL_COMMUNITY): Payer: Self-pay

## 2020-09-13 NOTE — Telephone Encounter (Signed)
09/13/20 - Contacted surgeon's office - spoke to [facility] - advised of patient's Positive Covid19 Lab Results. MBM

## 2020-09-13 NOTE — Telephone Encounter (Signed)
Patient tested positive for COVID

## 2020-09-13 NOTE — Telephone Encounter (Signed)
Mr. Magnan contacted this morning and advised that TEE has been rescheduled from 6/23 to 7/5 due to positive COVID result. Patient currently free of any symptoms and is afebrile at this time. I advised to reach out to his PCP if symptoms occur within the next 24-48 hours. Patient expressed full understanding of plan and encouraged to call me with any questions leading up to TEE date.   Ambrose Pancoast, RN Watchman Team

## 2020-09-13 NOTE — Telephone Encounter (Signed)
Pt contacted by nurse navigator and TEE rescheduled for 09/26/20.

## 2020-09-13 NOTE — Telephone Encounter (Signed)
Covid testing sight for [facility] called to let Dr. Johnsie Cancel know patient tested positive for COVID. Called patient to let him know he tested positive and find out if he has had any symptoms. Patient stated he has had no symptoms and feels fine. Patient has TEE schedule tomorrow with Dr. Johnsie Cancel. Informed patient that he would most likely have to have TEE rescheduled.  Patient also wanted to know if he needed antibiotics before dental work and who would be prescribing medication. Patient had Left Atrial Appendage Occlusion procedure on 08/03/20 with Dr. Quentin Ore. Will see if order for antibiotics is needed. Will send message to Dr. Johnsie Cancel for further advisement.

## 2020-09-14 DIAGNOSIS — U071 COVID-19: Secondary | ICD-10-CM | POA: Diagnosis not present

## 2020-09-14 DIAGNOSIS — I10 Essential (primary) hypertension: Secondary | ICD-10-CM | POA: Diagnosis not present

## 2020-09-14 DIAGNOSIS — E1169 Type 2 diabetes mellitus with other specified complication: Secondary | ICD-10-CM | POA: Diagnosis not present

## 2020-09-14 DIAGNOSIS — Z7984 Long term (current) use of oral hypoglycemic drugs: Secondary | ICD-10-CM | POA: Diagnosis not present

## 2020-09-22 ENCOUNTER — Other Ambulatory Visit: Payer: Self-pay | Admitting: Cardiology

## 2020-09-22 NOTE — Telephone Encounter (Signed)
Prescription refill request for Eliquis received.  Indication: afib  Last office visit: [place]-6/21/2022Quentin Ore, 06/29/2020 Scr: 1.29, 09/12/2020 Age: 75 yo  Weight: 99.4 kg   Pt is on the correct dose of Eliquis per dosing criteria, prescription refill sent for Eliquis 5mg  BID.

## 2020-09-26 ENCOUNTER — Encounter (HOSPITAL_COMMUNITY)
Admission: RE | Disposition: A | Discharge status: Home / Self Care | Payer: Self-pay | Source: Home / Self Care | Attending: Cardiovascular Disease

## 2020-09-26 ENCOUNTER — Ambulatory Visit (HOSPITAL_BASED_OUTPATIENT_CLINIC_OR_DEPARTMENT_OTHER)
Admission: RE | Admit: 2020-09-26 | Discharge: 2020-09-26 | Disposition: A | Discharge status: Home / Self Care | Payer: Medicare PPO | Source: Ambulatory Visit | Attending: Cardiovascular Disease | Admitting: Cardiovascular Disease

## 2020-09-26 ENCOUNTER — Telehealth: Payer: Self-pay | Admitting: Cardiology

## 2020-09-26 ENCOUNTER — Ambulatory Visit (HOSPITAL_COMMUNITY): Payer: Medicare PPO | Admitting: Anesthesiology

## 2020-09-26 ENCOUNTER — Other Ambulatory Visit: Payer: Self-pay

## 2020-09-26 ENCOUNTER — Ambulatory Visit (HOSPITAL_COMMUNITY)
Admission: RE | Admit: 2020-09-26 | Discharge: 2020-09-26 | Disposition: A | Discharge status: Home / Self Care | Payer: Medicare PPO | Attending: Cardiovascular Disease | Admitting: Cardiovascular Disease

## 2020-09-26 ENCOUNTER — Encounter (HOSPITAL_COMMUNITY): Payer: Self-pay | Admitting: Cardiovascular Disease

## 2020-09-26 DIAGNOSIS — E119 Type 2 diabetes mellitus without complications: Secondary | ICD-10-CM | POA: Insufficient documentation

## 2020-09-26 DIAGNOSIS — I2510 Atherosclerotic heart disease of native coronary artery without angina pectoris: Secondary | ICD-10-CM | POA: Insufficient documentation

## 2020-09-26 DIAGNOSIS — E785 Hyperlipidemia, unspecified: Secondary | ICD-10-CM | POA: Insufficient documentation

## 2020-09-26 DIAGNOSIS — I10 Essential (primary) hypertension: Secondary | ICD-10-CM | POA: Diagnosis not present

## 2020-09-26 DIAGNOSIS — Z7984 Long term (current) use of oral hypoglycemic drugs: Secondary | ICD-10-CM | POA: Diagnosis not present

## 2020-09-26 DIAGNOSIS — E7800 Pure hypercholesterolemia, unspecified: Secondary | ICD-10-CM | POA: Diagnosis not present

## 2020-09-26 DIAGNOSIS — I081 Rheumatic disorders of both mitral and tricuspid valves: Secondary | ICD-10-CM | POA: Diagnosis not present

## 2020-09-26 DIAGNOSIS — Z7982 Long term (current) use of aspirin: Secondary | ICD-10-CM | POA: Diagnosis not present

## 2020-09-26 DIAGNOSIS — I4821 Permanent atrial fibrillation: Secondary | ICD-10-CM | POA: Diagnosis not present

## 2020-09-26 DIAGNOSIS — D6869 Other thrombophilia: Secondary | ICD-10-CM | POA: Insufficient documentation

## 2020-09-26 DIAGNOSIS — I4811 Longstanding persistent atrial fibrillation: Secondary | ICD-10-CM | POA: Insufficient documentation

## 2020-09-26 DIAGNOSIS — Z7901 Long term (current) use of anticoagulants: Secondary | ICD-10-CM | POA: Diagnosis not present

## 2020-09-26 DIAGNOSIS — I4891 Unspecified atrial fibrillation: Secondary | ICD-10-CM | POA: Diagnosis not present

## 2020-09-26 DIAGNOSIS — I340 Nonrheumatic mitral (valve) insufficiency: Secondary | ICD-10-CM

## 2020-09-26 DIAGNOSIS — Z79899 Other long term (current) drug therapy: Secondary | ICD-10-CM | POA: Diagnosis not present

## 2020-09-26 HISTORY — PX: TEE WITHOUT CARDIOVERSION: SHX5443

## 2020-09-26 LAB — GLUCOSE, CAPILLARY: Glucose-Capillary: 109 mg/dL — ABNORMAL HIGH (ref 70–99)

## 2020-09-26 LAB — ECHO TEE: Radius: 0.4 cm

## 2020-09-26 SURGERY — ECHOCARDIOGRAM, TRANSESOPHAGEAL
Anesthesia: Monitor Anesthesia Care

## 2020-09-26 MED ORDER — LIDOCAINE 2% (20 MG/ML) 5 ML SYRINGE
INTRAMUSCULAR | Status: DC | PRN
  Administered 2020-09-26: 100 mg via INTRAVENOUS

## 2020-09-26 MED ORDER — PROPOFOL 10 MG/ML IV BOLUS
INTRAVENOUS | Status: DC | PRN
  Administered 2020-09-26: 70 ug via INTRAVENOUS

## 2020-09-26 MED ORDER — PROPOFOL 500 MG/50ML IV EMUL
INTRAVENOUS | Status: DC | PRN
  Administered 2020-09-26: 80 ug/kg/min via INTRAVENOUS

## 2020-09-26 MED ORDER — BUTAMBEN-TETRACAINE-BENZOCAINE 2-2-14 % EX AERO
INHALATION_SPRAY | CUTANEOUS | Status: DC | PRN
  Administered 2020-09-26: 2 via TOPICAL

## 2020-09-26 MED ORDER — SODIUM CHLORIDE 0.9 % IV SOLN: INTRAVENOUS | Status: DC

## 2020-09-26 NOTE — Transfer of Care (Signed)
Immediate Anesthesia Transfer of Care Note  Patient: Norman [name]  Procedure(s) Performed: TRANSESOPHAGEAL ECHOCARDIOGRAM (TEE)  Patient Location: PACU  Anesthesia Type:MAC  Level of Consciousness: awake, alert  and oriented  Airway & Oxygen Therapy: Patient Spontanous Breathing and Patient connected to nasal cannula oxygen  Post-op Assessment: Report given to RN and Post -op Vital signs reviewed and stable  Post vital signs: Reviewed and stable  Last Vitals:  Vitals Value Taken Time  BP    Temp    Pulse    Resp    SpO2      Last Pain:  Vitals:   09/26/20 0800  TempSrc: Oral  PainSc: 0-No pain         Complications: No notable events documented.

## 2020-09-26 NOTE — Discharge Instructions (Signed)

## 2020-09-26 NOTE — Anesthesia Postprocedure Evaluation (Signed)
Anesthesia Post Note  Patient: Norman [name]  Procedure(s) Performed: TRANSESOPHAGEAL ECHOCARDIOGRAM (TEE)     Patient location during evaluation: Endoscopy Anesthesia Type: MAC Level of consciousness: awake and alert Pain management: pain level controlled Vital Signs Assessment: post-procedure vital signs reviewed and stable Respiratory status: spontaneous breathing, nonlabored ventilation and respiratory function stable Cardiovascular status: blood pressure returned to baseline and stable Postop Assessment: no apparent nausea or vomiting Anesthetic complications: no   No notable events documented.  Last Vitals:  Vitals:   09/26/20 0945 09/26/20 0955  BP: 99/66 (!) 109/49  Pulse: (!) 46 (!) 45  Resp: (!) 23 (!) 25  Temp:    SpO2: 97% 98%    Last Pain:  Vitals:   09/26/20 0955  TempSrc:   PainSc: 0-No pain                 Lidia Collum

## 2020-09-26 NOTE — CV Procedure (Signed)
    TRANSESOPHAGEAL ECHOCARDIOGRAM   NAME:  Norman [name]    MRN: [id number] DOB:  [birth year]    ADMIT DATE: 09/26/2020  INDICATIONS: S/p Watchman  PROCEDURE:   Informed consent was obtained prior to the procedure. The risks, benefits and alternatives for the procedure were discussed and the patient comprehended these risks.  Risks include, but are not limited to, cough, sore throat, vomiting, nausea, somnolence, esophageal and stomach trauma or perforation, bleeding, low blood pressure, aspiration, pneumonia, infection, trauma to the teeth and death.    Procedural time out performed. The oropharynx was anesthetized with topical 1% benzocaine.    Anesthesia was administered by Dr. Kerin Perna.  The patient was administered 230 mg of propofol and 100 mg of lidocaine to achieve and maintain moderate conscious sedation.  The patient's heart rate, blood pressure, and oxygen saturation are monitored continuously during the procedure. The period of conscious sedation is 19 minutes, of which I was present face-to-face 100% of this time.   The transesophageal probe was inserted in the esophagus and stomach without difficulty and multiple views were obtained.   COMPLICATIONS:    There were no immediate complications.  KEY FINDINGS:  31 mm Watchman FLX without thrombus or leak. Well-seated. No complications. Normal LV/RV function.  Full report to follow. Further management per primary team.   [place] Bells T. Audie Box, MD, [place]  [address], [place], [place] [address] [phone]  9:23 AM

## 2020-09-26 NOTE — Telephone Encounter (Signed)
Patient states after his watchman he was told he needed an antibiotic for teeth cleaning. He states he has a dentist appointment for a cleaning next Thursday. He states he would like the antibiotic called in to [facility] on [address].

## 2020-09-26 NOTE — Progress Notes (Signed)
  Echocardiogram Echocardiogram Transesophageal has been performed.  Norman [name] 09/26/2020, 9:49 AM

## 2020-09-26 NOTE — Interval H&P Note (Signed)
History and Physical Interval Note:  09/26/2020 8:01 AM  Norman [name]  has presented today for surgery, with the diagnosis of POST WATCHMAN IMPLANT.  The various methods of treatment have been discussed with the patient and family. After consideration of risks, benefits and other options for treatment, the patient has consented to  Procedure(s): TRANSESOPHAGEAL ECHOCARDIOGRAM (TEE) (N/A) as a surgical intervention.  The patient's history has been reviewed, patient examined, no change in status, stable for surgery.  I have reviewed the patient's chart and labs.  Questions were answered to the patient's satisfaction.    NPO for TEE for Watchman device. On eliquis.   [place] Bells T. Audie Box, MD, [place]  [address] [place], [place] [address] [phone]  8:01 AM

## 2020-09-26 NOTE — Anesthesia Preprocedure Evaluation (Addendum)
Anesthesia Evaluation  Patient identified by MRN, date of birth, ID band Patient awake    Reviewed: Allergy & Precautions, NPO status , Patient's Chart, lab work & pertinent test results, reviewed documented beta blocker date and time   History of Anesthesia Complications Negative for: history of anesthetic complications  Airway Mallampati: II  TM Distance: >3 FB Neck ROM: Full    Dental no notable dental hx.    Pulmonary neg pulmonary ROS,    Pulmonary exam normal        Cardiovascular hypertension, Pt. on home beta blockers and Pt. on medications + CAD  Normal cardiovascular exam+ dysrhythmias (s/p Watchman) Atrial Fibrillation   TTE 08/03/20: EF 60-65%, mild LVH, severe LAE,  Tiny secundum atrial septal defect with exclusively left to right shunting across the atrial septum   Neuro/Psych Anxiety TIA (2009)   GI/Hepatic negative GI ROS, Neg liver ROS,   Endo/Other  diabetes, Type 2, Oral Hypoglycemic Agents  Renal/GU negative Renal ROS  negative genitourinary   Musculoskeletal negative musculoskeletal ROS (+)   Abdominal   Peds  Hematology negative hematology ROS (+)   Anesthesia Other Findings Day of surgery medications reviewed with patient.  Reproductive/Obstetrics negative OB ROS                            Anesthesia Physical Anesthesia Plan  ASA: 3  Anesthesia Plan: MAC   Post-op Pain Management:    Induction:   PONV Risk Score and Plan: Treatment may vary due to age or medical condition and Propofol infusion  Airway Management Planned: Natural Airway and Nasal Cannula  Additional Equipment:   Intra-op Plan:   Post-operative Plan:   Informed Consent: I have reviewed the patients History and Physical, chart, labs and discussed the procedure including the risks, benefits and alternatives for the proposed anesthesia with the patient or authorized representative who has  indicated his/her understanding and acceptance.       Plan Discussed with: CRNA  Anesthesia Plan Comments:        Anesthesia Quick Evaluation

## 2020-09-26 NOTE — Progress Notes (Deleted)
*  PRELIMINARY RESULTS* Echocardiogram Echocardiogram Transesophageal has been performed.  Fidel Levy 09/26/2020, 9:48 AM

## 2020-09-27 NOTE — Telephone Encounter (Signed)
Left message for patient that he will be called later this week to discuss medication changes once Dr. Quentin Ore reviews TEE and dental SBE.

## 2020-09-28 MED ORDER — AMOXICILLIN 500 MG PO TABS: ORAL_TABLET | ORAL | 0 refills | Status: DC

## 2020-09-28 MED ORDER — CLOPIDOGREL BISULFATE 75 MG PO TABS: 75.0000 mg | ORAL_TABLET | Freq: Every day | ORAL | 1 refills | Status: DC

## 2020-09-28 NOTE — Telephone Encounter (Signed)
Follow up:   Patient returning a call back.  

## 2020-09-28 NOTE — Telephone Encounter (Signed)
Left message to call back  

## 2020-09-28 NOTE — Telephone Encounter (Signed)
Per Dr. Quentin Ore, instructed patient to STOP ELIQUIS and START PLAVIX 75 mg daily. Reviewed SBE with patient. He understands to take amoxicillin 2000 mg 1 hour prior to dental visits until 6 months post Watchman.  Since he goes to the dentist every 3 months, will send in 2 doses. The patient was grateful for call and agrees with plan.

## 2020-09-28 NOTE — Telephone Encounter (Signed)
----- Message from Vickie Epley, MD sent at 09/28/2020  7:48 AM EDT ----- Regarding: RE: TEE Report for [facility] Please discontinue eliquis. Please start Plavix 75mg  by mouth once daily.   Please given him a prescription for amoxicillin 2grams by mouth x 1 dose to be given 30-60 minutes prior to the dental work.  Thanks!  Lysbeth Galas T. Quentin Ore, MD, [facility], [facility] Cardiac Electrophysiology      ----- Message ----- From: Tempie Donning, RN Sent: 09/27/2020   6:43 AM EDT To: Vickie Epley, MD Subject: TEE Report for [place] ECHO REPORT       Patient Name:  Norman [name] Date of Exam: 09/26/2020  Medical Rec #: [id number]       Height:    72.0 in  Accession #:  [id number]      Weight:    219.2 lb  Date of Birth: Oct 18, 1945       BSA:     2.215 m  Patient Age:  75 years       BP:      103/50 mmHg  Patient Gender: M           HR:      69 bpm.  Exam Location: Inpatient   Procedure: Transesophageal Echo, 3D Echo, Color Doppler and Cardiac  Doppler   Indications:   Post Watchman    History:     Patient has prior history of Echocardiogram examinations,  most          recent 08/03/2020. Stroke and TIA, Arrythmias:Atrial          Fibrillation; Risk Factors:Hypertension and Diabetes.    Sonographer:   Bernadene Person RDCS  Referring Phys: [id number] [place]  Diagnosing Phys: Eleonore Chiquito MD   PROCEDURE: After discussion of the risks and benefits of a TEE, an  informed consent was obtained from the patient. TEE procedure time was 19  minutes. The transesophogeal probe was passed without difficulty through  the esophogus of the patient. Imaged  were obtained with the patient in a left lateral decubitus position. Local  oropharyngeal anesthetic was provided with Cetacaine. Sedation performed  by different physician. The patient was  monitored while under deep  sedation. Anesthestetic sedation was  provided intravenously by Anesthesiology: 230mg  of Propofol, 100mg  of  Lidocaine. Image quality was excellent. The patient's vital signs;  including heart rate, blood pressure, and oxygen saturation; remained  stable throughout the procedure. The patient  developed no complications during the procedure.   IMPRESSIONS    1. 31 mm Watchman FLX (08/03/2020). Well positioned device with shoulder  noted in the 3 o'clock position (directly above AMVL). Max 2D compression  25.6 mm (~18% compression). No device related thrombus. No device leak  detected. Normal Watchman FLX  device. Left atrial size was moderately dilated. No left atrial/left  atrial appendage thrombus was detected.  2. Left ventricular ejection fraction, by estimation, is 60 to 65%. The  left ventricle has normal function. The left ventricle has no regional  wall motion abnormalities.  3. Right ventricular systolic function is low normal. The right  ventricular size is mildly enlarged.  4. Right atrial size was mild to moderately dilated.  5. Mild MR. PISA radius 0.4 cm, ERO 0.09 cm2, R vol 15 cc. The mitral  valve is normal in structure. Mild mitral valve regurgitation.  No evidence  of mitral stenosis.  6. The aortic valve is tricuspid. Aortic valve regurgitation is not  visualized. No aortic stenosis is present.  7. There is mild (Grade II) layered plaque involving the transverse aorta  and descending aorta.   FINDINGS  Left Ventricle: Left ventricular ejection fraction, by estimation, is 60  to 65%. The left ventricle has normal function. The left ventricle has no  regional wall motion abnormalities. The left ventricular internal cavity  size was normal in size.   Right Ventricle: The right ventricular size is mildly enlarged. No  increase in right ventricular wall thickness. Right ventricular systolic  function is low normal.   Left Atrium:  31 mm Watchman FLX (08/03/2020). Well positioned device with  shoulder noted in the 3 o'clock position (directly above AMVL). Max 2D  compression 25.6 mm (~18% compression). No device related thrombus. No  device leak detected. Normal Watchman  FLX device. Left atrial size was moderately dilated. No left atrial/left  atrial appendage thrombus was detected.   Right Atrium: Right atrial size was mild to moderately dilated.   Pericardium: Trivial pericardial effusion is present.   Mitral Valve: Mild MR. PISA radius 0.4 cm, ERO 0.09 cm2, R vol 15 cc. The  mitral valve is normal in structure. Mild mitral valve regurgitation. No  evidence of mitral valve stenosis.   Tricuspid Valve: The tricuspid valve is normal in structure. Tricuspid  valve regurgitation is trivial. No evidence of tricuspid stenosis.   Aortic Valve: The aortic valve is tricuspid. Aortic valve regurgitation is  not visualized. No aortic stenosis is present.   Pulmonic Valve: The pulmonic valve was normal in structure. Pulmonic valve  regurgitation is trivial. No evidence of pulmonic stenosis.   Aorta: The aortic root and ascending aorta are structurally normal, with  no evidence of dilitation. There is mild (Grade II) layered plaque  involving the transverse aorta and descending aorta.   IAS/Shunts: No atrial level shunt detected by color flow Doppler.       AORTA  Ao Root diam: 3.76 cm  Ao Asc diam: 3.42 cm   MR PISA:    1.01 cm TRICUSPID VALVE  MR PISA Radius: 0.40 cm TR Peak grad:  13.0 mmHg              TR Vmax:    180.00 cm/s   Eleonore Chiquito MD  Electronically signed by Eleonore Chiquito MD  Signature Date/Time: 09/26/2020/10:15:02 AM      Final

## 2020-10-17 DIAGNOSIS — H401131 Primary open-angle glaucoma, bilateral, mild stage: Secondary | ICD-10-CM | POA: Diagnosis not present

## 2020-11-07 ENCOUNTER — Other Ambulatory Visit: Payer: Self-pay

## 2020-11-07 ENCOUNTER — Ambulatory Visit: Payer: Medicare PPO | Admitting: Cardiology

## 2020-11-07 ENCOUNTER — Encounter: Payer: Self-pay | Admitting: Cardiology

## 2020-11-07 VITALS — BP 136/84 | HR 60 | Ht 72.0 in | Wt 226.2 lb

## 2020-11-07 DIAGNOSIS — I4821 Permanent atrial fibrillation: Secondary | ICD-10-CM

## 2020-11-07 DIAGNOSIS — R319 Hematuria, unspecified: Secondary | ICD-10-CM | POA: Diagnosis not present

## 2020-11-07 DIAGNOSIS — I10 Essential (primary) hypertension: Secondary | ICD-10-CM

## 2020-11-07 DIAGNOSIS — I639 Cerebral infarction, unspecified: Secondary | ICD-10-CM | POA: Diagnosis not present

## 2020-11-07 NOTE — Patient Instructions (Addendum)
Medication Instructions:  Your physician recommends that you continue on your current medications as directed. Please refer to the Current Medication list given to you today.  Labwork: None ordered.  Testing/Procedures: None ordered.  Follow-Up: Your physician wants you to follow-up in: 05/15/20 at 11:45 am with Lars Mage, MD    Any Other Special Instructions Will Be Listed Below (If Applicable).  If you need a refill on your cardiac medications before your next appointment, please call your pharmacy.

## 2020-11-07 NOTE — Progress Notes (Signed)
Electrophysiology Office Follow up Visit Note:    Date:  11/07/2020   ID:  Norman [name], DOB [birth year], MRN [id number]  PCP:  Antony Contras, MD  [place] Cardiologist:  Fransico Him, MD  [facility] Electrophysiologist:  Vickie Epley, MD    Interval History:    Norman [name] is a 75 y.o. male who presents for a follow up visit. He underwent a successful watchman implant on 08/03/2020. He had a follow up TEE on 09/26/2020 which showed 18% compression, no thrombus or leak and a well positioned device. He is currently taking aspirin '81mg'$  PO daily and plavix '75mg'$  PO daily. He tells me today that he is very pleased with the outcome of his watchman procedure.  He is very happy that he is off his anticoagulant.    Past Medical History:  Diagnosis Date   Anxiety    Blood in urine 2 1/2 WEEKS AGO   BPH (benign prostatic hypertrophy)    Mild   Coronary artery calcification seen on CAT scan 2011   no ischemia on nuclear stress test 2011   Dilated aortic root (HCC)    85m by echo 2017   Dysrhythmia    afib   Epididymal cyst 2005   Bilateral and small bilateral hydroceles with most recent ultra sound   Glaucoma    History of kidney stones    Hypercholesteremia    Hypertension    Kidney stones    "I've passed them all"   Migraine    "got off caffeine; they went away"   Permanent [place] fibrillation [place]    Stroke ([facility]    TIA ? - 2009    Tendinitis    History in Rotator cuff   TIA (transient ischemic attack) ?12/28/2013   Type II diabetes mellitus ([place]     Past Surgical History:  Procedure Laterality Date   CARDIOVERSION  X 1   CYSTOSCOPY WITH LITHOLAPAXY N/A 12/05/2017   Procedure: CYSTOSCOPY WITH LITHOLAPAXY;  Surgeon: OKathie Rhodes MD;  Location: [facility];  Service: Urology;  Laterality: N/A;   CYSTOSCOPY WITH RETROGRADE PYELOGRAM, URETEROSCOPY AND STENT PLACEMENT Right 08/18/2015   Procedure: CYSTOSCOPY WITH RIGHT  RETROGRADE PYELOGRAM, RIGHT  URETEROGRAM.;  Surgeon: MKathie Rhodes MD;  Location: [facility];  Service: Urology;  Laterality: Right;   LEFT ATRIAL APPENDAGE OCCLUSION N/A 08/03/2020   Procedure: LEFT ATRIAL APPENDAGE OCCLUSION;  Surgeon: LVickie Epley MD;  [facility];  Service: Cardiovascular;  Laterality: N/A;   TEE WITHOUT CARDIOVERSION N/A 08/03/2020   Procedure: TRANSESOPHAGEAL ECHOCARDIOGRAM (TEE);  Surgeon: LVickie Epley MD;  [facility];  Service: Cardiovascular;  Laterality: N/A;   TEE WITHOUT CARDIOVERSION N/A 09/26/2020   Procedure: TRANSESOPHAGEAL ECHOCARDIOGRAM (TEE);  Surgeon: OGeralynn Rile MD;  [place]  Service: Cardiovascular;  Laterality: N/A;   TONSILLECTOMY      Current Medications: Current Meds  Medication Sig   acetaminophen (TYLENOL) 500 MG tablet Take 1,000 mg by mouth every 6 (six) hours as needed for moderate pain or headache.   amoxicillin (AMOXIL) 500 MG tablet Take four tablets (2,000 mg total) by mouth 1 hour prior to dental visits.   aspirin EC 81 MG tablet Take 1 tablet (81 mg total) by mouth daily.   atorvastatin (LIPITOR) 80 MG tablet Take 80 mg by mouth every evening.    chlorthalidone (HYGROTON) 25 MG tablet Take 12.5 mg by mouth daily.    clopidogrel (PLAVIX) 75 MG  tablet Take 1 tablet (75 mg total) by mouth daily.   COMBIGAN 0.2-0.5 % ophthalmic solution Place 1 drop into both eyes 2 (two) times daily.   diltiazem (TIAZAC) 240 MG 24 hr capsule Take 240 mg by mouth daily.   ezetimibe (ZETIA) 10 MG tablet TAKE ONE TABLET BY MOUTH ONE TIME DAILY   LUMIGAN 0.01 % SOLN Place 1 drop into both eyes at bedtime.   metFORMIN (GLUCOPHAGE) 500 MG tablet Take 500 mg by mouth daily with breakfast.   metoprolol tartrate (LOPRESSOR) 25 MG tablet Take 25 mg by mouth 2 (two) times daily.   OVER THE COUNTER MEDICATION Apply 1 application topically 3 (three) times a week. O'Keeffe's Healthy Feet Cream   valsartan (DIOVAN) 320 MG tablet Take  320 mg by mouth daily.   zolpidem (AMBIEN CR) 12.5 MG CR tablet Take 12.5 mg by mouth at bedtime.     Allergies:   Patient has no known allergies.   Social History   Socioeconomic History   Marital status: Married    Spouse name: Not on file   Number of children: 2   Years of education: college   Highest education level: Not on file  Occupational History   Not on file  Tobacco Use   Smoking status: Never   Smokeless tobacco: Never  Vaping Use   Vaping Use: Never used  Substance and Sexual Activity   Alcohol use: Yes    Alcohol/week: 1.0 standard drink    Types: 1 Glasses of wine per week    Comment: occ   Drug use: No   Sexual activity: Not on file  Other Topics Concern   Not on file  Social History Narrative   Patient is married with 2 children.   Patient is right handed.   Patient has college education.   Patient drinks very little caffeine.   Social Determinants of Health   Financial Resource Strain: Not on file  Food Insecurity: Not on file  Transportation Needs: Not on file  Physical Activity: Not on file  Stress: Not on file  Social Connections: Not on file     Family History: The patient's family history includes Diabetes in his maternal aunt; Heart attack in his father; Kidney failure in his mother.  ROS:   Please see the history of present illness.    All other systems reviewed and are negative.  EKGs/Labs/Other Studies Reviewed:    The following studies were reviewed today:  September 26, 2020 transesophageal echo personally reviewed 1. 31 mm Watchman FLX (08/03/2020). Well positioned device with shoulder  noted in the 3 o'clock position (directly above AMVL). Max 2D compression  25.6 mm (~18% compression). No device related thrombus. No device leak  detected. Normal Watchman FLX  device. Left atrial size was moderately dilated. No left atrial/left  atrial appendage thrombus was detected.   2. Left ventricular ejection fraction, by estimation, is 60  to 65%. The  left ventricle has normal function. The left ventricle has no regional  wall motion abnormalities.   3. Right ventricular systolic function is low normal. The right  ventricular size is mildly enlarged.   4. Right atrial size was mild to moderately dilated.   5. Mild MR. PISA radius 0.4 cm, ERO 0.09 cm2, R vol 15 cc. The mitral  valve is normal in structure. Mild mitral valve regurgitation. No evidence  of mitral stenosis.   6. The aortic valve is tricuspid. Aortic valve regurgitation is not  visualized. No aortic stenosis  is present.   7. There is mild (Grade II) layered plaque involving the transverse aorta  and descending aorta.   EKG:  The ekg ordered today demonstrates atrial fibrillation  Recent Labs: 09/12/2020: BUN 17; Creatinine, Ser 1.29; Hemoglobin 17.4; Platelets 274; Potassium 4.4; Sodium 133  Recent Lipid Panel    Component Value Date/Time   CHOL 104 01/06/2019 0801   TRIG 83 01/06/2019 0801   HDL 32 (L) 01/06/2019 0801   CHOLHDL 3.3 01/06/2019 0801   CHOLHDL 3.6 12/29/2013 0400   VLDL 22 12/29/2013 0400   LDLCALC 55 01/06/2019 0801    Physical Exam:    VS:  BP 136/84   Pulse 60   Ht 6' (1.829 m)   Wt 226 lb 3.2 oz (102.6 kg)   SpO2 93%   BMI 30.68 kg/m     Wt Readings from Last 3 Encounters:  11/07/20 226 lb 3.2 oz (102.6 kg)  09/26/20 219 lb 3.2 oz (99.4 kg)  09/12/20 219 lb 3.2 oz (99.4 kg)     GEN:  Well nourished, well developed in no acute distress HEENT: Normal NECK: No JVD; No carotid bruits LYMPHATICS: No lymphadenopathy CARDIAC: Irregularly irregular, no murmurs, rubs, gallops RESPIRATORY:  Clear to auscultation without rales, wheezing or rhonchi  ABDOMEN: Soft, non-tender, non-distended MUSCULOSKELETAL:  No edema; No deformity  SKIN: Warm and dry NEUROLOGIC:  Alert and oriented x 3 PSYCHIATRIC:  Normal affect   ASSESSMENT:    1. Permanent atrial fibrillation ([place])   2. Primary hypertension   3. Hematuria, unspecified  type   4. Cerebral infarction, unspecified mechanism ([place])    PLAN:    In order of problems listed above:   1. Permanent atrial fibrillation [facility]) Doing well after his watchman implant on Aug 03, 2020.  He has been transition to aspirin and Plavix.  He will continue this until the 66-monthmark after his procedure.  At that point, can discontinue the Plavix and continue on aspirin monotherapy.  2. Primary hypertension Controlled.  Continue current medical regimen  3. Hematuria, unspecified type No recurrent issues since stopping his blood thinner.  Watchman as above.  4. Cerebral infarction, unspecified mechanism ([place] See #1     Medication Adjustments/Labs and Tests Ordered: Current medicines are reviewed at length with the patient today.  Concerns regarding medicines are outlined above.  Orders Placed This Encounter  Procedures   EKG 12-Lead    No orders of the defined types were placed in this encounter.    Signed, CLars Mage MD, [facility]8/16/2022 1:56 PM    Electrophysiology [facility]

## 2020-12-20 ENCOUNTER — Other Ambulatory Visit: Payer: Self-pay | Admitting: Cardiology

## 2021-02-02 ENCOUNTER — Other Ambulatory Visit: Payer: Self-pay | Admitting: Cardiology

## 2021-02-02 ENCOUNTER — Telehealth: Payer: Self-pay | Admitting: Cardiology

## 2021-02-02 NOTE — Telephone Encounter (Signed)
  [facility]    Patient contacted for 6 month follow up post watchman implant performed 08/03/20. He is doing well with no concerns. He was instructed to stop his Plavix and continue lifelong ASA 81mg  PO QD. He was also instructed that he will no longer need SBE prophylaxis for dental cleanings and procedures. He will follow with Dr. Quentin Ore 04/2021 for 1 year follow up.   Kathyrn Drown NP-C Structural Heart Team  Pager: [phone]

## 2021-02-26 DIAGNOSIS — Z1389 Encounter for screening for other disorder: Secondary | ICD-10-CM | POA: Diagnosis not present

## 2021-02-26 DIAGNOSIS — Z0000 Encounter for general adult medical examination without abnormal findings: Secondary | ICD-10-CM | POA: Diagnosis not present

## 2021-02-26 DIAGNOSIS — E1169 Type 2 diabetes mellitus with other specified complication: Secondary | ICD-10-CM | POA: Diagnosis not present

## 2021-02-26 DIAGNOSIS — G4700 Insomnia, unspecified: Secondary | ICD-10-CM | POA: Diagnosis not present

## 2021-02-26 DIAGNOSIS — M19079 Primary osteoarthritis, unspecified ankle and foot: Secondary | ICD-10-CM | POA: Diagnosis not present

## 2021-02-26 DIAGNOSIS — I10 Essential (primary) hypertension: Secondary | ICD-10-CM | POA: Diagnosis not present

## 2021-02-26 DIAGNOSIS — E782 Mixed hyperlipidemia: Secondary | ICD-10-CM | POA: Diagnosis not present

## 2021-02-26 DIAGNOSIS — R195 Other fecal abnormalities: Secondary | ICD-10-CM | POA: Diagnosis not present

## 2021-02-26 DIAGNOSIS — I4891 Unspecified atrial fibrillation: Secondary | ICD-10-CM | POA: Diagnosis not present

## 2021-03-12 ENCOUNTER — Other Ambulatory Visit: Payer: Self-pay | Admitting: Cardiology

## 2021-04-02 DIAGNOSIS — N401 Enlarged prostate with lower urinary tract symptoms: Secondary | ICD-10-CM | POA: Diagnosis not present

## 2021-04-02 DIAGNOSIS — R310 Gross hematuria: Secondary | ICD-10-CM | POA: Diagnosis not present

## 2021-04-02 DIAGNOSIS — R350 Frequency of micturition: Secondary | ICD-10-CM | POA: Diagnosis not present

## 2021-04-02 DIAGNOSIS — Q541 Hypospadias, penile: Secondary | ICD-10-CM | POA: Diagnosis not present

## 2021-04-25 DIAGNOSIS — H401131 Primary open-angle glaucoma, bilateral, mild stage: Secondary | ICD-10-CM | POA: Diagnosis not present

## 2021-04-25 DIAGNOSIS — E119 Type 2 diabetes mellitus without complications: Secondary | ICD-10-CM | POA: Diagnosis not present

## 2021-04-25 DIAGNOSIS — H52203 Unspecified astigmatism, bilateral: Secondary | ICD-10-CM | POA: Diagnosis not present

## 2021-04-25 DIAGNOSIS — Z961 Presence of intraocular lens: Secondary | ICD-10-CM | POA: Diagnosis not present

## 2021-05-04 DIAGNOSIS — E1139 Type 2 diabetes mellitus with other diabetic ophthalmic complication: Secondary | ICD-10-CM | POA: Diagnosis not present

## 2021-05-04 DIAGNOSIS — E119 Type 2 diabetes mellitus without complications: Secondary | ICD-10-CM | POA: Diagnosis not present

## 2021-05-04 DIAGNOSIS — E782 Mixed hyperlipidemia: Secondary | ICD-10-CM | POA: Diagnosis not present

## 2021-05-04 DIAGNOSIS — I10 Essential (primary) hypertension: Secondary | ICD-10-CM | POA: Diagnosis not present

## 2021-05-13 NOTE — [place] Notes (Unsigned)
Cardiology Office Note Date:  05/13/2021  Patient ID:  Norman, [name] Jan 23, 1946, MRN [id number] PCP:  Antony Contras, MD  Cardiologist:  Dr. Radford Pax Electrophysiologist: Dr. Quentin Ore  ***refresh   Chief Complaint: *** annual  History of Present Illness: Norman [name] is a 76 y.o. male with history of HTN, HLD, TIA/stroke, AFib, DM  He was referred to Dr. Quentin Ore April 2022 for consideration of watchman device with hematuria/bleeding . He underwent procedure 08/03/20. He last saw Dr. Quentin Ore follow up TEE on 09/26/2020 which showed 18% compression, no thrombus or leak and a well positioned device. He is currently taking aspirin 81mg  PO daily and plavix 75mg  PO daily, planned to continue DAPT until the 82mo mark then ASA alone  Nov 2022, Plavix was stopped, instructed no longer needed SBE prophylaxis and planned for visit with Dr. Quentin Ore Feb 2023.   *** symptoms *** bleeding? Hematuria on ASA alone? *** rate  Past Medical History:  Diagnosis Date   Anxiety    Blood in urine 2 1/2 WEEKS AGO   BPH (benign prostatic hypertrophy)    Mild   Coronary artery calcification seen on CAT scan 2011   no ischemia on nuclear stress test 2011   Dilated aortic root (HCC)    67mm by echo 2017   Dysrhythmia    afib   Epididymal cyst 2005   Bilateral and small bilateral hydroceles with most recent ultra sound   Glaucoma    History of kidney stones    Hypercholesteremia    Hypertension    Kidney stones    "I've passed them all"   Migraine    "got off caffeine; they went away"   Permanent atrial fibrillation ([place])    Stroke [facility])    TIA ? - 2009    Tendinitis    History in Rotator cuff   TIA (transient ischemic attack) ?12/28/2013   Type II diabetes mellitus ([place])     Past Surgical History:  Procedure Laterality Date   CARDIOVERSION  X 1   CYSTOSCOPY WITH LITHOLAPAXY N/A 12/05/2017   Procedure: CYSTOSCOPY WITH LITHOLAPAXY;  Surgeon: Kathie Rhodes, MD;  Location: [facility];  Service: Urology;  Laterality: N/A;   CYSTOSCOPY WITH RETROGRADE PYELOGRAM, URETEROSCOPY AND STENT PLACEMENT Right 08/18/2015   Procedure: CYSTOSCOPY WITH RIGHT  RETROGRADE PYELOGRAM, RIGHT URETEROGRAM.;  Surgeon: Kathie Rhodes, MD;  Location: [facility];  Service: Urology;  Laterality: Right;   LEFT ATRIAL APPENDAGE OCCLUSION N/A 08/03/2020   Procedure: LEFT ATRIAL APPENDAGE OCCLUSION;  Surgeon: Vickie Epley, MD;  Location: [place] CV LAB;  Service: Cardiovascular;  Laterality: N/A;   TEE WITHOUT CARDIOVERSION N/A 08/03/2020   Procedure: TRANSESOPHAGEAL ECHOCARDIOGRAM (TEE);  Surgeon: Vickie Epley, MD;  Location: [place] CV LAB;  Service: Cardiovascular;  Laterality: N/A;   TEE WITHOUT CARDIOVERSION N/A 09/26/2020   Procedure: TRANSESOPHAGEAL ECHOCARDIOGRAM (TEE);  Surgeon: Geralynn Rile, MD;  Location: [place];  Service: Cardiovascular;  Laterality: N/A;   TONSILLECTOMY      Current Outpatient Medications  Medication Sig Dispense Refill   acetaminophen (TYLENOL) 500 MG tablet Take 1,000 mg by mouth every 6 (six) hours as needed for moderate pain or headache.     aspirin EC 81 MG tablet Take 1 tablet (81 mg total) by mouth daily. 150 tablet 2   atorvastatin (LIPITOR) 80 MG tablet Take 80 mg by mouth every evening.      chlorthalidone (HYGROTON) 25 MG tablet Take 12.5 mg by mouth  daily.      COMBIGAN 0.2-0.5 % ophthalmic solution Place 1 drop into both eyes 2 (two) times daily.     diltiazem (TIAZAC) 240 MG 24 hr capsule Take 240 mg by mouth daily.     ezetimibe (ZETIA) 10 MG tablet TAKE ONE TABLET BY MOUTH ONE TIME DAILY 90 tablet 3   LUMIGAN 0.01 % SOLN Place 1 drop into both eyes at bedtime.     metFORMIN (GLUCOPHAGE) 500 MG tablet Take 500 mg by mouth daily with breakfast.     metoprolol tartrate (LOPRESSOR) 25 MG tablet Take 25 mg by mouth 2 (two) times daily.     OVER THE COUNTER MEDICATION Apply 1 application topically 3 (three) times a week. O'Keeffe's  Healthy Feet Cream     valsartan (DIOVAN) 320 MG tablet Take 320 mg by mouth daily.     zolpidem (AMBIEN CR) 12.5 MG CR tablet Take 12.5 mg by mouth at bedtime.     No current facility-administered medications for this visit.    Allergies:   Patient has no known allergies.   Social History:  The patient  reports that he has never smoked. He has never used smokeless tobacco. He reports current alcohol use of about 1.0 standard drink per week. He reports that he does not use drugs.   Family History:  The patient's family history includes Diabetes in his maternal aunt; Heart attack in his father; Kidney failure in his mother.  ROS:  Please see the history of present illness.    All other systems are reviewed and otherwise negative.   PHYSICAL EXAM:  VS:  There were no vitals taken for this visit. BMI: There is no height or weight on file to calculate BMI. Well nourished, well developed, in no acute distress HEENT: normocephalic, atraumatic Neck: no JVD, carotid bruits or masses Cardiac:  *** RRR; no significant murmurs, no rubs, or gallops Lungs:  *** CTA b/l, no wheezing, rhonchi or rales Abd: soft, nontender MS: no deformity or *** atrophy Ext: *** no edema Skin: warm and dry, no rash Neuro:  No gross deficits appreciated Psych: euthymic mood, full affect    EKG:  not done today  09/26/20: TEE IMPRESSIONS   1. 31 mm Watchman FLX (08/03/2020). Well positioned device with shoulder  noted in the 3 o'clock position (directly above AMVL). Max 2D compression  25.6 mm (~18% compression). No device related thrombus. No device leak  detected. Normal Watchman FLX  device. Left atrial size was moderately dilated. No left atrial/left  atrial appendage thrombus was detected.   2. Left ventricular ejection fraction, by estimation, is 60 to 65%. The  left ventricle has normal function. The left ventricle has no regional  wall motion abnormalities.   3. Right ventricular systolic function is  low normal. The right  ventricular size is mildly enlarged.   4. Right atrial size was mild to moderately dilated.   5. Mild MR. PISA radius 0.4 cm, ERO 0.09 cm2, R vol 15 cc. The mitral  valve is normal in structure. Mild mitral valve regurgitation. No evidence  of mitral stenosis.   6. The aortic valve is tricuspid. Aortic valve regurgitation is not  visualized. No aortic stenosis is present.   7. There is mild (Grade II) layered plaque involving the transverse aorta  and descending aorta.    08/03/20: Watchman implant CONCLUSIONS:  1.Successful implantation of a WATCHMAN left atrial appendage occlusive device    2. TEE demonstrating no LAA thrombus 3. No  early apparent complications.   Recent Labs: 09/12/2020: BUN 17; Creatinine, Ser 1.29; Hemoglobin 17.4; Platelets 274; Potassium 4.4; Sodium 133  No results found for requested labs within last 8760 hours.   CrCl cannot be calculated (Patient's most recent lab result is older than the maximum 21 days allowed.).   Wt Readings from Last 3 Encounters:  11/07/20 226 lb 3.2 oz (102.6 kg)  09/26/20 219 lb 3.2 oz (99.4 kg)  09/12/20 219 lb 3.2 oz (99.4 kg)     Other studies reviewed: Additional studies/records reviewed today include: summarized above  ASSESSMENT AND PLAN:  Permanent Afib CHA2DS2Vasc is 6, off a/c post watchman *** on ASA alone at this juncture *** rate  Disposition: F/u with ***  Current medicines are reviewed at length with the patient today.  The patient did not have any concerns regarding medicines.  Venetia Night, PA-C 05/13/2021 3:55 PM     [address] [place] [address] [phone] (office)  [phone] (fax)

## 2021-05-15 ENCOUNTER — Ambulatory Visit: Payer: Medicare PPO | Admitting: Cardiology

## 2021-05-17 ENCOUNTER — Ambulatory Visit: Payer: Medicare PPO | Admitting: Physician Assistant

## 2021-05-21 ENCOUNTER — Other Ambulatory Visit: Payer: Self-pay

## 2021-05-21 ENCOUNTER — Ambulatory Visit: Payer: Medicare PPO | Admitting: Cardiology

## 2021-05-21 ENCOUNTER — Encounter: Payer: Self-pay | Admitting: Cardiology

## 2021-05-21 VITALS — BP 124/70 | HR 62 | Ht 71.0 in | Wt 230.0 lb

## 2021-05-21 DIAGNOSIS — Z95818 Presence of other cardiac implants and grafts: Secondary | ICD-10-CM | POA: Diagnosis not present

## 2021-05-21 DIAGNOSIS — I4821 Permanent atrial fibrillation: Secondary | ICD-10-CM | POA: Diagnosis not present

## 2021-05-21 DIAGNOSIS — I10 Essential (primary) hypertension: Secondary | ICD-10-CM | POA: Diagnosis not present

## 2021-05-21 NOTE — Patient Instructions (Addendum)
Medication Instructions:  Your physician recommends that you continue on your current medications as directed. Please refer to the Current Medication list given to you today. *If you need a refill on your cardiac medications before your next appointment, please call your pharmacy*  Lab Work: None. If you have labs (blood work) drawn today and your tests are completely normal, you will receive your results only by: MyChart Message (if you have MyChart) OR A paper copy in the mail If you have any lab test that is abnormal or we need to change your treatment, we will call you to review the results.  Testing/Procedures: None.  Follow-Up: At [facility], you and your health needs are our priority.  As part of our continuing mission to provide you with exceptional heart care, we have created designated Provider Care Teams.  These Care Teams include your primary Cardiologist (physician) and Advanced Practice Providers (APPs -  Physician Assistants and Nurse Practitioners) who all work together to provide you with the care you need, when you need it.  Your physician wants you to follow-up in: 6 months with one of the following Advanced Practice Providers on your designated Care Team:    Renee Ursuy, PA-C Michael "Andy" Tillery, PA-C   You will receive a reminder letter in the mail two months in advance. If you don't receive a letter, please call our office to schedule the follow-up appointment.  We recommend signing up for the patient portal called "MyChart".  Sign up information is provided on this After Visit Summary.  MyChart is used to connect with patients for Virtual Visits (Telemedicine).  Patients are able to view lab/test results, encounter notes, upcoming appointments, etc.  Non-urgent messages can be sent to your provider as well.   To learn more about what you can do with MyChart, go to [email or web address].    Any Other Special Instructions Will Be Listed Below (If  Applicable).         

## 2021-05-21 NOTE — [place] Notes (Signed)
Electrophysiology Office Follow up Visit Note:    Date:  05/21/2021   ID:  Norman [name], DOB 1945-07-18, MRN [id number]  PCP:  Antony Contras, MD  [place] Cardiologist:  Fransico Him, MD  [facility] Electrophysiologist:  Vickie Epley, MD    Interval History:    Norman [name] is a 76 y.o. male who presents for a follow up visit. He underwent a successful watchman implant on 08/03/2020. He had a follow up TEE on 09/26/2020 which showed 18% compression, no thrombus or leak and a well positioned device. They were last seen in clinic 11/07/2020.   Since their last appointment, he was contacted on 02/02/2021 for 6 month follow-up post watchman implant. He was doing well. He was instructed to stop his Plavix and continue Aspirin 81 mg.  Overall, he has been feeling fine. He denies any bleeding issues while on Aspirin.  He states he is fairly active. He continues to work on increasing his walking exercise. Lately he is limited by issues with his right ankle, and is hoping to be able to continue with his reconstruction procedure soon.  He denies any palpitations, chest pain, shortness of breath, or peripheral edema. No lightheadedness, headaches, syncope, orthopnea, or PND.      Past Medical History:  Diagnosis Date   Anxiety    Blood in urine 2 1/2 WEEKS AGO   BPH (benign prostatic hypertrophy)    Mild   Coronary artery calcification seen on CAT scan 2011   no ischemia on nuclear stress test 2011   Dilated aortic root (HCC)    33mm by echo 2017   Dysrhythmia    afib   Epididymal cyst 2005   Bilateral and small bilateral hydroceles with most recent ultra sound   Glaucoma    History of kidney stones    Hypercholesteremia    Hypertension    Kidney stones    "I've passed them all"   Migraine    "got off caffeine; they went away"   Permanent atrial fibrillation ([place])    Stroke [facility])    TIA ? - 2009    Tendinitis    History in Rotator cuff   TIA  (transient ischemic attack) ?12/28/2013   Type II diabetes mellitus ([place])     Past Surgical History:  Procedure Laterality Date   CARDIOVERSION  X 1   CYSTOSCOPY WITH LITHOLAPAXY N/A 12/05/2017   Procedure: CYSTOSCOPY WITH LITHOLAPAXY;  Surgeon: Kathie Rhodes, MD;  [facility];  Service: Urology;  Laterality: N/A;   CYSTOSCOPY WITH RETROGRADE PYELOGRAM, URETEROSCOPY AND STENT PLACEMENT Right 08/18/2015   Procedure: CYSTOSCOPY WITH RIGHT  RETROGRADE PYELOGRAM, RIGHT URETEROGRAM.;  Surgeon: Kathie Rhodes, MD;  [facility];  Service: Urology;  Laterality: Right;   LEFT ATRIAL APPENDAGE OCCLUSION N/A 08/03/2020   Procedure: LEFT ATRIAL APPENDAGE OCCLUSION;  Surgeon: Vickie Epley, MD;  Location: [place] CV LAB;  Service: Cardiovascular;  Laterality: N/A;   TEE WITHOUT CARDIOVERSION N/A 08/03/2020   Procedure: TRANSESOPHAGEAL ECHOCARDIOGRAM (TEE);  Surgeon: Vickie Epley, MD;  Location: [place] CV LAB;  Service: Cardiovascular;  Laterality: N/A;   TEE WITHOUT CARDIOVERSION N/A 09/26/2020   Procedure: TRANSESOPHAGEAL ECHOCARDIOGRAM (TEE);  Surgeon: Geralynn Rile, MD;  Location: [place];  Service: Cardiovascular;  Laterality: N/A;   TONSILLECTOMY      Current Medications: Current Meds  Medication Sig   aspirin EC 81 MG tablet Take 1 tablet (81 mg total) by mouth daily.  atorvastatin (LIPITOR) 80 MG tablet Take 80 mg by mouth every evening.    chlorthalidone (HYGROTON) 25 MG tablet Take 12.5 mg by mouth daily.    COMBIGAN 0.2-0.5 % ophthalmic solution Place 1 drop into both eyes 2 (two) times daily.   diltiazem (TIAZAC) 240 MG 24 hr capsule Take 240 mg by mouth daily.   ezetimibe (ZETIA) 10 MG tablet TAKE ONE TABLET BY MOUTH ONE TIME DAILY   LUMIGAN 0.01 % SOLN Place 1 drop into both eyes at bedtime.   metFORMIN (GLUCOPHAGE) 500 MG tablet Take 500 mg by mouth daily with breakfast.   metoprolol tartrate (LOPRESSOR) 25 MG tablet Take 25 mg by mouth 2 (two) times  daily.   valsartan (DIOVAN) 320 MG tablet Take 320 mg by mouth daily.   zolpidem (AMBIEN CR) 12.5 MG CR tablet Take 12.5 mg by mouth at bedtime.     Allergies:   Patient has no known allergies.   Social History   Socioeconomic History   Marital status: Married    Spouse name: Not on file   Number of children: 2   Years of education: college   Highest education level: Not on file  Occupational History   Not on file  Tobacco Use   Smoking status: Never   Smokeless tobacco: Never  Vaping Use   Vaping Use: Never used  Substance and Sexual Activity   Alcohol use: Yes    Alcohol/week: 1.0 standard drink    Types: 1 Glasses of wine per week    Comment: occ   Drug use: No   Sexual activity: Not on file  Other Topics Concern   Not on file  Social History Narrative   Patient is married with 2 children.   Patient is right handed.   Patient has college education.   Patient drinks very little caffeine.   Social Determinants of Health   Financial Resource Strain: Not on file  Food Insecurity: Not on file  Transportation Needs: Not on file  Physical Activity: Not on file  Stress: Not on file  Social Connections: Not on file     Family History: The patient's family history includes Diabetes in his maternal aunt; Heart attack in his father; Kidney failure in his mother.  ROS:   Please see the history of present illness.    (+) Right ankle pain All other systems reviewed and are negative.  EKGs/Labs/Other Studies Reviewed:    The following studies were reviewed today:  September 26, 2020 transesophageal echo personally reviewed 1. 31 mm Watchman FLX (08/03/2020). Well positioned device with shoulder  noted in the 3 o'clock position (directly above AMVL). Max 2D compression  25.6 mm (~18% compression). No device related thrombus. No device leak  detected. Normal Watchman FLX  device. Left atrial size was moderately dilated. No left atrial/left  atrial appendage thrombus was  detected.   2. Left ventricular ejection fraction, by estimation, is 60 to 65%. The  left ventricle has normal function. The left ventricle has no regional  wall motion abnormalities.   3. Right ventricular systolic function is low normal. The right  ventricular size is mildly enlarged.   4. Right atrial size was mild to moderately dilated.   5. Mild MR. PISA radius 0.4 cm, ERO 0.09 cm2, R vol 15 cc. The mitral  valve is normal in structure. Mild mitral valve regurgitation. No evidence  of mitral stenosis.   6. The aortic valve is tricuspid. Aortic valve regurgitation is not  visualized. No  aortic stenosis is present.   7. There is mild (Grade II) layered plaque involving the transverse aorta  and descending aorta.   Cardiac CTA 07/17/2020: FINDINGS: Image quality: excellent.   Noise artifact is: Mild cardiac motion artifact.   Left Atrium: The left atrial size is dilated. There is no PFO/ASD. There is normal pulmonary vein drainage into the left atrium (2 on the right and 2 on the left).   Left Atrial Appendage:   Morphology: The left atrial appendage is large chicken wing type with two lobes that is directed anteriorly. Thrombus: There is mixing artifact in the LAA on contrast imaging, but there is no thrombus on delayed imaging.   The following measurements were made regarding left atrial appendage closure:   Phase assessed: 25%   Landing Zone measurement: 25.4 mm x 21.4 mm   LAA Length (maximum): 21.1 mm   Optimal interatrial septum puncture site: A mid and mid IAS puncture is recommended.   Optimal deployment angle: RAO 1 CRA 1   Catheter: An anterior curve catheter is recommended.   Watchman FLX Device: A 31 mm device is recommended with 18% compression.   Other comments: None.   Coronary Arteries: CAC score of 976, which is 75th percentile for age- and sex-matched controls. Normal coronary origin. Right dominance.   Right Atrium: Right atrial size is  dilated.   Right Ventricle: The right ventricular cavity is within normal limits.   Left Ventricle: The ventricular cavity size is within normal limits. There are no stigmata of prior infarction. There is no abnormal filling defect.   Pulmonary Artery: Normal caliber without proximal filling defect.   Cardiac valves: The aortic valve is trileaflet without significant calcification. The mitral valve is normal structure with mild posterior mitral annular calcification.   Aorta: Normal caliber with no significant disease.   Pericardium: Normal thickness with no significant effusion or calcium present.   Extra-cardiac findings: See attached radiology report for non-cardiac structures.   IMPRESSION: 1. The left atrial appendage is large chicken wing type with two lobes that is directed anteriorly.   2. A 31 mm watchman FLX device is recommended based on the above landing zone measurements (25.4 mm maximum diameter; 18% compression).   3. There is no thrombus in the left atrial appendage on delayed imaging.   4. A mid and mid IAS puncture site is recommended. An anterior curve catheter is recommended.   5. Optimal deployment angle: RAO 1 CRA 1   6. Normal coronary origin. Right dominance. CAC score of 976, which is 75th percentile for age- and sex-matched controls.  EKG:  EKG is personally reviewed.  05/21/2021: Atrial fibrillation with a slow ventricular response in the 40s  Recent Labs: 09/12/2020: BUN 17; Creatinine, Ser 1.29; Hemoglobin 17.4; Platelets 274; Potassium 4.4; Sodium 133   Recent Lipid Panel    Component Value Date/Time   CHOL 104 01/06/2019 0801   TRIG 83 01/06/2019 0801   HDL 32 (L) 01/06/2019 0801   CHOLHDL 3.3 01/06/2019 0801   CHOLHDL 3.6 12/29/2013 0400   VLDL 22 12/29/2013 0400   LDLCALC 55 01/06/2019 0801    Physical Exam:    VS:  BP 124/70    Pulse 62    Ht 5\' 11"  (1.803 m)    Wt 230 lb (104.3 kg)    SpO2 97%    BMI 32.08 kg/m     Wt  Readings from Last 3 Encounters:  05/21/21 230 lb (104.3 kg)  11/07/20 226 lb  3.2 oz (102.6 kg)  09/26/20 219 lb 3.2 oz (99.4 kg)     GEN: Well nourished, well developed in no acute distress.  Obese HEENT: Normal NECK: No JVD; No carotid bruits LYMPHATICS: No lymphadenopathy CARDIAC: Irregularly irregular, bradycardic, no murmurs, rubs, gallops RESPIRATORY:  Clear to auscultation without rales, wheezing or rhonchi  ABDOMEN: Soft, non-tender, non-distended MUSCULOSKELETAL:  No edema; No deformity  SKIN: Warm and dry NEUROLOGIC:  Alert and oriented x 3 PSYCHIATRIC:  Normal affect        ASSESSMENT:    No diagnosis found. PLAN:    In order of problems listed above:  #Permanent atrial fibrillation #Watchman in situ Device in appropriate position.  On aspirin 81 mg by mouth monotherapy.  Has upcoming urologic procedures scheduled and orthopedic procedure.  Aspirin can be safely stopped prior to these procedures if felt to be necessary and restarted when felt safe from a surgical perspective.  His rates are controlled when in atrial fibrillation and even on the slower side.  If his rates were to become lower or he were to develop symptoms thought to be associated with the slow ventricular response, could decrease the dose of diltiazem or metoprolol.  Follow-up in 6 months with one of the APP's.   Medication Adjustments/Labs and Tests Ordered: Current medicines are reviewed at length with the patient today.  Concerns regarding medicines are outlined above.  No orders of the defined types were placed in this encounter.  No orders of the defined types were placed in this encounter.   I,Mathew Stumpf,acting as a Education administrator for Vickie Epley, MD.,have documented all relevant documentation on the behalf of Vickie Epley, MD,as directed by  Vickie Epley, MD while in the presence of Vickie Epley, MD.  I, Vickie Epley, MD, have reviewed all documentation for this  visit. The documentation on 05/21/21 for the exam, diagnosis, procedures, and orders are all accurate and complete.   Signed, Lars Mage, MD, [facility], [facility] 05/21/2021 11:08 PM    Electrophysiology  [facility]

## 2021-06-05 DIAGNOSIS — L570 Actinic keratosis: Secondary | ICD-10-CM | POA: Diagnosis not present

## 2021-06-05 DIAGNOSIS — D485 Neoplasm of uncertain behavior of skin: Secondary | ICD-10-CM | POA: Diagnosis not present

## 2021-06-05 DIAGNOSIS — X32XXXD Exposure to sunlight, subsequent encounter: Secondary | ICD-10-CM | POA: Diagnosis not present

## 2021-06-05 DIAGNOSIS — D225 Melanocytic nevi of trunk: Secondary | ICD-10-CM | POA: Diagnosis not present

## 2021-06-05 DIAGNOSIS — D2262 Melanocytic nevi of left upper limb, including shoulder: Secondary | ICD-10-CM | POA: Diagnosis not present

## 2021-07-16 DIAGNOSIS — I4891 Unspecified atrial fibrillation: Secondary | ICD-10-CM | POA: Diagnosis not present

## 2021-07-16 DIAGNOSIS — N35919 Unspecified urethral stricture, male, unspecified site: Secondary | ICD-10-CM | POA: Diagnosis not present

## 2021-07-16 DIAGNOSIS — N400 Enlarged prostate without lower urinary tract symptoms: Secondary | ICD-10-CM | POA: Diagnosis not present

## 2021-07-16 DIAGNOSIS — N200 Calculus of kidney: Secondary | ICD-10-CM | POA: Diagnosis not present

## 2021-07-16 DIAGNOSIS — Q549 Hypospadias, unspecified: Secondary | ICD-10-CM | POA: Diagnosis not present

## 2021-08-30 DIAGNOSIS — E782 Mixed hyperlipidemia: Secondary | ICD-10-CM | POA: Diagnosis not present

## 2021-08-30 DIAGNOSIS — Z8673 Personal history of transient ischemic attack (TIA), and cerebral infarction without residual deficits: Secondary | ICD-10-CM | POA: Diagnosis not present

## 2021-08-30 DIAGNOSIS — I10 Essential (primary) hypertension: Secondary | ICD-10-CM | POA: Diagnosis not present

## 2021-08-30 DIAGNOSIS — I4891 Unspecified atrial fibrillation: Secondary | ICD-10-CM | POA: Diagnosis not present

## 2021-08-30 DIAGNOSIS — G4700 Insomnia, unspecified: Secondary | ICD-10-CM | POA: Diagnosis not present

## 2021-08-30 DIAGNOSIS — E1169 Type 2 diabetes mellitus with other specified complication: Secondary | ICD-10-CM | POA: Diagnosis not present

## 2021-08-30 DIAGNOSIS — I69398 Other sequelae of cerebral infarction: Secondary | ICD-10-CM | POA: Diagnosis not present

## 2021-08-30 DIAGNOSIS — M19079 Primary osteoarthritis, unspecified ankle and foot: Secondary | ICD-10-CM | POA: Diagnosis not present

## 2021-08-31 DIAGNOSIS — E782 Mixed hyperlipidemia: Secondary | ICD-10-CM | POA: Diagnosis not present

## 2021-08-31 DIAGNOSIS — E1169 Type 2 diabetes mellitus with other specified complication: Secondary | ICD-10-CM | POA: Diagnosis not present

## 2021-09-07 ENCOUNTER — Telehealth: Payer: Self-pay

## 2021-09-07 NOTE — Telephone Encounter (Signed)
Called to check in with patient 1 year post-LAAO.  Left message to call back.

## 2021-09-12 NOTE — Telephone Encounter (Signed)
Left message to call back  

## 2021-09-14 NOTE — Telephone Encounter (Signed)
The patient reports he is doing well 1 year post-LAAO.  He is loving not taking his a/c and being able to live his life without risk of bleeding. He was grateful for follow-up.

## 2021-09-20 DIAGNOSIS — M898X7 Other specified disorders of bone, ankle and foot: Secondary | ICD-10-CM | POA: Diagnosis not present

## 2021-09-20 DIAGNOSIS — M85871 Other specified disorders of bone density and structure, right ankle and foot: Secondary | ICD-10-CM | POA: Diagnosis not present

## 2021-09-20 DIAGNOSIS — M19071 Primary osteoarthritis, right ankle and foot: Secondary | ICD-10-CM | POA: Diagnosis not present

## 2021-09-20 DIAGNOSIS — M25471 Effusion, right ankle: Secondary | ICD-10-CM | POA: Diagnosis not present

## 2021-09-20 DIAGNOSIS — M7731 Calcaneal spur, right foot: Secondary | ICD-10-CM | POA: Diagnosis not present

## 2021-09-20 DIAGNOSIS — M25571 Pain in right ankle and joints of right foot: Secondary | ICD-10-CM | POA: Diagnosis not present

## 2021-10-16 DIAGNOSIS — L578 Other skin changes due to chronic exposure to nonionizing radiation: Secondary | ICD-10-CM | POA: Diagnosis not present

## 2021-10-16 DIAGNOSIS — X32XXXD Exposure to sunlight, subsequent encounter: Secondary | ICD-10-CM | POA: Diagnosis not present

## 2021-10-16 DIAGNOSIS — D2261 Melanocytic nevi of right upper limb, including shoulder: Secondary | ICD-10-CM | POA: Diagnosis not present

## 2021-10-16 DIAGNOSIS — D225 Melanocytic nevi of trunk: Secondary | ICD-10-CM | POA: Diagnosis not present

## 2021-10-16 DIAGNOSIS — Z1283 Encounter for screening for malignant neoplasm of skin: Secondary | ICD-10-CM | POA: Diagnosis not present

## 2021-10-16 DIAGNOSIS — L570 Actinic keratosis: Secondary | ICD-10-CM | POA: Diagnosis not present

## 2021-10-16 DIAGNOSIS — D485 Neoplasm of uncertain behavior of skin: Secondary | ICD-10-CM | POA: Diagnosis not present

## 2021-10-31 DIAGNOSIS — H401131 Primary open-angle glaucoma, bilateral, mild stage: Secondary | ICD-10-CM | POA: Diagnosis not present

## 2021-11-27 ENCOUNTER — Other Ambulatory Visit: Payer: Self-pay | Admitting: Cardiology

## 2021-12-10 ENCOUNTER — Ambulatory Visit: Payer: Medicare PPO | Admitting: Allergy

## 2021-12-13 DIAGNOSIS — L230 Allergic contact dermatitis due to metals: Secondary | ICD-10-CM | POA: Diagnosis not present

## 2021-12-19 ENCOUNTER — Ambulatory Visit: Payer: Medicare PPO | Admitting: Allergy

## 2021-12-25 DIAGNOSIS — L230 Allergic contact dermatitis due to metals: Secondary | ICD-10-CM | POA: Diagnosis not present

## 2021-12-28 DIAGNOSIS — L309 Dermatitis, unspecified: Secondary | ICD-10-CM | POA: Diagnosis not present

## 2022-03-13 ENCOUNTER — Telehealth: Payer: Self-pay | Admitting: *Deleted

## 2022-03-13 DIAGNOSIS — M19079 Primary osteoarthritis, unspecified ankle and foot: Secondary | ICD-10-CM | POA: Diagnosis not present

## 2022-03-13 DIAGNOSIS — M25571 Pain in right ankle and joints of right foot: Secondary | ICD-10-CM | POA: Diagnosis not present

## 2022-03-13 NOTE — Telephone Encounter (Signed)
   Pre-operative Risk Assessment    Patient Name: Norman [name]  DOB: 27-Oct-1945 MRN: [id number]      Request for Surgical Clearance    Procedure:   RIGHT TOTAL ANKLE REPLACEMENT, POSSIBLE HEEL CORD LENGTHENING  Date of Surgery:  Clearance TBD                                 Surgeon:  DR. Jenny Reichmann HEWITT Surgeon's Group or Practice Name:  [facility] Phone number:  [phone] ATTN: [place] Fax number:  [phone]   Type of Clearance Requested:   - Medical ; NO MEDICATIONS LISTED AS NEEDING TO HELD   Type of Anesthesia:  General    Additional requests/questions:    Jiles Prows   03/13/2022, 5:22 PM

## 2022-03-14 NOTE — Telephone Encounter (Signed)
   Name: Norman [name]  DOB: 05/05/1945  MRN: [id number]  Primary Cardiologist: Fransico Him, MD   Preoperative team, please contact this patient and set up a phone call appointment for further preoperative risk assessment. Please obtain consent and complete medication review. Thank you for your help.  I confirm that guidance regarding antiplatelet and oral anticoagulation therapy has been completed and, if necessary, noted below.  No request for anticoagulation hold   Deberah Pelton, NP 03/14/2022, 11:57 AM [name]

## 2022-03-14 NOTE — Telephone Encounter (Signed)
Pt has an appointment to see Dr. Radford Pax, 04/15/22, in person, and requested to keep that appointment.  Will route back to the requesting surgeon's office to make them aware that clearance will be addressed at that time.

## 2022-03-29 DIAGNOSIS — M25571 Pain in right ankle and joints of right foot: Secondary | ICD-10-CM | POA: Diagnosis not present

## 2022-04-08 DIAGNOSIS — N401 Enlarged prostate with lower urinary tract symptoms: Secondary | ICD-10-CM | POA: Diagnosis not present

## 2022-04-08 DIAGNOSIS — R3912 Poor urinary stream: Secondary | ICD-10-CM | POA: Diagnosis not present

## 2022-04-08 DIAGNOSIS — R3915 Urgency of urination: Secondary | ICD-10-CM | POA: Diagnosis not present

## 2022-04-09 DIAGNOSIS — Z1331 Encounter for screening for depression: Secondary | ICD-10-CM | POA: Diagnosis not present

## 2022-04-09 DIAGNOSIS — E1169 Type 2 diabetes mellitus with other specified complication: Secondary | ICD-10-CM | POA: Diagnosis not present

## 2022-04-09 DIAGNOSIS — Z0000 Encounter for general adult medical examination without abnormal findings: Secondary | ICD-10-CM | POA: Diagnosis not present

## 2022-04-09 DIAGNOSIS — I10 Essential (primary) hypertension: Secondary | ICD-10-CM | POA: Diagnosis not present

## 2022-04-09 DIAGNOSIS — G4700 Insomnia, unspecified: Secondary | ICD-10-CM | POA: Diagnosis not present

## 2022-04-09 DIAGNOSIS — I4891 Unspecified atrial fibrillation: Secondary | ICD-10-CM | POA: Diagnosis not present

## 2022-04-09 DIAGNOSIS — Z23 Encounter for immunization: Secondary | ICD-10-CM | POA: Diagnosis not present

## 2022-04-09 DIAGNOSIS — E782 Mixed hyperlipidemia: Secondary | ICD-10-CM | POA: Diagnosis not present

## 2022-04-15 ENCOUNTER — Encounter: Payer: Self-pay | Admitting: Cardiology

## 2022-04-15 ENCOUNTER — Ambulatory Visit: Payer: Medicare PPO | Attending: Cardiology | Admitting: Cardiology

## 2022-04-15 VITALS — BP 123/71 | Ht 71.0 in | Wt 231.8 lb

## 2022-04-15 DIAGNOSIS — I2510 Atherosclerotic heart disease of native coronary artery without angina pectoris: Secondary | ICD-10-CM | POA: Diagnosis not present

## 2022-04-15 DIAGNOSIS — I4821 Permanent atrial fibrillation: Secondary | ICD-10-CM | POA: Diagnosis not present

## 2022-04-15 DIAGNOSIS — I10 Essential (primary) hypertension: Secondary | ICD-10-CM

## 2022-04-15 DIAGNOSIS — Z01818 Encounter for other preprocedural examination: Secondary | ICD-10-CM

## 2022-04-15 DIAGNOSIS — I77810 Thoracic aortic ectasia: Secondary | ICD-10-CM | POA: Diagnosis not present

## 2022-04-15 DIAGNOSIS — E7800 Pure hypercholesterolemia, unspecified: Secondary | ICD-10-CM

## 2022-04-15 NOTE — Progress Notes (Signed)
Date:  04/15/2022   ID:  Marko Stai [name], DOB 1945-07-24, MRN [id number]   PCP:  Antony Contras, MD  Cardiologist:  Fransico Him, MD  Electrophysiologist:  Vickie Epley, MD   Chief Complaint:  Afib, CAD, HTN, HLD  History of Present Illness:    Norman [name] is a 77 y.o. male with a hx of permanent atrial fibrillation, carotid artery stenosis, coronary artery calcifications by CT, hyperlipidemia and dilated aortic root and HTN.    He is here today for follow-up as well as cardiac clearance for surgery.  Since I saw him last he underwent successful watchman implant on 08/03/2020 now on aspirin 81 mg daily which has been held for upcoming orthopedic procedure.  He is here today  doing well.  He denies any chest pain or pressure, SOB, DOE, PND, orthopnea, LE edema, dizziness, palpitations or syncope. He is compliant with his meds and is tolerating meds with no SE.    Prior CV studies:   The following studies were reviewed today: None  Past Medical History:  Diagnosis Date   Anxiety    Blood in urine 2 1/2 WEEKS AGO   BPH (benign prostatic hypertrophy)    Mild   Coronary artery calcification seen on CAT scan 2011   no ischemia on nuclear stress test 2011   Dilated aortic root (HCC)    59m by echo 2017   Dysrhythmia    afib   Epididymal cyst 2005   Bilateral and small bilateral hydroceles with most recent ultra sound   Glaucoma    History of kidney stones    Hypercholesteremia    Hypertension    Kidney stones    "I've passed them all"   Migraine    "got off caffeine; they went away"   [place]    [facility]    TIA ? - 2009    Tendinitis    History in Rotator cuff   TIA (transient ischemic attack) ?12/28/2013   [place] II diabetes mellitus [place]    Past Surgical History:  Procedure Laterality Date   CARDIOVERSION  X 1   CYSTOSCOPY WITH LITHOLAPAXY N/A 12/05/2017   Procedure: CYSTOSCOPY WITH LITHOLAPAXY;  Surgeon: OKathie Rhodes MD;  Location: [facility];  Service: Urology;  Laterality: N/A;   CYSTOSCOPY WITH RETROGRADE PYELOGRAM, URETEROSCOPY AND STENT PLACEMENT Right 08/18/2015   Procedure: CYSTOSCOPY WITH RIGHT  RETROGRADE PYELOGRAM, RIGHT URETEROGRAM.;  Surgeon: MKathie Rhodes MD;  Location: [facility];  Service: Urology;  Laterality: Right;   LEFT ATRIAL APPENDAGE OCCLUSION N/A 08/03/2020   Procedure: LEFT ATRIAL APPENDAGE OCCLUSION;  Surgeon: LVickie Epley MD;  [place] [facility];  Service: Cardiovascular;  Laterality: N/A;   TEE WITHOUT CARDIOVERSION N/A 08/03/2020   Procedure: TRANSESOPHAGEAL ECHOCARDIOGRAM (TEE);  Surgeon: LVickie Epley MD;  [place] [facility];  Service: Cardiovascular;  Laterality: N/A;   TEE WITHOUT CARDIOVERSION N/A 09/26/2020   Procedure: TRANSESOPHAGEAL ECHOCARDIOGRAM (TEE);  Surgeon: OGeralynn Rile MD;  [facility];  Service: Cardiovascular;  Laterality: N/A;   TONSILLECTOMY       Current Meds  Medication Sig   aspirin EC 81 MG tablet Take 81 mg by mouth daily.   atorvastatin (LIPITOR) 80 MG tablet Take 80 mg by mouth every evening.    chlorthalidone (HYGROTON) 25 MG tablet Take 12.5 mg by mouth daily.    COMBIGAN 0.2-0.5 % ophthalmic solution Place 1 drop into both eyes 2 (two) times  daily.   diltiazem (TIAZAC) 240 MG 24 hr capsule Take 240 mg by mouth daily.   ezetimibe (ZETIA) 10 MG tablet TAKE ONE TABLET BY MOUTH ONE TIME DAILY   latanoprost (XALATAN) 0.005 % ophthalmic solution SMARTSIG:In Eye(s)   LUMIGAN 0.01 % SOLN Place 1 drop into both eyes at bedtime.   metFORMIN (GLUCOPHAGE) 500 MG tablet Take 500 mg by mouth daily with breakfast.   metoprolol tartrate (LOPRESSOR) 25 MG tablet Take 25 mg by mouth 2 (two) times daily.   tamsulosin (FLOMAX) 0.4 MG CAPS capsule Take 0.4 mg by mouth daily.   valsartan (DIOVAN) 320 MG tablet Take 320 mg by mouth daily.   zolpidem (AMBIEN CR) 12.5 MG CR tablet Take 12.5 mg by mouth at bedtime.      Allergies:   Patient has no known allergies.   Social History   Tobacco Use   Smoking status: Never   Smokeless tobacco: Never  Vaping Use   Vaping Use: Never used  Substance Use Topics   Alcohol use: Yes    Alcohol/week: 1.0 standard drink of alcohol    Types: 1 Glasses of wine per week    Comment: occ   Drug use: No     Family Hx: The patient's family history includes Diabetes in his maternal aunt; Heart attack in his father; Kidney failure in his mother.  ROS:   Please see the history of present illness.     All other systems reviewed and are negative.   Labs/Other Tests and Data Reviewed:    Recent Labs: No results found for requested labs within last 365 days.   Recent Lipid Panel Lab Results  Component Value Date/Time   CHOL 104 01/06/2019 08:01 AM   TRIG 83 01/06/2019 08:01 AM   HDL 32 (L) 01/06/2019 08:01 AM   CHOLHDL 3.3 01/06/2019 08:01 AM   CHOLHDL 3.6 12/29/2013 04:00 AM   LDLCALC 55 01/06/2019 08:01 AM    Wt Readings from Last 3 Encounters:  04/15/22 231 lb 12.8 oz (105.1 kg)  05/21/21 230 lb (104.3 kg)  11/07/20 226 lb 3.2 oz (102.6 kg)     Objective:    Vital Signs:  BP 123/71   Ht '5\' 11"'$  (1.803 m)   Wt 231 lb 12.8 oz (105.1 kg)   SpO2 97%   BMI 32.33 kg/m    GEN: Well nourished, well developed in no acute distress HEENT: Normal NECK: No JVD; No carotid bruits LYMPHATICS: No lymphadenopathy CARDIAC:RRR, no murmurs, rubs, gallops RESPIRATORY:  Clear to auscultation without rales, wheezing or rhonchi  ABDOMEN: Soft, non-tender, non-distended MUSCULOSKELETAL:  No edema; No deformity  SKIN: Warm and dry NEUROLOGIC:  Alert and oriented x 3 PSYCHIATRIC:  Normal affect   EKG was performed today and showed atrial fibrillation with SVR and nonspecific IVCD with repol abnormality  ASSESSMENT & PLAN:    1.  Permanent atrial fibrillation  -He denies any palpitations send heart rate is well-controlled -Status post Watchman device  and  no longer on DOAC -Continue prescription drug management with Lopressor 25 mg twice daily and Cardizem CD '240mg'$  daily with as needed refills  2.  Hypertension  -BP is adequately controlled on exam -Continue prescription management with Lopressor 25 mg twice daily, Cardizem CD '240mg'$  daily, chlorthalidone 12.'5mg'$  daily, Diovan 320 mg daily with as needed refills -I have personally reviewed and interpreted outside labs performed by patient's PCP which showed Serum creatinine 1.16 and potassium 4.7 on 04/09/2022  3.  Dilated ascending aorta  -  this was normal on last echo 2017  4.  Coronary artery calcifications  -this was noted on prior Chest CT.   -2D echo 10/2018 with normal LVF  -Lexiscan myoview with no ischemia 10/2018 -He has not had any anginal symptoms -Continue statin therapy, BB  and ASA '81mg'$  daily  5.  Hyperlipidemia  - his LDL goal is < 70 due to coronary artery calcification since -I have personally reviewed and interpreted outside labs performed by patient's PCP which showed LDL 55 and HDL 40 on 04/09/2022 and normal ALT at 27 -Continue prescription drug therapy with atorvastatin 80 mg daily and Zetia 10 mg daily with as needed refills  6.  Preoperative cardiovascular examination The patient's perioperative risk of a major cardiac event is 6.6% according to the Revised Cardiac Risk Index (RCRI).  Therefore, the patent is at moderate risk for perioperative complications mainly due to hx of TIA and DM. The patient's functional capacity is mildly reduced due to ankle pain at 5.5 METs according to the Duke Activity Status Index (DASI). No further cardiac testing needed at this time.  Medication Adjustments/Labs and Tests Ordered: Current medicines are reviewed at length with the patient today.  Concerns regarding medicines are outlined above.  Tests Ordered: Orders Placed This Encounter  Procedures   EKG 12-Lead   Medication Changes: No orders of the defined types were placed in  this encounter.   Disposition:  Follow up 1 year  Signed, Fransico Him, MD  04/15/2022 2:36 PM    [facility]

## 2022-04-15 NOTE — Patient Instructions (Signed)
Medication Instructions:  Your physician recommends that you continue on your current medications as directed. Please refer to the Current Medication list given to you today.  *If you need a refill on your cardiac medications before your next appointment, please call your pharmacy*   Lab Work: None.  If you have labs (blood work) drawn today and your tests are completely normal, you will receive your results only by: MyChart Message (if you have MyChart) OR A paper copy in the mail If you have any lab test that is abnormal or we need to change your treatment, we will call you to review the results.   Testing/Procedures: None.   Follow-Up:   Your next appointment:   1 year(s)  Provider:   Traci Turner, MD     

## 2022-04-26 ENCOUNTER — Other Ambulatory Visit (HOSPITAL_COMMUNITY): Payer: Self-pay | Admitting: Orthopedic Surgery

## 2022-05-02 DIAGNOSIS — M19071 Primary osteoarthritis, right ankle and foot: Secondary | ICD-10-CM | POA: Diagnosis not present

## 2022-05-16 ENCOUNTER — Encounter (HOSPITAL_BASED_OUTPATIENT_CLINIC_OR_DEPARTMENT_OTHER): Payer: Self-pay | Admitting: Orthopedic Surgery

## 2022-05-16 NOTE — Progress Notes (Signed)
Reviewed cardiac clearance with Dr. Lanetta Inch at [facility]. Okay to proceed with surgery as scheduled

## 2022-05-17 ENCOUNTER — Encounter (HOSPITAL_BASED_OUTPATIENT_CLINIC_OR_DEPARTMENT_OTHER)
Admission: RE | Admit: 2022-05-17 | Discharge: 2022-05-17 | Disposition: A | Discharge status: Home / Self Care | Payer: Medicare PPO | Source: Ambulatory Visit | Attending: Orthopedic Surgery | Admitting: Orthopedic Surgery

## 2022-05-17 DIAGNOSIS — Z01812 Encounter for preprocedural laboratory examination: Secondary | ICD-10-CM | POA: Diagnosis not present

## 2022-05-17 LAB — BASIC METABOLIC PANEL
Anion gap: 3 — ABNORMAL LOW (ref 5–15)
BUN: 13 mg/dL (ref 8–23)
CO2: 29 mmol/L (ref 22–32)
Calcium: 9.6 mg/dL (ref 8.9–10.3)
Chloride: 96 mmol/L — ABNORMAL LOW (ref 98–111)
Creatinine, Ser: 1.2 mg/dL (ref 0.61–1.24)
GFR, Estimated: >60 mL/min (ref >60)
Glucose, Bld: 66 mg/dL — ABNORMAL LOW (ref 70–99)
Potassium: 4.1 mmol/L (ref 3.5–5.1)
Sodium: 128 mmol/L — ABNORMAL LOW (ref 135–145)

## 2022-05-17 LAB — SURGICAL PCR SCREEN
MRSA, PCR: NEGATIVE
Staphylococcus aureus: NEGATIVE

## 2022-05-17 NOTE — Progress Notes (Signed)
       Patient Instructions  The night before surgery:  No food after midnight. ONLY clear liquids after midnight  The day of surgery (if you do NOT have diabetes):  Drink ONE (1) Pre-Surgery Clear Ensure as directed.   This drink was given to you during your hospital  pre-op appointment visit. The pre-op nurse will instruct you on the time to drink the  Pre-Surgery Ensure depending on your surgery time. Finish the drink at the designated time by the pre-op nurse.  Nothing else to drink after completing the  Pre-Surgery Clear Ensure.  The day of surgery (if you have diabetes): Drink ONE (1) Gatorade 2 (G2) as directed. This drink was given to you during your hospital  pre-op appointment visit.  The pre-op nurse will instruct you on the time to drink the   Gatorade 2 (G2) depending on your surgery time. Color of the Gatorade may vary. Red is not allowed. Nothing else to drink after completing the  Gatorade 2 (G2).         If you have questions, please contact your surgeon's office.Gave patient CHG soap with instructions, patient verbalized understanding.

## 2022-05-20 DIAGNOSIS — N401 Enlarged prostate with lower urinary tract symptoms: Secondary | ICD-10-CM | POA: Diagnosis not present

## 2022-05-20 DIAGNOSIS — R3912 Poor urinary stream: Secondary | ICD-10-CM | POA: Diagnosis not present

## 2022-05-20 DIAGNOSIS — R350 Frequency of micturition: Secondary | ICD-10-CM | POA: Diagnosis not present

## 2022-05-22 NOTE — Anesthesia Preprocedure Evaluation (Signed)
Anesthesia Evaluation  Patient identified by MRN, date of birth, ID band Patient awake    Reviewed: Allergy & Precautions, NPO status , Patient's Chart, lab work & pertinent test results, reviewed documented beta blocker date and time   History of Anesthesia Complications Negative for: history of anesthetic complications  Airway Mallampati: II  TM Distance: >3 FB Neck ROM: Full    Dental  (+) Dental Advisory Given, Teeth Intact   Pulmonary neg pulmonary ROS   Pulmonary exam normal        Cardiovascular hypertension, Pt. on medications and Pt. on home beta blockers + CAD  + dysrhythmias Atrial Fibrillation  Rhythm:Irregular Rate:Bradycardia     Neuro/Psych  Headaches PSYCHIATRIC DISORDERS Anxiety     TIA   GI/Hepatic negative GI ROS, Neg liver ROS,,,  Endo/Other  diabetes, Type 2, Oral Hypoglycemic Agents    Renal/GU negative Renal ROS     Musculoskeletal negative musculoskeletal ROS (+)    Abdominal   Peds  Hematology negative hematology ROS (+)   Anesthesia Other Findings   Reproductive/Obstetrics                             Anesthesia Physical Anesthesia Plan  ASA: 3  Anesthesia Plan: General   Post-op Pain Management: Tylenol PO (pre-op)* and Regional block*   Induction: Intravenous  PONV Risk Score and Plan: 2 and Treatment may vary due to age or medical condition and Ondansetron  Airway Management Planned: LMA  Additional Equipment: None  Intra-op Plan:   Post-operative Plan: Extubation in OR  Informed Consent: I have reviewed the patients History and Physical, chart, labs and discussed the procedure including the risks, benefits and alternatives for the proposed anesthesia with the patient or authorized representative who has indicated his/her understanding and acceptance.     Dental advisory given  Plan Discussed with: CRNA and Anesthesiologist  Anesthesia Plan  Comments:        Anesthesia Quick Evaluation

## 2022-05-23 ENCOUNTER — Ambulatory Visit (HOSPITAL_BASED_OUTPATIENT_CLINIC_OR_DEPARTMENT_OTHER)
Admission: RE | Admit: 2022-05-23 | Discharge: 2022-05-23 | Disposition: A | Discharge status: Home / Self Care | Payer: Medicare PPO | Attending: Orthopedic Surgery | Admitting: Orthopedic Surgery

## 2022-05-23 ENCOUNTER — Ambulatory Visit (HOSPITAL_COMMUNITY): Payer: Medicare PPO

## 2022-05-23 ENCOUNTER — Other Ambulatory Visit: Payer: Self-pay

## 2022-05-23 ENCOUNTER — Encounter (HOSPITAL_BASED_OUTPATIENT_CLINIC_OR_DEPARTMENT_OTHER): Payer: Self-pay | Admitting: Orthopedic Surgery

## 2022-05-23 ENCOUNTER — Ambulatory Visit (HOSPITAL_BASED_OUTPATIENT_CLINIC_OR_DEPARTMENT_OTHER): Payer: Medicare PPO | Admitting: Anesthesiology

## 2022-05-23 ENCOUNTER — Encounter (HOSPITAL_BASED_OUTPATIENT_CLINIC_OR_DEPARTMENT_OTHER)
Admission: RE | Disposition: A | Discharge status: Home / Self Care | Payer: Self-pay | Source: Home / Self Care | Attending: Orthopedic Surgery

## 2022-05-23 DIAGNOSIS — Z8249 Family history of ischemic heart disease and other diseases of the circulatory system: Secondary | ICD-10-CM | POA: Diagnosis not present

## 2022-05-23 DIAGNOSIS — E119 Type 2 diabetes mellitus without complications: Secondary | ICD-10-CM | POA: Insufficient documentation

## 2022-05-23 DIAGNOSIS — I4821 Permanent atrial fibrillation: Secondary | ICD-10-CM | POA: Diagnosis not present

## 2022-05-23 DIAGNOSIS — I2510 Atherosclerotic heart disease of native coronary artery without angina pectoris: Secondary | ICD-10-CM

## 2022-05-23 DIAGNOSIS — Z833 Family history of diabetes mellitus: Secondary | ICD-10-CM | POA: Diagnosis not present

## 2022-05-23 DIAGNOSIS — Z01818 Encounter for other preprocedural examination: Secondary | ICD-10-CM

## 2022-05-23 DIAGNOSIS — I10 Essential (primary) hypertension: Secondary | ICD-10-CM | POA: Diagnosis not present

## 2022-05-23 DIAGNOSIS — Z7984 Long term (current) use of oral hypoglycemic drugs: Secondary | ICD-10-CM | POA: Insufficient documentation

## 2022-05-23 DIAGNOSIS — M19071 Primary osteoarthritis, right ankle and foot: Secondary | ICD-10-CM

## 2022-05-23 DIAGNOSIS — Z79899 Other long term (current) drug therapy: Secondary | ICD-10-CM | POA: Diagnosis not present

## 2022-05-23 DIAGNOSIS — M19171 Post-traumatic osteoarthritis, right ankle and foot: Secondary | ICD-10-CM | POA: Diagnosis not present

## 2022-05-23 DIAGNOSIS — I4891 Unspecified atrial fibrillation: Secondary | ICD-10-CM | POA: Diagnosis not present

## 2022-05-23 DIAGNOSIS — G8918 Other acute postprocedural pain: Secondary | ICD-10-CM | POA: Diagnosis not present

## 2022-05-23 DIAGNOSIS — Z8673 Personal history of transient ischemic attack (TIA), and cerebral infarction without residual deficits: Secondary | ICD-10-CM | POA: Insufficient documentation

## 2022-05-23 HISTORY — PX: TOTAL ANKLE ARTHROPLASTY: SHX811

## 2022-05-23 LAB — GLUCOSE, CAPILLARY
Glucose-Capillary: 89 mg/dL (ref 70–99)
Glucose-Capillary: 152 mg/dL — ABNORMAL HIGH (ref 70–99)

## 2022-05-23 SURGERY — ARTHROPLASTY, ANKLE, TOTAL
Anesthesia: General | Site: Ankle | Laterality: Right

## 2022-05-23 MED ORDER — DEXAMETHASONE SODIUM PHOSPHATE 10 MG/ML IJ SOLN
INTRAMUSCULAR | Status: DC | PRN
  Administered 2022-05-23: 4 mg via INTRAVENOUS

## 2022-05-23 MED ORDER — PHENYLEPHRINE HCL (PRESSORS) 10 MG/ML IV SOLN
INTRAVENOUS | Status: AC
  Filled 2022-05-23: qty 1

## 2022-05-23 MED ORDER — BUPIVACAINE-EPINEPHRINE (PF) 0.5% -1:200000 IJ SOLN
INTRAMUSCULAR | Status: DC | PRN
  Administered 2022-05-23: 20 mL
  Administered 2022-05-23: 25 mL

## 2022-05-23 MED ORDER — EPHEDRINE 5 MG/ML INJ
INTRAVENOUS | Status: AC
  Filled 2022-05-23: qty 15

## 2022-05-23 MED ORDER — LIDOCAINE HCL (CARDIAC) PF 100 MG/5ML IV SOSY
PREFILLED_SYRINGE | INTRAVENOUS | Status: DC | PRN
  Administered 2022-05-23: 30 mg via INTRAVENOUS

## 2022-05-23 MED ORDER — PROPOFOL 10 MG/ML IV BOLUS
INTRAVENOUS | Status: AC
  Filled 2022-05-23: qty 20

## 2022-05-23 MED ORDER — DEXAMETHASONE SODIUM PHOSPHATE 10 MG/ML IJ SOLN
INTRAMUSCULAR | Status: AC
  Filled 2022-05-23: qty 2

## 2022-05-23 MED ORDER — PHENYLEPHRINE HCL-NACL 20-0.9 MG/250ML-% IV SOLN
INTRAVENOUS | Status: DC | PRN
  Administered 2022-05-23: 20 ug/min via INTRAVENOUS

## 2022-05-23 MED ORDER — VANCOMYCIN HCL 500 MG IV SOLR
INTRAVENOUS | Status: DC | PRN
  Administered 2022-05-23: 500 mg via TOPICAL

## 2022-05-23 MED ORDER — VANCOMYCIN HCL 500 MG IV SOLR
INTRAVENOUS | Status: AC
  Filled 2022-05-23: qty 10

## 2022-05-23 MED ORDER — LIDOCAINE 2% (20 MG/ML) 5 ML SYRINGE
INTRAMUSCULAR | Status: AC
  Filled 2022-05-23: qty 15

## 2022-05-23 MED ORDER — PROPOFOL 500 MG/50ML IV EMUL
INTRAVENOUS | Status: AC
  Filled 2022-05-23: qty 100

## 2022-05-23 MED ORDER — FENTANYL CITRATE (PF) 100 MCG/2ML IJ SOLN
INTRAMUSCULAR | Status: AC
  Filled 2022-05-23: qty 2

## 2022-05-23 MED ORDER — 0.9 % SODIUM CHLORIDE (POUR BTL) OPTIME
TOPICAL | Status: DC | PRN
  Administered 2022-05-23: 400 mL

## 2022-05-23 MED ORDER — CEFAZOLIN SODIUM-DEXTROSE 2-4 GM/100ML-% IV SOLN
2.0000 g | INTRAVENOUS | Status: AC
  Administered 2022-05-23: 2 g via INTRAVENOUS

## 2022-05-23 MED ORDER — BUPIVACAINE-EPINEPHRINE (PF) 0.25% -1:200000 IJ SOLN
INTRAMUSCULAR | Status: AC
  Filled 2022-05-23: qty 30

## 2022-05-23 MED ORDER — LACTATED RINGERS IV SOLN: INTRAVENOUS | Status: DC

## 2022-05-23 MED ORDER — EPHEDRINE SULFATE (PRESSORS) 50 MG/ML IJ SOLN
INTRAMUSCULAR | Status: DC | PRN
  Administered 2022-05-23: 10 mg via INTRAVENOUS

## 2022-05-23 MED ORDER — DOCUSATE SODIUM 100 MG PO CAPS: 100.0000 mg | ORAL_CAPSULE | Freq: Two times a day (BID) | ORAL | 0 refills | Status: DC

## 2022-05-23 MED ORDER — OXYCODONE HCL 5 MG PO TABS: 5.0000 mg | ORAL_TABLET | Freq: Once | ORAL | Status: DC | PRN

## 2022-05-23 MED ORDER — RIVAROXABAN 10 MG PO TABS: 10.0000 mg | ORAL_TABLET | Freq: Every day | ORAL | 0 refills | Status: DC

## 2022-05-23 MED ORDER — MIDAZOLAM HCL 2 MG/2ML IJ SOLN
INTRAMUSCULAR | Status: AC
  Filled 2022-05-23: qty 2

## 2022-05-23 MED ORDER — ACETAMINOPHEN 500 MG PO TABS
ORAL_TABLET | ORAL | Status: AC
  Filled 2022-05-23: qty 2

## 2022-05-23 MED ORDER — PROPOFOL 500 MG/50ML IV EMUL
INTRAVENOUS | Status: DC | PRN
  Administered 2022-05-23: 25 ug/kg/min via INTRAVENOUS

## 2022-05-23 MED ORDER — SENNA 8.6 MG PO TABS: 2.0000 | ORAL_TABLET | Freq: Two times a day (BID) | ORAL | 0 refills | Status: DC

## 2022-05-23 MED ORDER — OXYCODONE HCL 5 MG PO TABS: 5.0000 mg | ORAL_TABLET | ORAL | 0 refills | Status: AC | PRN

## 2022-05-23 MED ORDER — PROPOFOL 500 MG/50ML IV EMUL
INTRAVENOUS | Status: AC
  Filled 2022-05-23: qty 150

## 2022-05-23 MED ORDER — OXYCODONE HCL 5 MG/5ML PO SOLN: 5.0000 mg | Freq: Once | ORAL | Status: DC | PRN

## 2022-05-23 MED ORDER — SODIUM CHLORIDE 0.9 % IV SOLN: INTRAVENOUS | Status: DC

## 2022-05-23 MED ORDER — GLYCOPYRROLATE 0.2 MG/ML IJ SOLN
INTRAMUSCULAR | Status: DC | PRN
  Administered 2022-05-23: .1 mg via INTRAVENOUS

## 2022-05-23 MED ORDER — DEXMEDETOMIDINE HCL IN NACL 80 MCG/20ML IV SOLN
INTRAVENOUS | Status: AC
  Filled 2022-05-23: qty 20

## 2022-05-23 MED ORDER — FENTANYL CITRATE (PF) 100 MCG/2ML IJ SOLN: 25.0000 ug | INTRAMUSCULAR | Status: DC | PRN

## 2022-05-23 MED ORDER — ONDANSETRON HCL 4 MG/2ML IJ SOLN: 4.0000 mg | Freq: Once | INTRAMUSCULAR | Status: DC | PRN

## 2022-05-23 MED ORDER — PROPOFOL 10 MG/ML IV BOLUS
INTRAVENOUS | Status: DC | PRN
  Administered 2022-05-23: 200 mg via INTRAVENOUS

## 2022-05-23 MED ORDER — PHENYLEPHRINE HCL (PRESSORS) 10 MG/ML IV SOLN
INTRAVENOUS | Status: DC | PRN
  Administered 2022-05-23: 10 mg via INTRAVENOUS
  Administered 2022-05-23: 40 ug via INTRAVENOUS
  Administered 2022-05-23: 10 mg via INTRAVENOUS

## 2022-05-23 MED ORDER — ONDANSETRON HCL 4 MG/2ML IJ SOLN
INTRAMUSCULAR | Status: AC
  Filled 2022-05-23: qty 14

## 2022-05-23 MED ORDER — FENTANYL CITRATE (PF) 100 MCG/2ML IJ SOLN
100.0000 ug | Freq: Once | INTRAMUSCULAR | Status: AC
  Administered 2022-05-23: 50 ug via INTRAVENOUS

## 2022-05-23 MED ORDER — ACETAMINOPHEN 500 MG PO TABS
1000.0000 mg | ORAL_TABLET | Freq: Once | ORAL | Status: AC
  Administered 2022-05-23: 1000 mg via ORAL

## 2022-05-23 MED ORDER — CEFAZOLIN SODIUM-DEXTROSE 2-4 GM/100ML-% IV SOLN
INTRAVENOUS | Status: AC
  Filled 2022-05-23: qty 100

## 2022-05-23 MED ORDER — PHENYLEPHRINE 80 MCG/ML (10ML) SYRINGE FOR IV PUSH (FOR BLOOD PRESSURE SUPPORT)
PREFILLED_SYRINGE | INTRAVENOUS | Status: AC
  Filled 2022-05-23: qty 10

## 2022-05-23 SURGICAL SUPPLY — 82 items
APL PRP STRL LF DISP 70% ISPRP (MISCELLANEOUS) ×1
BANDAGE ESMARK 6X9 LF (GAUZE/BANDAGES/DRESSINGS) IMPLANT
BLADE RECIPRO TAPERED (BLADE) ×2 IMPLANT
BLADE SAW OSC ANKLE 8X63X1.19 (BLADE) IMPLANT
BLADE SAW RECIP ANKLE 8X50X1 (PIN) IMPLANT
BLADE SURG 15 STRL LF DISP TIS (BLADE) ×6 IMPLANT
BLADE SURG 15 STRL SS (BLADE) ×3
BNDG CMPR 5X62 HK CLSR LF (GAUZE/BANDAGES/DRESSINGS)
BNDG CMPR 6"X 5 YARDS HK CLSR (GAUZE/BANDAGES/DRESSINGS)
BNDG CMPR 9X6 STRL LF SNTH (GAUZE/BANDAGES/DRESSINGS)
BNDG ELASTIC 4X5.8 VLCR STR LF (GAUZE/BANDAGES/DRESSINGS) ×4 IMPLANT
BNDG ELASTIC 6INX 5YD STR LF (GAUZE/BANDAGES/DRESSINGS) IMPLANT
BNDG ESMARK 6X9 LF (GAUZE/BANDAGES/DRESSINGS)
CHLORAPREP W/TINT 26 (MISCELLANEOUS) ×2 IMPLANT
CLIP LOCK TOTAL ANKLE SZ4 (Orthopedic Implant) IMPLANT
COVER BACK TABLE 60X90IN (DRAPES) ×4 IMPLANT
COVER MAYO STAND STRL (DRAPES) IMPLANT
CUFF TOURN SGL QUICK 34 (TOURNIQUET CUFF)
CUFF TRNQT CYL 34X4.125X (TOURNIQUET CUFF) IMPLANT
DRAPE C-ARM 42X72 X-RAY (DRAPES) ×2 IMPLANT
DRAPE C-ARMOR (DRAPES) ×2 IMPLANT
DRAPE EXTREMITY T 121X128X90 (DISPOSABLE) ×2 IMPLANT
DRAPE INCISE IOBAN 66X45 STRL (DRAPES) IMPLANT
DRAPE U-SHAPE 47X51 STRL (DRAPES) ×2 IMPLANT
DRESSING MEPILEX FLEX 4X4 (GAUZE/BANDAGES/DRESSINGS) ×2 IMPLANT
DRSG MEPILEX FLEX 4X4 (GAUZE/BANDAGES/DRESSINGS) ×1
DRSG MEPITEL 4X7.2 (GAUZE/BANDAGES/DRESSINGS) ×2 IMPLANT
ELECT REM PT RETURN 9FT ADLT (ELECTROSURGICAL) ×1
ELECTRODE REM PT RTRN 9FT ADLT (ELECTROSURGICAL) ×2 IMPLANT
GAUZE PAD ABD 8X10 STRL (GAUZE/BANDAGES/DRESSINGS) ×4 IMPLANT
GAUZE SPONGE 4X4 12PLY STRL (GAUZE/BANDAGES/DRESSINGS) ×2 IMPLANT
GLOVE BIO SURGEON STRL SZ8 (GLOVE) ×2 IMPLANT
GLOVE BIOGEL PI IND STRL 7.0 (GLOVE) IMPLANT
GLOVE BIOGEL PI IND STRL 7.5 (GLOVE) IMPLANT
GLOVE BIOGEL PI IND STRL 8 (GLOVE) ×4 IMPLANT
GLOVE ECLIPSE 8.0 STRL XLNG CF (GLOVE) ×2 IMPLANT
GLOVE SURG SS PI 7.0 STRL IVOR (GLOVE) IMPLANT
GOWN STRL REUS W/ TWL LRG LVL3 (GOWN DISPOSABLE) ×2 IMPLANT
GOWN STRL REUS W/ TWL XL LVL3 (GOWN DISPOSABLE) ×2 IMPLANT
GOWN STRL REUS W/TWL LRG LVL3 (GOWN DISPOSABLE) ×2
GOWN STRL REUS W/TWL XL LVL3 (GOWN DISPOSABLE) ×3 IMPLANT
IMPL TOTAL ANKLE TALAR SZ3 (Orthopedic Implant) IMPLANT
IMPLANT TOTAL ANKLE TALAR SZ3 (Orthopedic Implant) ×1 IMPLANT
INSERT TIB FB SZ 3 R (Insert) IMPLANT
NDL HYPO 22X1.5 SAFETY MO (MISCELLANEOUS) IMPLANT
NEEDLE HYPO 22X1.5 SAFETY MO (MISCELLANEOUS) IMPLANT
NEEDLE SAFETY HYPO 22GAX1.5 (MISCELLANEOUS)
NS IRRIG 1000ML POUR BTL (IV SOLUTION) ×2 IMPLANT
PACK BASIN DAY SURGERY FS (CUSTOM PROCEDURE TRAY) ×2 IMPLANT
PAD CAST 4YDX4 CTTN HI CHSV (CAST SUPPLIES) ×4 IMPLANT
PADDING CAST ABS COTTON 4X4 ST (CAST SUPPLIES) IMPLANT
PADDING CAST COTTON 4X4 STRL (CAST SUPPLIES) ×2
PADDING CAST COTTON 6X4 STRL (CAST SUPPLIES) ×2 IMPLANT
PENCIL SMOKE EVACUATOR (MISCELLANEOUS) ×2 IMPLANT
PIN POUCH TALAR VANTAGE 3.5 (PIN) IMPLANT
PLATE VANTAGE TOTAL ANKLE SZ4 (Plate) IMPLANT
POUCH SCREW TALAR ANKLE (ORTHOPEDIC DISPOSABLE SUPPLIES) IMPLANT
SANITIZER HAND PURELL FF 515ML (MISCELLANEOUS) ×2 IMPLANT
SAW STRYKER ANKLE 10X75X1.19 (BLADE) IMPLANT
SCOTCHCAST PLUS 3X4 WHITE (CAST SUPPLIES) IMPLANT
SCOTCHCAST PLUS 4X4 WHITE (CAST SUPPLIES) IMPLANT
SHEET MEDIUM DRAPE 40X70 STRL (DRAPES) ×2 IMPLANT
SLEEVE SCD COMPRESS KNEE MED (STOCKING) ×2 IMPLANT
SPIKE FLUID TRANSFER (MISCELLANEOUS) IMPLANT
SPONGE T-LAP 18X18 ~~LOC~~+RFID (SPONGE) ×2 IMPLANT
STOCKINETTE 6  STRL (DRAPES) ×1
STOCKINETTE 6 STRL (DRAPES) ×2 IMPLANT
SUCTION FRAZIER HANDLE 10FR (MISCELLANEOUS) ×1
SUCTION TUBE FRAZIER 10FR DISP (MISCELLANEOUS) IMPLANT
SURGILUBE 2OZ TUBE FLIPTOP (MISCELLANEOUS) ×2 IMPLANT
SUT ETHILON 3 0 PS 1 (SUTURE) ×4 IMPLANT
SUT MNCRL AB 3-0 PS2 18 (SUTURE) ×4 IMPLANT
SUT VIC AB 0 CT1 27 (SUTURE) ×1
SUT VIC AB 0 CT1 27XBRD ANBCTR (SUTURE) ×2 IMPLANT
SUT VIC AB 2-0 SH 27 (SUTURE)
SUT VIC AB 2-0 SH 27XBRD (SUTURE) IMPLANT
SYR 20ML LL LF (SYRINGE) IMPLANT
SYR BULB IRRIG 60ML STRL (SYRINGE) ×2 IMPLANT
TOWEL GREEN STERILE FF (TOWEL DISPOSABLE) ×4 IMPLANT
TUBE CONNECTING 20X1/4 (TUBING) ×2 IMPLANT
UNDERPAD 30X36 HEAVY ABSORB (UNDERPADS AND DIAPERS) ×2 IMPLANT
YANKAUER SUCT BULB TIP NO VENT (SUCTIONS) ×2 IMPLANT

## 2022-05-23 NOTE — Anesthesia Procedure Notes (Signed)
Anesthesia Regional Block: Adductor canal block   Pre-Anesthetic Checklist: , timeout performed,  Correct Patient, Correct Site, Correct Laterality,  Correct Procedure, Correct Position, site marked,  Risks and benefits discussed,  Surgical consent,  Pre-op evaluation,  At surgeon's request and post-op pain management  Laterality: Right  Prep: chloraprep       Needles:  Injection technique: Single-shot  Needle Type: Echogenic Needle     Needle Length: 10cm  Needle Gauge: 21     Additional Needles:   Narrative:  Start time: 05/23/2022 7:02 AM End time: 05/23/2022 7:05 AM Injection made incrementally with aspirations every 5 mL.  Performed by: Personally  Anesthesiologist: Audry Pili, MD  Additional Notes: No pain on injection. No increased resistance to injection. Injection made in 5cc increments. Good needle visualization. Patient tolerated the procedure well.

## 2022-05-23 NOTE — Discharge Instructions (Addendum)
Norman Simmer, MD [facility]  Please read the following information regarding your care after surgery.  Medications  You only need a prescription for the narcotic pain medicine (ex. oxycodone, Percocet, Norco).  All of the other medicines listed below are available over the counter. ? Aleve 2 pills twice a day for the first 3 days after surgery. ? acetominophen (Tylenol) 650 mg every 4-6 hours as you need for minor to moderate pain ? oxycodone as prescribed for severe pain  Narcotic pain medicine (ex. oxycodone, Percocet, Vicodin) will cause constipation.  To prevent this problem, take the following medicines while you are taking any pain medicine. ? docusate sodium (Colace) 100 mg twice a day ? senna (Senokot) 2 tablets twice a day  ? To help prevent blood clots, take Xarelto as prescribed for two weeks after surgery.  You should also get up every hour while you are awake to move around.    Weight Bearing ? Do not bear any weight on the operated leg or foot.  Cast / Splint / Dressing ? Keep your splint, cast or dressing clean and dry.  Don't put anything (coat hanger, pencil, etc) down inside of it.  If it gets damp, use a hair dryer on the cool setting to dry it.  If it gets soaked, call the office to schedule an appointment for a cast change.   After your dressing, cast or splint is removed; you may shower, but do not soak or scrub the wound.  Allow the water to run over it, and then gently pat it dry.  Swelling It is normal for you to have swelling where you had surgery.  To reduce swelling and pain, keep your toes above your nose for at least 3 days after surgery.  It may be necessary to keep your foot or leg elevated for several weeks.  If it hurts, it should be elevated.  Follow Up Call my office at [phone] when you are discharged from the hospital or surgery center to schedule an appointment to be seen 3 weeks after surgery.  Call my office at [phone] if you develop a  fever >101.5 F, nausea, vomiting, bleeding from the surgical site or severe pain.     May take Tylenol after 12:50pm, if needed.     Post Anesthesia Home Care Instructions  Activity: Get plenty of rest for the remainder of the day. A responsible individual must stay with you for 24 hours following the procedure.  For the next 24 hours, DO NOT: -Drive a car -Paediatric nurse -Drink alcoholic beverages -Take any medication unless instructed by your physician -Make any legal decisions or sign important papers.  Meals: Start with liquid foods such as gelatin or soup. Progress to regular foods as tolerated. Avoid greasy, spicy, heavy foods. If nausea and/or vomiting occur, drink only clear liquids until the nausea and/or vomiting subsides. Call your physician if vomiting continues.  Special Instructions/Symptoms: Your throat may feel dry or sore from the anesthesia or the breathing tube placed in your throat during surgery. If this causes discomfort, gargle with warm salt water. The discomfort should disappear within 24 hours.  If you had a scopolamine patch placed behind your ear for the management of post- operative nausea and/or vomiting:  1. The medication in the patch is effective for 72 hours, after which it should be removed.  Wrap patch in a tissue and discard in the trash. Wash hands thoroughly with soap and water. 2. You may remove the patch earlier  than 72 hours if you experience unpleasant side effects which may include dry mouth, dizziness or visual disturbances. 3. Avoid touching the patch. Wash your hands with soap and water after contact with the patch.   Regional Anesthesia Blocks  1. Numbness or the inability to move the "blocked" extremity may last from 3-48 hours after placement. The length of time depends on the medication injected and your individual response to the medication. If the numbness is not going away after 48 hours, call your surgeon.  2. The extremity  that is blocked will need to be protected until the numbness is gone and the  Strength has returned. Because you cannot feel it, you will need to take extra care to avoid injury. Because it may be weak, you may have difficulty moving it or using it. You may not know what position it is in without looking at it while the block is in effect.  3. For blocks in the legs and feet, returning to weight bearing and walking needs to be done carefully. You will need to wait until the numbness is entirely gone and the strength has returned. You should be able to move your leg and foot normally before you try and bear weight or walk. You will need someone to be with you when you first try to ensure you do not fall and possibly risk injury.  4. Bruising and tenderness at the needle site are common side effects and will resolve in a few days.  5. Persistent numbness or new problems with movement should be communicated to the surgeon or the [place] [phone] [place] [phone]).

## 2022-05-23 NOTE — Anesthesia Procedure Notes (Signed)
Procedure Name: LMA Insertion Date/Time: 05/23/2022 7:37 AM  Performed by: Shakendra Griffeth, Ernesta Amble, CRNAPre-anesthesia Checklist: Patient identified, Emergency Drugs available, Suction available and Patient being monitored Patient Re-evaluated:Patient Re-evaluated prior to induction Oxygen Delivery Method: Circle system utilized Preoxygenation: Pre-oxygenation with 100% oxygen Induction Type: IV induction Ventilation: Mask ventilation without difficulty LMA: LMA inserted LMA Size: 4.0 Number of attempts: 1 Airway Equipment and Method: Bite block Placement Confirmation: positive ETCO2 Tube secured with: Tape Dental Injury: Teeth and Oropharynx as per pre-operative assessment

## 2022-05-23 NOTE — Transfer of Care (Signed)
Immediate Anesthesia Transfer of Care Note  Patient: Juanfrancisco Bosket Langlinais III  Procedure(s) Performed: TOTAL ANKLE ARTHROPLASTY, possible heel cord lengthening (Right: Ankle)  Patient Location: PACU  Anesthesia Type:GA combined with regional for post-op pain  Level of Consciousness: drowsy and patient cooperative  Airway & Oxygen Therapy: Patient Spontanous Breathing and Patient connected to face mask oxygen  Post-op Assessment: Report given to RN and Post -op Vital signs reviewed and stable  Post vital signs: Reviewed and stable  Last Vitals:  Vitals Value Taken Time  BP    Temp    Pulse 51 05/23/22 0930  Resp 14 05/23/22 0930  SpO2 93 % 05/23/22 0930  Vitals shown include unvalidated device data.  Last Pain:  Vitals:   05/23/22 0638  TempSrc: Oral  PainSc: 0-No pain      Patients Stated Pain Goal: 5 ([id number])  Complications: No notable events documented.

## 2022-05-23 NOTE — H&P (Signed)
Norman [name] is an 77 y.o. male.   Chief Complaint: Right ankle pain HPI: 77 year old male with worsening right ankle pain due to right ankle arthritis.  He has failed nonoperative treatment including activity modification, oral anti-inflammatories and bracing.  He presents today for surgical treatment of this painful and limiting condition.  Past Medical History:  Diagnosis Date   Anxiety    Blood in urine 2 1/2 WEEKS AGO   BPH (benign prostatic hypertrophy)    Mild   Coronary artery calcification seen on CAT scan 2011   no ischemia on nuclear stress test 2011   Dilated aortic root (HCC)    87m by echo 2017   Dysrhythmia    afib   Epididymal cyst 2005   Bilateral and small bilateral hydroceles with most recent ultra sound   Glaucoma    History of kidney stones    Hypercholesteremia    Hypertension    Kidney stones    "I've passed them all"   Migraine    "got off caffeine; they went away"   Permanent atrial fibrillation ([facility]    Stroke ([facility]    TIA ? - 2009    Tendinitis    History in Rotator cuff   TIA (transient ischemic attack) ?12/28/2013   [place] II diabetes [place]     Past Surgical History:  Procedure Laterality Date   CARDIOVERSION  X 1   CYSTOSCOPY WITH LITHOLAPAXY N/A 12/05/2017   Procedure: CYSTOSCOPY WITH LITHOLAPAXY;  Surgeon: OKathie Rhodes MD;  Location: [facility];  Service: Urology;  Laterality: N/A;   CYSTOSCOPY WITH RETROGRADE PYELOGRAM, URETEROSCOPY AND STENT PLACEMENT Right 08/18/2015   Procedure: CYSTOSCOPY WITH RIGHT  RETROGRADE PYELOGRAM, RIGHT URETEROGRAM.;  Surgeon: MKathie Rhodes MD;  Location: [facility];  Service: Urology;  Laterality: Right;   LEFT ATRIAL APPENDAGE OCCLUSION N/A 08/03/2020   Procedure: LEFT ATRIAL APPENDAGE OCCLUSION;  Surgeon: LVickie Epley MD;  [place];  Service: Cardiovascular;  Laterality: N/A;   TEE WITHOUT CARDIOVERSION N/A 08/03/2020   Procedure: TRANSESOPHAGEAL ECHOCARDIOGRAM (TEE);   Surgeon: LVickie Epley MD;  [place] [facility];  Service: Cardiovascular;  Laterality: N/A;   TEE WITHOUT CARDIOVERSION N/A 09/26/2020   Procedure: TRANSESOPHAGEAL ECHOCARDIOGRAM (TEE);  Surgeon: OGeralynn Rile MD;  [facility];  Service: Cardiovascular;  Laterality: N/A;   TONSILLECTOMY      Family History  Problem Relation Age of Onset   Kidney failure Mother    Heart attack Father        stenosis   Diabetes Maternal Aunt    Social History:  reports that he has never smoked. He has never used smokeless tobacco. He reports current alcohol use of about 1.0 standard drink of alcohol per week. He reports that he does not use drugs.  Allergies: No Known Allergies  Medications Prior to Admission  Medication Sig Dispense Refill   aspirin EC 81 MG tablet Take 81 mg by mouth daily.     atorvastatin (LIPITOR) 80 MG tablet Take 80 mg by mouth every evening.      chlorthalidone (HYGROTON) 25 MG tablet Take 12.5 mg by mouth daily.      COMBIGAN 0.2-0.5 % ophthalmic solution Place 1 drop into both eyes 2 (two) times daily.     diltiazem (TIAZAC) 240 MG 24 hr capsule Take 240 mg by mouth daily.     ezetimibe (ZETIA) 10 MG tablet TAKE ONE TABLET BY MOUTH ONE TIME DAILY 90 tablet 3  latanoprost (XALATAN) 0.005 % ophthalmic solution SMARTSIG:In Eye(s)     LUMIGAN 0.01 % SOLN Place 1 drop into both eyes at bedtime.     metFORMIN (GLUCOPHAGE) 500 MG tablet Take 500 mg by mouth daily with breakfast.     metoprolol tartrate (LOPRESSOR) 25 MG tablet Take 25 mg by mouth 2 (two) times daily.     tamsulosin (FLOMAX) 0.4 MG CAPS capsule Take 0.4 mg by mouth daily.     valsartan (DIOVAN) 320 MG tablet Take 320 mg by mouth daily.     zolpidem (AMBIEN CR) 12.5 MG CR tablet Take 12.5 mg by mouth at bedtime.      Results for orders placed or performed during the hospital encounter of 06-08-22 (from the past 48 hour(s))  Glucose, capillary     Status: None   Collection Time:  06-08-22  6:49 AM  Result Value Ref Range   Glucose-Capillary 89 70 - 99 mg/dL    Comment: Glucose reference range applies only to samples taken after fasting for at least 8 hours.   No results found.  Review of Systems no recent fever, chills, nausea, vomiting or changes in his appetite  Blood pressure 136/75, pulse (!) 48, temperature 97.7 F (36.5 C), temperature source Oral, resp. rate 16, height '5\' 11"'$  (1.803 m), weight 102.9 kg, SpO2 97 %. Physical Exam  Well-nourished well-developed man in no apparent distress.  Alert and oriented.  Normal mood and affect.  Gait is antalgic to the right.  The right ankle has healthy skin.  Slight swelling around the ankle.  Pulses are palpable in the foot.  No lymphadenopathy.  5 out of 5 strength in plantarflexion and dorsiflexion of the ankle and toes.   Assessment/Plan Right ankle arthritis -to the operating room today for right total ankle replacement.  The risks and benefits of the alternative treatment options have been discussed in detail.  The patient wishes to proceed with surgery and specifically understands risks of bleeding, infection, nerve damage, blood clots, need for additional surgery, amputation and death.   Wylene Simmer, MD June 08, 2022, 7:16 AM

## 2022-05-23 NOTE — Progress Notes (Signed)
Assisted Dr. Fransisco Beau with right, adductor canal, popliteal block. Side rails up, monitors on throughout procedure. See vital signs in flow sheet. Tolerated Procedure well.

## 2022-05-23 NOTE — Anesthesia Postprocedure Evaluation (Signed)
Anesthesia Post Note  Patient: Norman [name]  Procedure(s) Performed: TOTAL ANKLE ARTHROPLASTY, possible heel cord lengthening (Right: Ankle)     Patient location during evaluation: PACU Anesthesia Type: General Level of consciousness: awake and alert Pain management: pain level controlled Vital Signs Assessment: post-procedure vital signs reviewed and stable Respiratory status: spontaneous breathing, nonlabored ventilation and respiratory function stable Cardiovascular status: stable, blood pressure returned to baseline and bradycardic Anesthetic complications: no   No notable events documented.  Last Vitals:  Vitals:   05/23/22 1000 05/23/22 1030  BP: 112/67 113/73  Pulse: (!) 46 (!) 50  Resp: 18 18  Temp:  36.8 C  SpO2: 92% 93%    Last Pain:  Vitals:   05/23/22 1030  TempSrc:   PainSc: 0-No pain                 Audry Pili

## 2022-05-23 NOTE — Anesthesia Procedure Notes (Signed)
Anesthesia Regional Block: Popliteal block   Pre-Anesthetic Checklist: , timeout performed,  Correct Patient, Correct Site, Correct Laterality,  Correct Procedure, Correct Position, site marked,  Risks and benefits discussed,  Surgical consent,  Pre-op evaluation,  At surgeon's request and post-op pain management  Laterality: Right  Prep: chloraprep       Needles:  Injection technique: Single-shot  Needle Type: Echogenic Needle     Needle Length: 10cm  Needle Gauge: 21     Additional Needles:   Narrative:  Start time: 05/23/2022 7:06 AM End time: 05/23/2022 7:09 AM Injection made incrementally with aspirations every 5 mL.  Performed by: Personally  Anesthesiologist: Audry Pili, MD  Additional Notes: No pain on injection. No increased resistance to injection. Injection made in 5cc increments. Good needle visualization. Patient tolerated the procedure well.

## 2022-05-23 NOTE — Op Note (Signed)
05/23/2022  9:35 AM  PATIENT:  Norman [name]  77 y.o. male  PRE-OPERATIVE DIAGNOSIS: Right ankle arthritis  POST-OPERATIVE DIAGNOSIS: Right ankle arthritis  Procedure(s):  right total ankle arthroplasty ([employer] Vantage - Size 4 tibia, Size 3 talus, 8 mm poly)  SURGEON:  Wylene Simmer, MD  ASSISTANT: Mechele Claude, PA-C  ANESTHESIA:   General, regional  EBL:  minimal   TOURNIQUET:   Total Tourniquet Time Documented: Thigh (Right) - 87 minutes Total: Thigh (Right) - 87 minutes  COMPLICATIONS:  None apparent  DISPOSITION:  Extubated, awake and stable to recovery.  INDICATION FOR PROCEDURE: 77 year old male with a long history of worsening right ankle pain due to posttraumatic arthritis.  He has failed nonoperative treatment and presents today for right total ankle arthroplasty.  The risks and benefits of the alternative treatment options have been discussed in detail.  The patient wishes to proceed with surgery and specifically understands risks of bleeding, infection, nerve damage, blood clots, need for additional surgery, amputation and death.   PROCEDURE IN DETAIL:  After pre operative consent was obtained, and the correct operative site was identified, the patient was brought to the operating room and placed supine on the OR table.  Anesthesia was administered.  Pre-operative antibiotics were administered.  A surgical timeout was taken.  The right lower extremity was prepped and draped in standard sterile fashion with a tourniquet around the thigh.  The extremity was elevated, and the tourniquet was inflated to 250 mmHg.  An incision was made over the anterior joint over the EHL.  Dissection was carried sharply down through the subcutaneous tissues.  The extensor retinaculum was incised over the EHL and released proximally and distally.  The interval between the tibialis anterior and EHL was developed.  The neurovascular bundle was identified.  It was mobilized and retracted  laterally.  It was protected throughout the case.  The anterior joint capsule was incised and elevated medially and laterally anterior osteophytes were resected with a curved osteotome.  The tibial alignment guide was applied in line with the medial gutter.  The alignment was checked in the AP plane.  Rotation was set in the cutting block provisionally pinned.  A lateral view was then obtained.  The resection height was set followed by the slope.  The block was then pinned.  An AP view confirmed appropriate varus valgus alignment.  The distal block was then translated in line with the medial gutter.  The block was pinned.  The distal tibial cut was made with reciprocating and oscillating saws.  The cut bone was removed.  The talar cutting block was then positioned after removing the remaining articular cartilage from the talus.  A lateral view confirmed appropriate position of the cutting block.  It was pinned.  The talar dome cut was made.  Cut bone was removed.  A size 4 sizer was successfully inserted.  A size 4 talar lollipop was too large.  A size 3 was the correct size.  The lateral view confirmed appropriate position of the lollipop.  It was pinned and removed.  The talar cutting block was inserted and pinned.  The 4 talar cuts were made in the block removed.  All cut bone was removed.  Anterior osteophytes were removed with a rondure.  The rasp was used to smooth the cut surfaces.  The trial was inserted and was noted to fit appropriately.  It was pinned.  The tibial trial was inserted along with a trial poly.  The lug holes were drilled in the talar trial.  The tibial trial fit appropriately and was pinned.  The talar trial and the poly were removed.  The tibial lug holes were punched.  All trial implants were removed.  The deep and superficial deltoid ligaments were released allowing symmetric balance medially and laterally.  The wound was irrigated copiously.  The tibial implant was positioned and  impacted into position after sprinkling with vancomycin powder.  The talar implant was also positioned and impacted into position..  A trial poly was inserted.  The ankle was noted to have appropriate dorsiflexion.  The trial poly was removed.  An 8 mm polyethylene implant was inserted and locked in place with the locking clip.  Final AP, mortise and lateral radiographs confirmed appropriate position and size of all hardware.  The wound was irrigated copiously and sprinkled with vancomycin powder.  The anterior joint capsule was repaired with 0 Vicryl.  The extensor retinaculum was repaired with 0 Vicryl.  Subcutaneous tissues were approximated with 3-0 Monocryl.  Skin incision was closed with nylon.  Sterile dressings were applied followed by a well-padded short leg cast.  The tourniquet was released after application of the dressings.  The patient was awakened from anesthesia and transported to the recovery room in stable condition.   FOLLOW UP PLAN: Nonweightbearing on the right lower extremity in a short leg cast for the next 3 weeks.  Follow-up in the office for suture removal and conversion to a cam boot if the incision looks good.  Xarelto for DVT prophylaxis.   Mechele Claude PA-C was present and scrubbed for the duration of the operative case. His assistance was essential in positioning the patient, prepping and draping, gaining and maintaining exposure, performing the operation, closing and dressing the wounds and applying the splint.

## 2022-05-24 ENCOUNTER — Encounter (HOSPITAL_BASED_OUTPATIENT_CLINIC_OR_DEPARTMENT_OTHER): Payer: Self-pay | Admitting: Orthopedic Surgery

## 2022-06-10 DIAGNOSIS — Z4789 Encounter for other orthopedic aftercare: Secondary | ICD-10-CM | POA: Diagnosis not present

## 2022-06-12 DIAGNOSIS — M19079 Primary osteoarthritis, unspecified ankle and foot: Secondary | ICD-10-CM | POA: Diagnosis not present

## 2022-06-20 DIAGNOSIS — H52203 Unspecified astigmatism, bilateral: Secondary | ICD-10-CM | POA: Diagnosis not present

## 2022-06-20 DIAGNOSIS — H401131 Primary open-angle glaucoma, bilateral, mild stage: Secondary | ICD-10-CM | POA: Diagnosis not present

## 2022-06-20 DIAGNOSIS — E119 Type 2 diabetes mellitus without complications: Secondary | ICD-10-CM | POA: Diagnosis not present

## 2022-06-20 DIAGNOSIS — Z961 Presence of intraocular lens: Secondary | ICD-10-CM | POA: Diagnosis not present

## 2022-06-27 DIAGNOSIS — N401 Enlarged prostate with lower urinary tract symptoms: Secondary | ICD-10-CM | POA: Diagnosis not present

## 2022-07-01 DIAGNOSIS — M19071 Primary osteoarthritis, right ankle and foot: Secondary | ICD-10-CM | POA: Diagnosis not present

## 2022-07-01 DIAGNOSIS — Z4889 Encounter for other specified surgical aftercare: Secondary | ICD-10-CM | POA: Diagnosis not present

## 2022-07-04 DIAGNOSIS — Q541 Hypospadias, penile: Secondary | ICD-10-CM | POA: Diagnosis not present

## 2022-07-04 DIAGNOSIS — R3915 Urgency of urination: Secondary | ICD-10-CM | POA: Diagnosis not present

## 2022-07-04 DIAGNOSIS — N401 Enlarged prostate with lower urinary tract symptoms: Secondary | ICD-10-CM | POA: Diagnosis not present

## 2022-07-04 DIAGNOSIS — R3912 Poor urinary stream: Secondary | ICD-10-CM | POA: Diagnosis not present

## 2022-07-17 DIAGNOSIS — M25571 Pain in right ankle and joints of right foot: Secondary | ICD-10-CM | POA: Diagnosis not present

## 2022-07-17 DIAGNOSIS — M25671 Stiffness of right ankle, not elsewhere classified: Secondary | ICD-10-CM | POA: Diagnosis not present

## 2022-07-22 DIAGNOSIS — M25571 Pain in right ankle and joints of right foot: Secondary | ICD-10-CM | POA: Diagnosis not present

## 2022-07-31 DIAGNOSIS — M19071 Primary osteoarthritis, right ankle and foot: Secondary | ICD-10-CM | POA: Diagnosis not present

## 2022-07-31 DIAGNOSIS — Z4789 Encounter for other orthopedic aftercare: Secondary | ICD-10-CM | POA: Diagnosis not present

## 2022-07-31 DIAGNOSIS — M25571 Pain in right ankle and joints of right foot: Secondary | ICD-10-CM | POA: Diagnosis not present

## 2022-08-05 ENCOUNTER — Telehealth: Payer: Self-pay

## 2022-08-05 DIAGNOSIS — M25571 Pain in right ankle and joints of right foot: Secondary | ICD-10-CM | POA: Diagnosis not present

## 2022-08-05 NOTE — Telephone Encounter (Signed)
Called to check in with patient 2 years s/p LAAO (implanted 08/03/2020).  Left message to call back.

## 2022-08-06 NOTE — Telephone Encounter (Signed)
Called to check in with patient, who had LAAO on 08/03/2020. The patient reports doing well with no issues.  Per patient request, scheduled the patient for early yearly visit with Dr. Mayford Knife late November 2024. He was grateful for assistance.

## 2022-08-07 DIAGNOSIS — M25571 Pain in right ankle and joints of right foot: Secondary | ICD-10-CM | POA: Diagnosis not present

## 2022-08-12 DIAGNOSIS — M25571 Pain in right ankle and joints of right foot: Secondary | ICD-10-CM | POA: Diagnosis not present

## 2022-08-15 DIAGNOSIS — M25571 Pain in right ankle and joints of right foot: Secondary | ICD-10-CM | POA: Diagnosis not present

## 2022-08-21 DIAGNOSIS — M25571 Pain in right ankle and joints of right foot: Secondary | ICD-10-CM | POA: Diagnosis not present

## 2022-08-23 DIAGNOSIS — M25671 Stiffness of right ankle, not elsewhere classified: Secondary | ICD-10-CM | POA: Diagnosis not present

## 2022-08-23 DIAGNOSIS — M25571 Pain in right ankle and joints of right foot: Secondary | ICD-10-CM | POA: Diagnosis not present

## 2022-10-10 DIAGNOSIS — I700 Atherosclerosis of aorta: Secondary | ICD-10-CM | POA: Diagnosis not present

## 2022-10-10 DIAGNOSIS — I10 Essential (primary) hypertension: Secondary | ICD-10-CM | POA: Diagnosis not present

## 2022-10-10 DIAGNOSIS — I69398 Other sequelae of cerebral infarction: Secondary | ICD-10-CM | POA: Diagnosis not present

## 2022-10-10 DIAGNOSIS — I4891 Unspecified atrial fibrillation: Secondary | ICD-10-CM | POA: Diagnosis not present

## 2022-10-10 DIAGNOSIS — E119 Type 2 diabetes mellitus without complications: Secondary | ICD-10-CM | POA: Diagnosis not present

## 2022-10-10 DIAGNOSIS — E1169 Type 2 diabetes mellitus with other specified complication: Secondary | ICD-10-CM | POA: Diagnosis not present

## 2022-10-10 DIAGNOSIS — Z8673 Personal history of transient ischemic attack (TIA), and cerebral infarction without residual deficits: Secondary | ICD-10-CM | POA: Diagnosis not present

## 2022-10-10 DIAGNOSIS — G4700 Insomnia, unspecified: Secondary | ICD-10-CM | POA: Diagnosis not present

## 2022-10-10 DIAGNOSIS — E782 Mixed hyperlipidemia: Secondary | ICD-10-CM | POA: Diagnosis not present

## 2022-10-10 DIAGNOSIS — M19079 Primary osteoarthritis, unspecified ankle and foot: Secondary | ICD-10-CM | POA: Diagnosis not present

## 2022-12-02 NOTE — Progress Notes (Unsigned)
Cardiology Office Note:  .   Date:  12/02/2022  ID:  Norman [name], DOB 1945/10/05, MRN [id number] PCP: Tally Joe, MD  [facility] Providers Cardiologist:  Armanda Magic, MD Electrophysiologist:  Lanier Prude, MD {  History of Present Illness: .   Norman [name] is a 77 y.o. male w/PMHx of HTN, HLD, TIA (?stroke), DM, permanent AFib (s/p watchman)  He saw Dr. Lalla Brothers 05/21/21, was doing well, at this juncture, off Plavix, on ASA alone. In progress with work on his R ankle Rate controlled AFib, on the slwoer side, discussed perhaps need to reduce nodal blocker in the future if symptoms of bradycardia develop  He saw Dr. Mayford Knife 04/15/22, doing well, pending surgery, felt a acceptable cardiac candidate for the procedure.  No changes were made.  Today's visit is scheduled as a 6 mo visit  ROS: ***  *** symptoms *** brady?  Arrhythmia/AAD hx Watchman implant 08/03/20  Studies Reviewed: Marland Kitchen    EKG not done today  September 26, 2020 transesophageal echo personally reviewed 1. 31 mm Watchman FLX (08/03/2020). Well positioned device with shoulder  noted in the 3 o'clock position (directly above AMVL). Max 2D compression  25.6 mm (~18% compression). No device related thrombus. No device leak  detected. Normal Watchman FLX  device. Left atrial size was moderately dilated. No left atrial/left  atrial appendage thrombus was detected.   2. Left ventricular ejection fraction, by estimation, is 60 to 65%. The  left ventricle has normal function. The left ventricle has no regional  wall motion abnormalities.   3. Right ventricular systolic function is low normal. The right  ventricular size is mildly enlarged.   4. Right atrial size was mild to moderately dilated.   5. Mild MR. PISA radius 0.4 cm, ERO 0.09 cm2, R vol 15 cc. The mitral  valve is normal in structure. Mild mitral valve regurgitation. No evidence  of mitral stenosis.   6. The aortic valve is tricuspid.  Aortic valve regurgitation is not  visualized. No aortic stenosis is present.   7. There is mild (Grade II) layered plaque involving the transverse aorta  and descending aorta.    Cardiac CTA 07/17/2020: IMPRESSION: 1. The left atrial appendage is large chicken wing type with two lobes that is directed anteriorly.   2. A 31 mm watchman FLX device is recommended based on the above landing zone measurements (25.4 mm maximum diameter; 18% compression).   3. There is no thrombus in the left atrial appendage on delayed imaging.   4. A mid and mid IAS puncture site is recommended. An anterior curve catheter is recommended.   5. Optimal deployment angle: RAO 1 CRA 1   6. Normal coronary origin. Right dominance. CAC score of 976, which is 75th percentile for age- and sex-matched controls.     Risk Assessment/Calculations:    Physical Exam:   VS:  There were no vitals taken for this visit.   Wt Readings from Last 3 Encounters:  05/23/22 226 lb 13.7 oz (102.9 kg)  04/15/22 231 lb 12.8 oz (105.1 kg)  05/21/21 230 lb (104.3 kg)    GEN: Well nourished, well developed in no acute distress NECK: No JVD; No carotid bruits CARDIAC: ***RRR, no murmurs, rubs, gallops RESPIRATORY:  *** CTA b/l without rales, wheezing or rhonchi  ABDOMEN: Soft, non-tender, non-distended EXTREMITIES:  No edema; No deformity   PPM/ICD/ILR site: *** is stable, no thinning, fluctuation, tethering  ASSESSMENT AND PLAN: .  permanent AFib CHA2DS2Vasc is 6 >> off a/c s/p watchman *** rate controlled       {Are you ordering a CV Procedure (e.g. stress test, cath, DCCV, TEE, etc)?   Press F2        :[id number]}     Dispo: ***  Signed, Sheilah Pigeon, PA-C

## 2022-12-03 ENCOUNTER — Encounter: Payer: Self-pay | Admitting: Physician Assistant

## 2022-12-03 ENCOUNTER — Ambulatory Visit: Payer: Medicare PPO | Attending: Physician Assistant | Admitting: Physician Assistant

## 2022-12-03 VITALS — BP 124/78 | HR 61 | Ht 71.0 in | Wt 232.4 lb

## 2022-12-03 DIAGNOSIS — I4821 Permanent atrial fibrillation: Secondary | ICD-10-CM | POA: Diagnosis not present

## 2022-12-03 NOTE — Patient Instructions (Signed)
Medication Instructions:   Your physician recommends that you continue on your current medications as directed. Please refer to the Current Medication list given to you today.   *If you need a refill on your cardiac medications before your next appointment, please call your pharmacy*   Lab Work: NONE ORDERED  TODAY   If you have labs (blood work) drawn today and your tests are completely normal, you will receive your results only by: MyChart Message (if you have MyChart) OR A paper copy in the mail If you have any lab test that is abnormal or we need to change your treatment, we will call you to review the results.   Testing/Procedures: NONE ORDERED  TODAY     Follow-Up: At [facility], you and your health needs are our priority.  As part of our continuing mission to provide you with exceptional heart care, we have created designated Provider Care Teams.  These Care Teams include your primary Cardiologist (physician) and Advanced Practice Providers (APPs -  Physician Assistants and Nurse Practitioners) who all work together to provide you with the care you need, when you need it.  We recommend signing up for the patient portal called "MyChart".  Sign up information is provided on this After Visit Summary.  MyChart is used to connect with patients for Virtual Visits (Telemedicine).  Patients are able to view lab/test results, encounter notes, upcoming appointments, etc.  Non-urgent messages can be sent to your provider as well.   To learn more about what you can do with MyChart, go to [email or web address].    Your next appointment:  CONTACT [facility] [phone] AS NEEDED FOR  ANY CARDIAC RELATED SYMPTOMS   Provider:   Armanda Magic, MD     Other Instructions

## 2022-12-13 DIAGNOSIS — H401131 Primary open-angle glaucoma, bilateral, mild stage: Secondary | ICD-10-CM | POA: Diagnosis not present

## 2023-02-14 ENCOUNTER — Ambulatory Visit: Payer: Medicare PPO | Admitting: Cardiology

## 2023-04-22 ENCOUNTER — Ambulatory Visit: Payer: Medicare PPO | Attending: Cardiology | Admitting: Cardiology

## 2023-04-22 ENCOUNTER — Telehealth: Payer: Self-pay | Admitting: *Deleted

## 2023-04-22 ENCOUNTER — Encounter: Payer: Self-pay | Admitting: Cardiology

## 2023-04-22 VITALS — BP 116/72 | HR 52 | Ht 71.0 in | Wt 245.0 lb

## 2023-04-22 DIAGNOSIS — I4821 Permanent atrial fibrillation: Secondary | ICD-10-CM

## 2023-04-22 DIAGNOSIS — R400 Somnolence: Secondary | ICD-10-CM | POA: Diagnosis not present

## 2023-04-22 DIAGNOSIS — E7800 Pure hypercholesterolemia, unspecified: Secondary | ICD-10-CM | POA: Diagnosis not present

## 2023-04-22 DIAGNOSIS — I2510 Atherosclerotic heart disease of native coronary artery without angina pectoris: Secondary | ICD-10-CM | POA: Diagnosis not present

## 2023-04-22 DIAGNOSIS — I77810 Thoracic aortic ectasia: Secondary | ICD-10-CM

## 2023-04-22 DIAGNOSIS — I10 Essential (primary) hypertension: Secondary | ICD-10-CM | POA: Diagnosis not present

## 2023-04-22 MED ORDER — METOPROLOL TARTRATE 25 MG PO TABS: 12.5000 mg | ORAL_TABLET | Freq: Two times a day (BID) | ORAL | 3 refills | Status: AC

## 2023-04-22 NOTE — Addendum Note (Signed)
Addended by: Luellen Pucker on: 04/22/2023 11:15 AM   Modules accepted: Orders

## 2023-04-22 NOTE — Progress Notes (Signed)
Date:  04/22/2023   ID:  Norman [name], DOB 1945-10-09, MRN [id number]   PCP:  Tally Joe, MD  Cardiologist:  Armanda Magic, MD  Electrophysiologist:  Lanier Prude, MD   Chief Complaint:  Afib, CAD, HTN, HLD  History of Present Illness:    Norman [name] is a 78 y.o. male with a hx of permanent atrial fibrillation, carotid artery stenosis, coronary artery calcifications by CT, hyperlipidemia and dilated aortic root and HTN.    He is here today for followup and is doing well.  He tells me that 4 months ago started having decreased energy but no other associated symptoms.  He had had COVID 1 month prior to the onset of the symptoms. He denies any chest pain or pressure, SOB, DOE, PND, orthopnea, LE edema, dizziness, palpitations or syncope. H is compliant with his meds and is tolerating meds with no SE.    Prior CV studies:   The following studies were reviewed today: None  Past Medical History:  Diagnosis Date   Anxiety    Blood in urine 2 1/2 WEEKS AGO   BPH (benign prostatic hypertrophy)    Mild   Coronary artery calcification seen on CAT scan 2011   no ischemia on nuclear stress test 2011   Dilated aortic root (HCC)    37mm by echo 2017   Dysrhythmia    afib   Epididymal cyst 2005   Bilateral and small bilateral hydroceles with most recent ultra sound   Glaucoma    History of kidney stones    Hypercholesteremia    Hypertension    Kidney stones    "I've passed them all"   Migraine    "got off caffeine; they went away"   Permanent atrial fibrillation (HCC)    Stroke [facility])    TIA ? - 2009    Tendinitis    History in Rotator cuff   TIA (transient ischemic attack) ?12/28/2013   Type II diabetes mellitus (HCC)    Past Surgical History:  Procedure Laterality Date   CARDIOVERSION  X 1   CYSTOSCOPY WITH LITHOLAPAXY N/A 12/05/2017   Procedure: CYSTOSCOPY WITH LITHOLAPAXY;  Surgeon: Ihor Gully, MD;  Location: [facility];  Service: Urology;   Laterality: N/A;   CYSTOSCOPY WITH RETROGRADE PYELOGRAM, URETEROSCOPY AND STENT PLACEMENT Right 08/18/2015   Procedure: CYSTOSCOPY WITH RIGHT  RETROGRADE PYELOGRAM, RIGHT URETEROGRAM.;  Surgeon: Ihor Gully, MD;  Location: [facility];  Service: Urology;  Laterality: Right;   LEFT ATRIAL APPENDAGE OCCLUSION N/A 08/03/2020   Procedure: LEFT ATRIAL APPENDAGE OCCLUSION;  Surgeon: Lanier Prude, MD;  Location: [facility] INVASIVE CV LAB;  Service: Cardiovascular;  Laterality: N/A;   TEE WITHOUT CARDIOVERSION N/A 08/03/2020   Procedure: TRANSESOPHAGEAL ECHOCARDIOGRAM (TEE);  Surgeon: Lanier Prude, MD;  Location: [facility] INVASIVE CV LAB;  Service: Cardiovascular;  Laterality: N/A;   TEE WITHOUT CARDIOVERSION N/A 09/26/2020   Procedure: TRANSESOPHAGEAL ECHOCARDIOGRAM (TEE);  Surgeon: Sande Rives, MD;  Location: [facility];  Service: Cardiovascular;  Laterality: N/A;   TONSILLECTOMY     TOTAL ANKLE ARTHROPLASTY Right 05/23/2022   Procedure: TOTAL ANKLE ARTHROPLASTY, possible heel cord lengthening;  Surgeon: Toni Arthurs, MD;  Location: [facility];  Service: Orthopedics;  Laterality: Right;     Current Meds  Medication Sig   aspirin EC 81 MG tablet Take 81 mg by mouth daily. Swallow whole.   atorvastatin (LIPITOR) 80 MG tablet Take 80 mg by mouth every evening.  brimonidine-timolol (COMBIGAN) 0.2-0.5 % ophthalmic solution Place 1 drop into both eyes in the morning and at bedtime.   chlorthalidone (HYGROTON) 25 MG tablet Take 12.5 mg by mouth daily.    diltiazem (TIAZAC) 240 MG 24 hr capsule Take 240 mg by mouth daily.   ezetimibe (ZETIA) 10 MG tablet TAKE ONE TABLET BY MOUTH ONE TIME DAILY   latanoprost (XALATAN) 0.005 % ophthalmic solution Place 1 drop into both eyes at bedtime.   LUMIGAN 0.01 % SOLN Place 1 drop into both eyes at bedtime.   metFORMIN (GLUCOPHAGE) 500 MG tablet Take 500 mg by mouth daily with breakfast.   metoprolol tartrate (LOPRESSOR) 25 MG tablet Take 25 mg by  mouth 2 (two) times daily.   tamsulosin (FLOMAX) 0.4 MG CAPS capsule Take 0.4 mg by mouth daily.   valsartan (DIOVAN) 320 MG tablet Take 320 mg by mouth daily.   zolpidem (AMBIEN CR) 12.5 MG CR tablet Take 12.5 mg by mouth at bedtime.     Allergies:   Patient has no known allergies.   Social History   Tobacco Use   Smoking status: Never   Smokeless tobacco: Never  Vaping Use   Vaping status: Never Used  Substance Use Topics   Alcohol use: Yes    Alcohol/week: 1.0 standard drink of alcohol    Types: 1 Glasses of wine per week    Comment: occ   Drug use: No     Family Hx: The patient's family history includes Diabetes in his maternal aunt; Heart attack in his father; Kidney failure in his mother.  ROS:   Please see the history of present illness.     All other systems reviewed and are negative.   Labs/Other Tests and Data Reviewed:    EKG Interpretation Date/Time:  Tuesday April 22 2023 10:54:05 EST Ventricular Rate:  52 PR Interval:    QRS Duration:  92 QT Interval:  428 QTC Calculation: 398 R Axis:   62  Text Interpretation: Atrial fibrillation with slow ventricular response Septal infarct , age undetermined When compared with ECG of 04-Aug-2020 07:12, Septal infarct is now Present Confirmed by Armanda Magic ([id number]) on 04/22/2023 11:00:02 AM    Recent Labs: 05/17/2022: BUN 13; Creatinine, Ser 1.20; Potassium 4.1; Sodium 128   Recent Lipid Panel Lab Results  Component Value Date/Time   CHOL 104 01/06/2019 08:01 AM   TRIG 83 01/06/2019 08:01 AM   HDL 32 (L) 01/06/2019 08:01 AM   CHOLHDL 3.3 01/06/2019 08:01 AM   CHOLHDL 3.6 12/29/2013 04:00 AM   LDLCALC 55 01/06/2019 08:01 AM    Wt Readings from Last 3 Encounters:  04/22/23 245 lb (111.1 kg)  12/03/22 232 lb 6.4 oz (105.4 kg)  05/23/22 226 lb 13.7 oz (102.9 kg)     Objective:    Vital Signs:  BP 116/72 (BP Location: Left Arm, Patient Position: Sitting, Cuff Size: Large)   Pulse (!) 52   Ht 5\' 11"   (1.803 m)   Wt 245 lb (111.1 kg)   SpO2 96%   BMI 34.17 kg/m   GEN: Well nourished, well developed in no acute distress HEENT: Normal NECK: No JVD; No carotid bruits LYMPHATICS: No lymphadenopathy CARDIAC:irregularly irregular, no murmurs, rubs, gallops RESPIRATORY:  Clear to auscultation without rales, wheezing or rhonchi  ABDOMEN: Soft, non-tender, non-distended MUSCULOSKELETAL:  trace RLE edema; No deformity  SKIN: Warm and dry NEUROLOGIC:  Alert and oriented x 3 PSYCHIATRIC:  Normal affect  ASSESSMENT & PLAN:    1.  Permanent atrial fibrillation  -Heart rate adequately controlled.  He denies any palpitations -Status post Watchman device  and no longer on DOAC -given his fatigue (likely related to post COVID) and HR in the 50's I will decrease Lopressor to 12.5mg  BID -Continue drug management with Cardizem CD 240 mg daily with as needed refills  2.  Hypertension  -BP controlled on exam today -Continue prescription drug management with chlorthalidone 12.5 mg daily, Cardizem CD 240 mg daily, Lopressor 12.5 mg twice daily and valsartan 320 mg daily with as needed refills -check BMET  3.  Dilated ascending aorta  - this was normal on last echo 2017  4.  Coronary artery calcifications  -this was noted on prior Chest CT.   -2D echo 10/2018 with normal LVF  -Lexiscan myoview with no ischemia 10/2018 -He denies any anginal symptoms -Continue prescription drug managed with aspirin 81 mg daily, atorvastatin 80 mg daily and decrease Lopressor to 12.5 mg twice daily with as needed refills  5.  Hyperlipidemia  - his LDL goal is < 70 due to coronary artery calcification since -I have personally reviewed and interpreted outside labs performed by patient's PCP which showed LDL 44, HDL 35 and ALT 31 on 10/10/2022 -Continue prescription drug management with atorvastatin 80 mg daily, Zetia 10 mg daily with as needed refills  6.  Daytime fatigue -he has never had a sleep study -possibly  related to post COVID -his wife has noticed that he stops breathing some in sleep but more when he was over 300lbs -I will get a HST to assess for OSA  Medication Adjustments/Labs and Tests Ordered: Current medicines are reviewed at length with the patient today.  Concerns regarding medicines are outlined above.  Tests Ordered: Orders Placed This Encounter  Procedures   EKG 12-Lead   Medication Changes: No orders of the defined types were placed in this encounter.   Disposition:  Follow up 1 year  Signed, Armanda Magic, MD  04/22/2023 11:00 AM    [facility]

## 2023-04-22 NOTE — Patient Instructions (Signed)
Medication Instructions:  Please DECREASE your dose of lopressor (metoprolol tartrate) to 12.5 mg twice a day.   *If you need a refill on your cardiac medications before your next appointment, please call your pharmacy*   Lab Work: None.  If you have labs (blood work) drawn today and your tests are completely normal, you will receive your results only by: MyChart Message (if you have MyChart) OR A paper copy in the mail If you have any lab test that is abnormal or we need to change your treatment, we will call you to review the results.   Testing/Procedures: Your physician has recommended that you have a home sleep study. This test records several body functions during sleep, including: brain activity, eye movement, oxygen and carbon dioxide blood levels, heart rate and rhythm, breathing rate and rhythm, the flow of air through your mouth and nose, snoring, body muscle movements, and chest and belly movement.    Follow-Up:   Your next appointment:   1 year(s)  Provider:   Armanda Magic, MD

## 2023-04-22 NOTE — Telephone Encounter (Signed)
DR. Derl Barrow  Patient agreement reviewed and signed on 04/22/2023.  WatchPAT issued to patient on 04/22/2023 by Danielle Rankin, CMA. Patient aware to not open the WatchPAT box until contacted with the activation PIN. Patient profile initialized in CloudPAT on 04/22/2023 by Danielle Rankin, CMA. Device serial number: [id number]  Please list Reason for Call as Advice Only and type "WatchPAT issued to patient" in the comment box.

## 2023-05-13 DIAGNOSIS — G4700 Insomnia, unspecified: Secondary | ICD-10-CM | POA: Diagnosis not present

## 2023-05-13 DIAGNOSIS — M19079 Primary osteoarthritis, unspecified ankle and foot: Secondary | ICD-10-CM | POA: Diagnosis not present

## 2023-05-13 DIAGNOSIS — E1169 Type 2 diabetes mellitus with other specified complication: Secondary | ICD-10-CM | POA: Diagnosis not present

## 2023-05-13 DIAGNOSIS — I4891 Unspecified atrial fibrillation: Secondary | ICD-10-CM | POA: Diagnosis not present

## 2023-05-13 DIAGNOSIS — E782 Mixed hyperlipidemia: Secondary | ICD-10-CM | POA: Diagnosis not present

## 2023-05-13 DIAGNOSIS — Z0000 Encounter for general adult medical examination without abnormal findings: Secondary | ICD-10-CM | POA: Diagnosis not present

## 2023-05-13 DIAGNOSIS — Z8673 Personal history of transient ischemic attack (TIA), and cerebral infarction without residual deficits: Secondary | ICD-10-CM | POA: Diagnosis not present

## 2023-05-13 DIAGNOSIS — I10 Essential (primary) hypertension: Secondary | ICD-10-CM | POA: Diagnosis not present

## 2023-06-10 DIAGNOSIS — H401131 Primary open-angle glaucoma, bilateral, mild stage: Secondary | ICD-10-CM | POA: Diagnosis not present

## 2023-06-10 DIAGNOSIS — E119 Type 2 diabetes mellitus without complications: Secondary | ICD-10-CM | POA: Diagnosis not present

## 2023-06-10 DIAGNOSIS — H52203 Unspecified astigmatism, bilateral: Secondary | ICD-10-CM | POA: Diagnosis not present

## 2023-06-10 DIAGNOSIS — Z961 Presence of intraocular lens: Secondary | ICD-10-CM | POA: Diagnosis not present

## 2023-06-27 DIAGNOSIS — N401 Enlarged prostate with lower urinary tract symptoms: Secondary | ICD-10-CM | POA: Diagnosis not present

## 2023-07-01 ENCOUNTER — Telehealth: Payer: Self-pay

## 2023-07-01 NOTE — Telephone Encounter (Signed)
**Note De-Identified Norman [name] Obfuscation** The pt called me back and I did provide him with the [facility] One-HST Device Pin # of "[id number]". He verbalized understanding.

## 2023-07-01 NOTE — Telephone Encounter (Signed)
**Note De-Identified Marygrace Sandoval Obfuscation** Ordering provider: Dr Mayford Knife Associated diagnoses: Snoring-R06.83 and Somnolence-R40.0  WatchPAT PA obtained on 07/01/2023 by Elmer Merwin, Lorelle Formosa, LPN. Authorization: Per the [employer] provider portal a PA is not required for CPT Code: 16109.  Patient notified of PIN (1234) on 07/01/2023 Desirie Minteer Notification Method: MyChart message. I also called the pt to provide him with the Pin # but got no answer so I left a message on his VM advising him that I have sent him a [facility] message and that if he has any questions or concerns to contact us. There is no DPR on file for him.  Phone note routed to covering staff for follow-up.

## 2023-07-04 DIAGNOSIS — R3915 Urgency of urination: Secondary | ICD-10-CM | POA: Diagnosis not present

## 2023-07-04 DIAGNOSIS — N200 Calculus of kidney: Secondary | ICD-10-CM | POA: Diagnosis not present

## 2023-07-04 DIAGNOSIS — R3912 Poor urinary stream: Secondary | ICD-10-CM | POA: Diagnosis not present

## 2023-07-04 DIAGNOSIS — N401 Enlarged prostate with lower urinary tract symptoms: Secondary | ICD-10-CM | POA: Diagnosis not present

## 2023-07-16 NOTE — Telephone Encounter (Signed)
 Left voice message to call back 4/23

## 2023-07-17 NOTE — Telephone Encounter (Signed)
 Spoke with pt regarding his [place] home sleep study. Pt stated he has not yet completed the study but plans to do so this weekend.

## 2023-07-21 ENCOUNTER — Encounter (INDEPENDENT_AMBULATORY_CARE_PROVIDER_SITE_OTHER): Payer: Self-pay | Admitting: Cardiology

## 2023-07-21 DIAGNOSIS — G4733 Obstructive sleep apnea (adult) (pediatric): Secondary | ICD-10-CM

## 2023-07-22 ENCOUNTER — Ambulatory Visit: Attending: Cardiology

## 2023-07-22 DIAGNOSIS — R400 Somnolence: Secondary | ICD-10-CM

## 2023-07-22 NOTE — Procedures (Signed)
   SLEEP STUDY REPORT Patient Information Study Date: 07/21/2023 Patient Name: Norman [name] Patient ID: [id number] Birth Date: 12-13-1945 Age: 78 Gender: Male BMI: 34.3 (W=244 lb, H=5' 11'') Stopbang: 5 Referring Physician: Gaylyn Keas, MD  TEST DESCRIPTION: Home sleep apnea testing was completed using the WatchPat, a Type 1 device, utilizing peripheral arterial tonometry (PAT), chest movement, actigraphy, pulse oximetry, pulse rate, body position and snore. AHI was calculated with apnea and hypopnea using valid sleep time as the denominator. RDI includes apneas, hypopneas, and RERAs. The data acquired and the scoring of sleep and all associated events were performed in accordance with the recommended standards and specifications as outlined in the AASM Manual for the Scoring of Sleep and Associated Events 2.2.0 (2015).  FINDINGS:  1. Mild to moderate Obstructive Sleep Apnea with AHI 14.3/hr.  2. No Central Sleep Apnea with pAHIc 0.3hr.  3. Oxygen desaturations as low as 86%.  4. Mild to moderate snoring was present. O2 sats were < 88% for 3.6 min.  5. Total sleep time was 3 hrs and 19 min.  6. 12.1% of total sleep time was spent in REM sleep.  7. Shortened sleep onset latency at 6 min.  8. Shortened REM sleep onset latency at 32 min.  9. Total awakenings were 16. 10. Arrhythmia detection: Suggestive of possible brief atrial fibrillation lasting 4 hours and 24 seconds. This is not diagnostic and further testing with outpatient telemetry monitoring is recommended.  DIAGNOSIS: Mild to Moderate Obstructive Sleep Apnea (G47.33)  Possible Atrial Fibrillation RECOMMENDATIONS: 1. Clinical correlation of these findings is necessary. The decision to treat obstructive sleep apnea (OSA) is usually based on the presence of apnea symptoms or the presence of associated medical conditions such as Hypertension, Congestive Heart Failure, Atrial Fibrillation or Obesity. The most common symptoms  of OSA are snoring, gasping for breath while sleeping, daytime sleepiness and fatigue. 2. Initiating apnea therapy is recommended given the presence of symptoms and/or associated conditions. Recommend proceeding with one of the following:  a. Auto-CPAP therapy with a pressure range of 5-20cm H2O.  b. An oral appliance (OA) that can be obtained from certain dentists with expertise in sleep medicine. These are primarily of use in non-obese patients with mild and moderate disease.  c. An ENT consultation which may be useful to look for specific causes of obstruction and possible treatment options.  d. If patient is intolerant to PAP therapy, consider referral to ENT for evaluation for hypoglossal nerve stimulator. 3. Close follow-up is necessary to ensure success with CPAP or oral appliance therapy for maximum benefit . 4. A follow-up oximetry study on CPAP is recommended to assess the adequacy of therapy and determine the need for supplemental oxygen or the potential need for Bi-level therapy. An arterial blood gas to determine the adequacy of baseline ventilation and oxygenation should also be considered. 5. Healthy sleep recommendations include: adequate nightly sleep (normal 7-9 hrs/night), avoidance of caffeine after noon and alcohol near bedtime, and maintaining a sleep environment that is cool, dark and quiet. 6. Weight loss for overweight patients is recommended. Even modest amounts of weight loss can significantly improve the severity of sleep apnea. 7. Snoring recommendations include: weight loss where appropriate, side sleeping, and avoidance of alcohol before bed. 8. Operation of motor vehicle should be avoided when sleepy.  Signature: Gaylyn Keas, MD; [facility]; Diplomat, American Board of Sleep Medicine Electronically Signed: 07/22/2023 8:39:38 PM

## 2023-07-23 ENCOUNTER — Telehealth: Payer: Self-pay

## 2023-07-23 NOTE — Telephone Encounter (Signed)
-----   Message from Gaylyn Keas sent at 07/22/2023  8:41 PM EDT ----- Please let patient know that they have sleep apnea.  Recommend therapeutic CPAP titration for treatment of patient's sleep disordered breathing.

## 2023-07-23 NOTE — Telephone Encounter (Signed)
 Patient returned RN's call regarding results.

## 2023-07-23 NOTE — Telephone Encounter (Signed)
 Left VM with callback number for patient to receive sleep study results and recommendations.

## 2023-07-24 NOTE — Telephone Encounter (Signed)
 Pt returning call to a nurse for results

## 2023-08-06 NOTE — Telephone Encounter (Signed)
 Left VM with callback number for patient to receive sleep study results and recommendations.

## 2023-08-07 NOTE — Telephone Encounter (Signed)
 Pt is returning call to sleep study. He was told to c/b at [phone] but says no one answer.

## 2023-08-13 NOTE — Telephone Encounter (Signed)
 The patient has been notified of the result and verbalized understanding.  All questions (if any) were answered. Gaylene Kays, CMA 08/13/2023 12:09 PM     Patient states he does not want a cpap machine but would prefer to first try to loose the weight. Patient is aware that his test is good for one year.

## 2023-09-18 DIAGNOSIS — M542 Cervicalgia: Secondary | ICD-10-CM | POA: Diagnosis not present

## 2023-09-18 DIAGNOSIS — M545 Low back pain, unspecified: Secondary | ICD-10-CM | POA: Diagnosis not present

## 2023-10-08 DIAGNOSIS — M47892 Other spondylosis, cervical region: Secondary | ICD-10-CM | POA: Diagnosis not present

## 2023-10-24 DIAGNOSIS — M47892 Other spondylosis, cervical region: Secondary | ICD-10-CM | POA: Diagnosis not present

## 2023-11-11 DIAGNOSIS — G4700 Insomnia, unspecified: Secondary | ICD-10-CM | POA: Diagnosis not present

## 2023-11-11 DIAGNOSIS — N401 Enlarged prostate with lower urinary tract symptoms: Secondary | ICD-10-CM | POA: Diagnosis not present

## 2023-11-11 DIAGNOSIS — I4891 Unspecified atrial fibrillation: Secondary | ICD-10-CM | POA: Diagnosis not present

## 2023-11-11 DIAGNOSIS — R5383 Other fatigue: Secondary | ICD-10-CM | POA: Diagnosis not present

## 2023-11-11 DIAGNOSIS — I69398 Other sequelae of cerebral infarction: Secondary | ICD-10-CM | POA: Diagnosis not present

## 2023-11-11 DIAGNOSIS — E782 Mixed hyperlipidemia: Secondary | ICD-10-CM | POA: Diagnosis not present

## 2023-11-11 DIAGNOSIS — M19079 Primary osteoarthritis, unspecified ankle and foot: Secondary | ICD-10-CM | POA: Diagnosis not present

## 2023-11-11 DIAGNOSIS — Z8673 Personal history of transient ischemic attack (TIA), and cerebral infarction without residual deficits: Secondary | ICD-10-CM | POA: Diagnosis not present

## 2023-11-11 DIAGNOSIS — I10 Essential (primary) hypertension: Secondary | ICD-10-CM | POA: Diagnosis not present

## 2023-11-11 DIAGNOSIS — E1169 Type 2 diabetes mellitus with other specified complication: Secondary | ICD-10-CM | POA: Diagnosis not present

## 2023-11-18 DIAGNOSIS — M47892 Other spondylosis, cervical region: Secondary | ICD-10-CM | POA: Diagnosis not present

## 2023-11-26 DIAGNOSIS — Z1211 Encounter for screening for malignant neoplasm of colon: Secondary | ICD-10-CM | POA: Diagnosis not present

## 2023-11-26 DIAGNOSIS — I4821 Permanent atrial fibrillation: Secondary | ICD-10-CM | POA: Diagnosis not present

## 2023-12-10 DIAGNOSIS — H401131 Primary open-angle glaucoma, bilateral, mild stage: Secondary | ICD-10-CM | POA: Diagnosis not present

## 2023-12-26 DIAGNOSIS — K648 Other hemorrhoids: Secondary | ICD-10-CM | POA: Diagnosis not present

## 2023-12-26 DIAGNOSIS — K644 Residual hemorrhoidal skin tags: Secondary | ICD-10-CM | POA: Diagnosis not present

## 2023-12-26 DIAGNOSIS — Z1211 Encounter for screening for malignant neoplasm of colon: Secondary | ICD-10-CM | POA: Diagnosis not present

## 2023-12-26 DIAGNOSIS — D120 Benign neoplasm of cecum: Secondary | ICD-10-CM | POA: Diagnosis not present

## 2023-12-26 DIAGNOSIS — K5730 Diverticulosis of large intestine without perforation or abscess without bleeding: Secondary | ICD-10-CM | POA: Diagnosis not present

## 2023-12-26 DIAGNOSIS — D122 Benign neoplasm of ascending colon: Secondary | ICD-10-CM | POA: Diagnosis not present

## 2023-12-30 DIAGNOSIS — D120 Benign neoplasm of cecum: Secondary | ICD-10-CM | POA: Diagnosis not present

## 2023-12-30 DIAGNOSIS — D122 Benign neoplasm of ascending colon: Secondary | ICD-10-CM | POA: Diagnosis not present

## 2024-01-27 DIAGNOSIS — L820 Inflamed seborrheic keratosis: Secondary | ICD-10-CM | POA: Diagnosis not present

## 2024-01-27 DIAGNOSIS — L821 Other seborrheic keratosis: Secondary | ICD-10-CM | POA: Diagnosis not present

## 2024-01-27 DIAGNOSIS — D225 Melanocytic nevi of trunk: Secondary | ICD-10-CM | POA: Diagnosis not present

## 2024-01-27 DIAGNOSIS — Z1283 Encounter for screening for malignant neoplasm of skin: Secondary | ICD-10-CM | POA: Diagnosis not present

## 2024-01-27 DIAGNOSIS — X32XXXD Exposure to sunlight, subsequent encounter: Secondary | ICD-10-CM | POA: Diagnosis not present

## 2024-01-27 DIAGNOSIS — L570 Actinic keratosis: Secondary | ICD-10-CM | POA: Diagnosis not present

## 2024-03-05 ENCOUNTER — Encounter: Payer: Self-pay | Admitting: Cardiology

## 2024-05-17 ENCOUNTER — Ambulatory Visit: Admitting: Cardiology
